# Patient Record
Sex: Female | Born: 1945 | Race: White | Hispanic: No | Smoking: Never smoker
Health system: Southern US, Community
[De-identification: ages and names within clinical notes are randomized; demographics above are authoritative.]

## PROBLEM LIST (undated history)

## (undated) DIAGNOSIS — G8929 Other chronic pain: Secondary | ICD-10-CM

## (undated) DIAGNOSIS — Z9889 Other specified postprocedural states: Secondary | ICD-10-CM

## (undated) DIAGNOSIS — Z8619 Personal history of other infectious and parasitic diseases: Secondary | ICD-10-CM

## (undated) DIAGNOSIS — M810 Age-related osteoporosis without current pathological fracture: Secondary | ICD-10-CM

## (undated) DIAGNOSIS — R351 Nocturia: Secondary | ICD-10-CM

## (undated) DIAGNOSIS — K219 Gastro-esophageal reflux disease without esophagitis: Secondary | ICD-10-CM

## (undated) DIAGNOSIS — R350 Frequency of micturition: Secondary | ICD-10-CM

## (undated) DIAGNOSIS — J449 Chronic obstructive pulmonary disease, unspecified: Secondary | ICD-10-CM

## (undated) DIAGNOSIS — T4145XA Adverse effect of unspecified anesthetic, initial encounter: Secondary | ICD-10-CM

## (undated) DIAGNOSIS — N390 Urinary tract infection, site not specified: Secondary | ICD-10-CM

## (undated) DIAGNOSIS — M2550 Pain in unspecified joint: Secondary | ICD-10-CM

## (undated) DIAGNOSIS — IMO0001 Reserved for inherently not codable concepts without codable children: Secondary | ICD-10-CM

## (undated) DIAGNOSIS — R112 Nausea with vomiting, unspecified: Secondary | ICD-10-CM

## (undated) DIAGNOSIS — M2540 Effusion, unspecified joint: Secondary | ICD-10-CM

## (undated) DIAGNOSIS — J302 Other seasonal allergic rhinitis: Secondary | ICD-10-CM

## (undated) DIAGNOSIS — J189 Pneumonia, unspecified organism: Secondary | ICD-10-CM

## (undated) DIAGNOSIS — I10 Essential (primary) hypertension: Secondary | ICD-10-CM

## (undated) DIAGNOSIS — M549 Dorsalgia, unspecified: Secondary | ICD-10-CM

## (undated) DIAGNOSIS — R0602 Shortness of breath: Secondary | ICD-10-CM

## (undated) DIAGNOSIS — Z5189 Encounter for other specified aftercare: Secondary | ICD-10-CM

## (undated) DIAGNOSIS — H9190 Unspecified hearing loss, unspecified ear: Secondary | ICD-10-CM

## (undated) DIAGNOSIS — M1990 Unspecified osteoarthritis, unspecified site: Secondary | ICD-10-CM

## (undated) DIAGNOSIS — R42 Dizziness and giddiness: Secondary | ICD-10-CM

## (undated) DIAGNOSIS — D219 Benign neoplasm of connective and other soft tissue, unspecified: Secondary | ICD-10-CM

## (undated) DIAGNOSIS — L853 Xerosis cutis: Secondary | ICD-10-CM

## (undated) DIAGNOSIS — T8859XA Other complications of anesthesia, initial encounter: Secondary | ICD-10-CM

## (undated) DIAGNOSIS — H269 Unspecified cataract: Secondary | ICD-10-CM

## (undated) DIAGNOSIS — E785 Hyperlipidemia, unspecified: Secondary | ICD-10-CM

---

## 1977-07-25 DIAGNOSIS — Z5189 Encounter for other specified aftercare: Secondary | ICD-10-CM

## 1977-07-25 DIAGNOSIS — IMO0001 Reserved for inherently not codable concepts without codable children: Secondary | ICD-10-CM

## 1977-07-25 HISTORY — DX: Reserved for inherently not codable concepts without codable children: IMO0001

## 1977-07-25 HISTORY — PX: DILATION AND CURETTAGE OF UTERUS: SHX78

## 1977-07-25 HISTORY — DX: Encounter for other specified aftercare: Z51.89

## 2000-01-28 ENCOUNTER — Other Ambulatory Visit: Admission: RE | Admit: 2000-01-28 | Discharge: 2000-01-28 | Payer: Self-pay | Admitting: Family Medicine

## 2001-08-14 ENCOUNTER — Other Ambulatory Visit: Admission: RE | Admit: 2001-08-14 | Discharge: 2001-08-14 | Payer: Self-pay | Admitting: Family Medicine

## 2010-12-24 HISTORY — PX: BACK SURGERY: SHX140

## 2010-12-31 ENCOUNTER — Encounter (HOSPITAL_COMMUNITY)
Admission: RE | Admit: 2010-12-31 | Discharge: 2010-12-31 | Disposition: A | Discharge status: Home / Self Care | Payer: 59 | Source: Ambulatory Visit | Attending: Neurosurgery | Admitting: Neurosurgery

## 2010-12-31 ENCOUNTER — Other Ambulatory Visit (HOSPITAL_COMMUNITY): Payer: Self-pay | Admitting: Neurosurgery

## 2010-12-31 DIAGNOSIS — M545 Low back pain, unspecified: Secondary | ICD-10-CM

## 2010-12-31 DIAGNOSIS — M5126 Other intervertebral disc displacement, lumbar region: Secondary | ICD-10-CM

## 2010-12-31 LAB — BASIC METABOLIC PANEL
Calcium: 9.5 mg/dL (ref 8.4–10.5)
GFR calc Af Amer: >60 mL/min (ref >60)
GFR calc non Af Amer: >60 mL/min (ref >60)
Potassium: 4.1 mEq/L (ref 3.5–5.1)
Sodium: 138 mEq/L (ref 135–145)

## 2010-12-31 LAB — SURGICAL PCR SCREEN: Staphylococcus aureus: NEGATIVE

## 2010-12-31 LAB — CBC
MCHC: 33.7 g/dL (ref 30.0–36.0)
RDW: 12.9 % (ref 11.5–15.5)

## 2011-01-04 ENCOUNTER — Ambulatory Visit (HOSPITAL_COMMUNITY): Payer: 59

## 2011-01-04 ENCOUNTER — Inpatient Hospital Stay (HOSPITAL_COMMUNITY)
Admission: RE | Admit: 2011-01-04 | Discharge: 2011-01-05 | DRG: 491 | Disposition: A | Discharge status: Home / Self Care | Payer: 59 | Source: Ambulatory Visit | Attending: Neurosurgery | Admitting: Neurosurgery

## 2011-01-04 DIAGNOSIS — M5126 Other intervertebral disc displacement, lumbar region: Principal | ICD-10-CM | POA: Diagnosis present

## 2011-01-04 DIAGNOSIS — K219 Gastro-esophageal reflux disease without esophagitis: Secondary | ICD-10-CM | POA: Diagnosis present

## 2011-01-04 DIAGNOSIS — I10 Essential (primary) hypertension: Secondary | ICD-10-CM | POA: Diagnosis present

## 2011-01-04 DIAGNOSIS — M216X9 Other acquired deformities of unspecified foot: Secondary | ICD-10-CM | POA: Diagnosis present

## 2011-01-04 DIAGNOSIS — J45909 Unspecified asthma, uncomplicated: Secondary | ICD-10-CM | POA: Diagnosis present

## 2011-01-04 DIAGNOSIS — E119 Type 2 diabetes mellitus without complications: Secondary | ICD-10-CM | POA: Diagnosis present

## 2011-01-04 DIAGNOSIS — E7800 Pure hypercholesterolemia, unspecified: Secondary | ICD-10-CM | POA: Diagnosis present

## 2011-01-04 DIAGNOSIS — Z01818 Encounter for other preprocedural examination: Secondary | ICD-10-CM

## 2011-01-04 LAB — GLUCOSE, CAPILLARY
Glucose-Capillary: 227 mg/dL — ABNORMAL HIGH (ref 70–99)
Glucose-Capillary: 219 mg/dL — ABNORMAL HIGH (ref 70–99)

## 2011-01-05 NOTE — Op Note (Signed)
  NAMEABILENE, Natalie Anderson                 ACCOUNT NO.:  [id number]  MEDICAL RECORD NO.:  [id number]  LOCATION:                                 FACILITY:  PHYSICIAN:  Hilda Lias, M.D.   DATE OF BIRTH:  1945/09/22  DATE OF PROCEDURE:  01/04/2011 DATE OF DISCHARGE:                              OPERATIVE REPORT   PREOPERATIVE DIAGNOSES:  Right L4-5 herniated disk with a footdrop, chronic obstructive pulmonary disease, diabetes.  POSTOPERATIVE DIAGNOSIS:  Right L4-5 herniated disk with a footdrop, chronic obstructive pulmonary disease, diabetes.  PROCEDURE:  Right L4-5 diskectomy, removal of large fragment, decompression of the L4-L5 nerve root, foraminotomy, microscope.  SURGEON:  Hilda Lias, MD  ASSISTANT:  Danae Orleans. Venetia Maxon, MD  CLINICAL HISTORY:  The patient is a 65 year old female complaining of back and right leg pain.  At this point, she had been followed for several weeks.  By the time I saw her, her pain was almost 0-1/5.  She declined surgery because her husband was in the ICU.  Now he is out of the ICU and she decided to go with surgery.  She and her niece knew the risks including the possibility of no improvement.  PROCEDURE:  The patient was taken to the OR after intubation.  She was positioned in prone manner.  The back was cleaned with DuraPrep. Midline incision from L4-L5 was made and muscle retracted laterally.  X- rays showed that indeed we were at the level of L4-5.  We brought the microscope and we drilled the lower lamina of L4 with the help of __ the microscope____.  We were able to find the thecal sac.  Retraction was done. Indeed there was a large herniated disk with extension into the foramen. The L5 nerve root was flat and pale.  The only way to get into the disk up into the fragment was to get into the disk space doing a total diskectomy.  From then on, we were able to retract the thecal sac as well as the L5 nerve root.  There were 2 large fragments  going to both L5 into the foramen.  Decompression was achieved at the end.  We had plenty of room for the thecal sac for the L4 and L5 nerve root.  The area was irrigated.  Fentanyl and Depo-Medrol were left in the intrathecal space and the wound was closed with Vicryl and Steri-Strips.          ______________________________ Hilda Lias, M.D.    EB/MEDQ  D:  01/04/2011  T:  01/05/2011  Job:  [id number]  Electronically Signed by Hilda Lias M.D. on 01/05/2011 03:52:45 PM

## 2011-04-29 ENCOUNTER — Telehealth (HOSPITAL_COMMUNITY): Payer: Self-pay

## 2011-04-29 NOTE — Telephone Encounter (Signed)
error 

## 2011-09-27 ENCOUNTER — Other Ambulatory Visit: Payer: Self-pay | Admitting: Neurosurgery

## 2011-09-27 DIAGNOSIS — M5410 Radiculopathy, site unspecified: Secondary | ICD-10-CM

## 2011-09-27 DIAGNOSIS — M549 Dorsalgia, unspecified: Secondary | ICD-10-CM

## 2011-10-04 ENCOUNTER — Ambulatory Visit
Admission: RE | Admit: 2011-10-04 | Discharge: 2011-10-04 | Disposition: A | Discharge status: Home / Self Care | Payer: Medicare Other | Source: Ambulatory Visit | Attending: Neurosurgery | Admitting: Neurosurgery

## 2011-10-04 DIAGNOSIS — M5410 Radiculopathy, site unspecified: Secondary | ICD-10-CM

## 2011-10-04 DIAGNOSIS — M549 Dorsalgia, unspecified: Secondary | ICD-10-CM

## 2011-10-04 MED ORDER — DIAZEPAM 5 MG PO TABS
5.0000 mg | ORAL_TABLET | Freq: Once | ORAL | Status: AC
  Administered 2011-10-04: 5 mg via ORAL

## 2011-10-04 MED ORDER — IOHEXOL 180 MG/ML  SOLN
16.0000 mL | Freq: Once | INTRAMUSCULAR | Status: AC | PRN
  Administered 2011-10-04: 16 mL via INTRATHECAL

## 2011-10-04 MED ORDER — HYDROMORPHONE HCL PF 1 MG/ML IJ SOLN
1.0000 mg | Freq: Once | INTRAMUSCULAR | Status: AC
  Administered 2011-10-04: 1 mg via INTRAMUSCULAR

## 2011-10-04 MED ORDER — ONDANSETRON HCL 4 MG/2ML IJ SOLN: 4.0000 mg | Freq: Four times a day (QID) | INTRAMUSCULAR | Status: DC | PRN

## 2011-10-04 MED ORDER — ONDANSETRON HCL 4 MG/2ML IJ SOLN
4.0000 mg | Freq: Once | INTRAMUSCULAR | Status: AC
  Administered 2011-10-04: 4 mg via INTRAMUSCULAR

## 2011-10-04 NOTE — Discharge Instructions (Signed)
Myelogram Discharge Instructions  1. Go home and rest quietly for the next 24 hours.  It is important to lie flat for the next 24 hours.  Get up only to go to the restroom.  You may lie in the bed or on a couch on your back, your stomach, your left side or your right side.  You may have one pillow under your head.  You may have pillows between your knees while you are on your side or under your knees while you are on your back.  2. DO NOT drive today.  Recline the seat as far back as it will go, while still wearing your seat belt, on the way home.  3. You may get up to go to the bathroom as needed.  You may sit up for 10 minutes to eat.  You may resume your normal diet and medications unless otherwise indicated.  Drink lots of extra fluids today and tomorrow.  4. The incidence of headache, nausea, or vomiting is about 5% (one in 20 patients).  If you develop a headache, lie flat and drink plenty of fluids until the headache goes away.  Caffeinated beverages may be helpful.  If you develop severe nausea and vomiting or a headache that does not go away with flat bed rest, call [phone].  5. You may resume normal activities after your 24 hours of bed rest is over; however, do not exert yourself strongly or do any heavy lifting tomorrow.  6. Call your physician for a follow-up appointment.  The results of your myelogram will be sent directly to your physician by the following day.  7. If you have any questions or if complications develop after you arrive home, please call [phone].  Discharge instructions have been explained to the patient.  The patient, or the person responsible for the patient, fully understands these instructions.    May resume tramadol on October 05, 2011, after 9:30 am.

## 2011-10-04 NOTE — Progress Notes (Signed)
Pt has been off tramadol for the past 2 days. Explained discharge instructions, consent signed and valium given.

## 2011-10-04 NOTE — Progress Notes (Signed)
Patient states "pain shot helped a ton!"  States pain down to a 6 from 10.  Resting quietly on right side on stretcher sipping soda.  jkl

## 2011-10-04 NOTE — Progress Notes (Signed)
States she has been off Tramadol the past two days.  jkl

## 2011-10-17 ENCOUNTER — Other Ambulatory Visit: Payer: Self-pay | Admitting: Neurosurgery

## 2011-10-24 ENCOUNTER — Encounter (HOSPITAL_COMMUNITY): Payer: Self-pay | Admitting: Respiratory Therapy

## 2011-10-28 ENCOUNTER — Encounter (HOSPITAL_COMMUNITY)
Admission: RE | Admit: 2011-10-28 | Discharge: 2011-10-28 | Disposition: A | Discharge status: Home / Self Care | Payer: Medicare Other | Source: Ambulatory Visit | Attending: Neurosurgery | Admitting: Neurosurgery

## 2011-10-28 ENCOUNTER — Encounter (HOSPITAL_COMMUNITY): Payer: Self-pay

## 2011-10-28 HISTORY — DX: Urinary tract infection, site not specified: N39.0

## 2011-10-28 HISTORY — DX: Gastro-esophageal reflux disease without esophagitis: K21.9

## 2011-10-28 HISTORY — DX: Other seasonal allergic rhinitis: J30.2

## 2011-10-28 HISTORY — DX: Nocturia: R35.1

## 2011-10-28 HISTORY — DX: Unspecified osteoarthritis, unspecified site: M19.90

## 2011-10-28 HISTORY — DX: Encounter for other specified aftercare: Z51.89

## 2011-10-28 HISTORY — DX: Pain in unspecified joint: M25.50

## 2011-10-28 HISTORY — DX: Benign neoplasm of connective and other soft tissue, unspecified: D21.9

## 2011-10-28 HISTORY — DX: Xerosis cutis: L85.3

## 2011-10-28 HISTORY — DX: Unspecified hearing loss, unspecified ear: H91.90

## 2011-10-28 HISTORY — DX: Unspecified cataract: H26.9

## 2011-10-28 HISTORY — DX: Other chronic pain: G89.29

## 2011-10-28 HISTORY — DX: Dizziness and giddiness: R42

## 2011-10-28 HISTORY — DX: Reserved for inherently not codable concepts without codable children: IMO0001

## 2011-10-28 HISTORY — DX: Chronic obstructive pulmonary disease, unspecified: J44.9

## 2011-10-28 HISTORY — DX: Pneumonia, unspecified organism: J18.9

## 2011-10-28 HISTORY — DX: Personal history of other infectious and parasitic diseases: Z86.19

## 2011-10-28 HISTORY — DX: Shortness of breath: R06.02

## 2011-10-28 HISTORY — DX: Essential (primary) hypertension: I10

## 2011-10-28 HISTORY — DX: Effusion, unspecified joint: M25.40

## 2011-10-28 HISTORY — DX: Other complications of anesthesia, initial encounter: T88.59XA

## 2011-10-28 HISTORY — DX: Dorsalgia, unspecified: M54.9

## 2011-10-28 HISTORY — DX: Frequency of micturition: R35.0

## 2011-10-28 HISTORY — DX: Adverse effect of unspecified anesthetic, initial encounter: T41.45XA

## 2011-10-28 HISTORY — DX: Hyperlipidemia, unspecified: E78.5

## 2011-10-28 LAB — CBC
HCT: 40.6 % (ref 36.0–46.0)
Hemoglobin: 13.8 g/dL (ref 12.0–15.0)
RBC: 5.06 MIL/uL (ref 3.87–5.11)
WBC: 7.1 10*3/uL (ref 4.0–10.5)

## 2011-10-28 LAB — BASIC METABOLIC PANEL
BUN: 10 mg/dL (ref 6–23)
Chloride: 102 mEq/L (ref 96–112)
GFR calc Af Amer: >90 mL/min (ref >90)
Potassium: 4.5 mEq/L (ref 3.5–5.1)
Sodium: 139 mEq/L (ref 135–145)

## 2011-10-28 LAB — TYPE AND SCREEN
ABO/RH(D): O POS
Antibody Screen: NEGATIVE

## 2011-10-28 LAB — SURGICAL PCR SCREEN
MRSA, PCR: NEGATIVE
Staphylococcus aureus: NEGATIVE

## 2011-10-28 LAB — ABO/RH: ABO/RH(D): O POS

## 2011-10-28 NOTE — Progress Notes (Signed)
Pt doesn't have a cardiologist  Stress test done 3-55yrs ago-at Dr.Shah([facility])-maintains HTN/hyperlipidemia  Denies ever having an echo/heart catherization

## 2011-10-28 NOTE — Progress Notes (Signed)
Average fasting sugar runs around 80

## 2011-10-28 NOTE — Pre-Procedure Instructions (Signed)
[address] Natalie Anderson  10/28/2011   Your procedure is scheduled on: Wed, April 10 @ 1105 AM  Report to [facility] at 0800 AM.  Call this number if you have problems the morning of surgery: [phone]   Remember:   Do not eat food:After Midnight.  May have clear liquids: up to 4 Hours before arrival.(until 4:00 am)  Clear liquids include soda, tea, black coffee, apple or grape juice, broth,water  Take these medicines the morning of surgery with A SIP OF WATER: Protonix,Claritin,and Advair<Bring Your Inhaler With You>,and Neurontin(if needed)  Do not wear jewelry, make-up or nail polish.  Do not wear lotions, powders, or perfumes.   Do not shave 48 hours prior to surgery.  Do not bring valuables to the hospital.  Contacts, dentures or bridgework may not be worn into surgery.  Leave suitcase in the car. After surgery it may be brought to your room.  For patients admitted to the hospital, checkout time is 11:00 AM the day of discharge.   Special Instructions: CHG Shower Use Special Wash: 1/2 bottle night before surgery and 1/2 bottle morning of surgery.   Please read over the following fact sheets that you were given: Pain Booklet, Coughing and Deep Breathing, Blood Transfusion Information, MRSA Information and Surgical Site Infection Prevention

## 2011-10-31 NOTE — Progress Notes (Signed)
Spoke with Natalie Anderson at Dr. Doristine Counter record of stress test, no record in EPIC or Echart of stress test. No record of one at [facility] per Diane.

## 2011-11-01 MED ORDER — CEFAZOLIN SODIUM 1-5 GM-% IV SOLN
1.0000 g | INTRAVENOUS | Status: AC
  Administered 2011-11-02: 1 g via INTRAVENOUS
  Filled 2011-11-01: qty 50

## 2011-11-02 ENCOUNTER — Inpatient Hospital Stay (HOSPITAL_COMMUNITY): Payer: Medicare Other

## 2011-11-02 ENCOUNTER — Encounter (HOSPITAL_COMMUNITY): Payer: Self-pay | Admitting: *Deleted

## 2011-11-02 ENCOUNTER — Inpatient Hospital Stay (HOSPITAL_COMMUNITY)
Admission: RE | Admit: 2011-11-02 | Discharge: 2011-11-04 | DRG: 460 | Disposition: A | Discharge status: Home Health | Payer: Medicare Other | Source: Ambulatory Visit | Attending: Neurosurgery | Admitting: Neurosurgery

## 2011-11-02 ENCOUNTER — Encounter (HOSPITAL_COMMUNITY)
Admission: RE | Disposition: A | Discharge status: Home Health | Payer: Self-pay | Source: Ambulatory Visit | Attending: Neurosurgery

## 2011-11-02 ENCOUNTER — Encounter (HOSPITAL_COMMUNITY): Payer: Self-pay | Admitting: Anesthesiology

## 2011-11-02 ENCOUNTER — Inpatient Hospital Stay (HOSPITAL_COMMUNITY): Payer: Medicare Other | Admitting: Anesthesiology

## 2011-11-02 DIAGNOSIS — G43909 Migraine, unspecified, not intractable, without status migrainosus: Secondary | ICD-10-CM | POA: Diagnosis present

## 2011-11-02 DIAGNOSIS — Z79899 Other long term (current) drug therapy: Secondary | ICD-10-CM

## 2011-11-02 DIAGNOSIS — M5137 Other intervertebral disc degeneration, lumbosacral region: Principal | ICD-10-CM | POA: Diagnosis present

## 2011-11-02 DIAGNOSIS — Z87891 Personal history of nicotine dependence: Secondary | ICD-10-CM

## 2011-11-02 DIAGNOSIS — J449 Chronic obstructive pulmonary disease, unspecified: Secondary | ICD-10-CM | POA: Diagnosis present

## 2011-11-02 DIAGNOSIS — Z888 Allergy status to other drugs, medicaments and biological substances status: Secondary | ICD-10-CM

## 2011-11-02 DIAGNOSIS — E785 Hyperlipidemia, unspecified: Secondary | ICD-10-CM | POA: Diagnosis present

## 2011-11-02 DIAGNOSIS — Z9109 Other allergy status, other than to drugs and biological substances: Secondary | ICD-10-CM

## 2011-11-02 DIAGNOSIS — M51379 Other intervertebral disc degeneration, lumbosacral region without mention of lumbar back pain or lower extremity pain: Principal | ICD-10-CM | POA: Diagnosis present

## 2011-11-02 DIAGNOSIS — K219 Gastro-esophageal reflux disease without esophagitis: Secondary | ICD-10-CM | POA: Diagnosis present

## 2011-11-02 DIAGNOSIS — J4489 Other specified chronic obstructive pulmonary disease: Secondary | ICD-10-CM | POA: Diagnosis present

## 2011-11-02 DIAGNOSIS — E119 Type 2 diabetes mellitus without complications: Secondary | ICD-10-CM | POA: Diagnosis present

## 2011-11-02 DIAGNOSIS — I10 Essential (primary) hypertension: Secondary | ICD-10-CM | POA: Diagnosis present

## 2011-11-02 DIAGNOSIS — M810 Age-related osteoporosis without current pathological fracture: Secondary | ICD-10-CM | POA: Diagnosis present

## 2011-11-02 DIAGNOSIS — Z981 Arthrodesis status: Secondary | ICD-10-CM

## 2011-11-02 DIAGNOSIS — Z01812 Encounter for preprocedural laboratory examination: Secondary | ICD-10-CM

## 2011-11-02 DIAGNOSIS — Z794 Long term (current) use of insulin: Secondary | ICD-10-CM

## 2011-11-02 DIAGNOSIS — E559 Vitamin D deficiency, unspecified: Secondary | ICD-10-CM | POA: Diagnosis present

## 2011-11-02 DIAGNOSIS — Z91040 Latex allergy status: Secondary | ICD-10-CM

## 2011-11-02 DIAGNOSIS — Z91018 Allergy to other foods: Secondary | ICD-10-CM

## 2011-11-02 DIAGNOSIS — G96198 Other disorders of meninges, not elsewhere classified: Secondary | ICD-10-CM | POA: Diagnosis present

## 2011-11-02 LAB — GLUCOSE, CAPILLARY: Glucose-Capillary: 232 mg/dL — ABNORMAL HIGH (ref 70–99)

## 2011-11-02 SURGERY — POSTERIOR LUMBAR FUSION 1 LEVEL
Anesthesia: General | Site: Back | Wound class: Clean

## 2011-11-02 MED ORDER — ROCURONIUM BROMIDE 100 MG/10ML IV SOLN
INTRAVENOUS | Status: DC | PRN
  Administered 2011-11-02: 20 mg via INTRAVENOUS
  Administered 2011-11-02: 5 mg via INTRAVENOUS
  Administered 2011-11-02: 50 mg via INTRAVENOUS

## 2011-11-02 MED ORDER — SODIUM CHLORIDE 0.9 % IJ SOLN: 3.0000 mL | INTRAMUSCULAR | Status: DC | PRN

## 2011-11-02 MED ORDER — LORATADINE 10 MG PO TABS
10.0000 mg | ORAL_TABLET | Freq: Every day | ORAL | Status: DC
  Administered 2011-11-02 – 2011-11-04 (×3): 10 mg via ORAL
  Filled 2011-11-02 (×3): qty 1

## 2011-11-02 MED ORDER — LIDOCAINE HCL (CARDIAC) 20 MG/ML IV SOLN
INTRAVENOUS | Status: DC | PRN
  Administered 2011-11-02: 60 mg via INTRAVENOUS

## 2011-11-02 MED ORDER — ZOLPIDEM TARTRATE 5 MG PO TABS: 5.0000 mg | ORAL_TABLET | Freq: Every evening | ORAL | Status: DC | PRN

## 2011-11-02 MED ORDER — SODIUM CHLORIDE 0.9 % IV SOLN
INTRAVENOUS | Status: DC
  Administered 2011-11-02 – 2011-11-03 (×2): via INTRAVENOUS

## 2011-11-02 MED ORDER — FLUTICASONE-SALMETEROL 500-50 MCG/DOSE IN AEPB
1.0000 | INHALATION_SPRAY | Freq: Two times a day (BID) | RESPIRATORY_TRACT | Status: DC
  Filled 2011-11-02: qty 14

## 2011-11-02 MED ORDER — FENTANYL CITRATE 0.05 MG/ML IJ SOLN
25.0000 ug | INTRAMUSCULAR | Status: DC | PRN
  Administered 2011-11-02: 50 ug via INTRAVENOUS

## 2011-11-02 MED ORDER — MENTHOL 3 MG MT LOZG: 1.0000 | LOZENGE | OROMUCOSAL | Status: DC | PRN

## 2011-11-02 MED ORDER — INSULIN DETEMIR 100 UNIT/ML ~~LOC~~ SOLN
25.0000 [IU] | Freq: Every day | SUBCUTANEOUS | Status: DC
  Administered 2011-11-03: 35 [IU] via SUBCUTANEOUS

## 2011-11-02 MED ORDER — SUFENTANIL CITRATE 50 MCG/ML IV SOLN
INTRAVENOUS | Status: DC | PRN
  Administered 2011-11-02 (×2): 5 ug via INTRAVENOUS
  Administered 2011-11-02 (×2): 15 ug via INTRAVENOUS

## 2011-11-02 MED ORDER — PANTOPRAZOLE SODIUM 40 MG PO TBEC
40.0000 mg | DELAYED_RELEASE_TABLET | Freq: Every day | ORAL | Status: DC
  Administered 2011-11-02 – 2011-11-04 (×3): 40 mg via ORAL
  Filled 2011-11-02 (×2): qty 1

## 2011-11-02 MED ORDER — LACTATED RINGERS IV SOLN
INTRAVENOUS | Status: DC | PRN
  Administered 2011-11-02 (×3): via INTRAVENOUS

## 2011-11-02 MED ORDER — INSULIN DETEMIR 100 UNIT/ML ~~LOC~~ SOLN: 25.0000 [IU] | Freq: Every day | SUBCUTANEOUS | Status: DC

## 2011-11-02 MED ORDER — PROMETHAZINE HCL 25 MG/ML IJ SOLN: 6.2500 mg | INTRAMUSCULAR | Status: DC | PRN

## 2011-11-02 MED ORDER — FLUTICASONE-SALMETEROL 500-50 MCG/DOSE IN AEPB
1.0000 | INHALATION_SPRAY | Freq: Two times a day (BID) | RESPIRATORY_TRACT | Status: DC
  Administered 2011-11-03 – 2011-11-04 (×3): 1 via RESPIRATORY_TRACT
  Filled 2011-11-02: qty 14

## 2011-11-02 MED ORDER — PHENOL 1.4 % MT LIQD: 1.0000 | OROMUCOSAL | Status: DC | PRN

## 2011-11-02 MED ORDER — PROPOFOL 10 MG/ML IV EMUL
INTRAVENOUS | Status: DC | PRN
  Administered 2011-11-02: 130 mg via INTRAVENOUS

## 2011-11-02 MED ORDER — DIAZEPAM 5 MG PO TABS
5.0000 mg | ORAL_TABLET | Freq: Four times a day (QID) | ORAL | Status: DC | PRN
  Administered 2011-11-03 – 2011-11-04 (×4): 5 mg via ORAL
  Filled 2011-11-02 (×4): qty 1

## 2011-11-02 MED ORDER — GLYCOPYRROLATE 0.2 MG/ML IJ SOLN
INTRAMUSCULAR | Status: DC | PRN
  Administered 2011-11-02: 0.6 mg via INTRAVENOUS

## 2011-11-02 MED ORDER — THROMBIN 20000 UNITS EX KIT
PACK | CUTANEOUS | Status: DC | PRN
  Administered 2011-11-02: 11:00:00 via TOPICAL

## 2011-11-02 MED ORDER — OXYCODONE-ACETAMINOPHEN 5-325 MG PO TABS
1.0000 | ORAL_TABLET | ORAL | Status: DC | PRN
  Administered 2011-11-02 – 2011-11-04 (×9): 2 via ORAL
  Filled 2011-11-02 (×9): qty 2

## 2011-11-02 MED ORDER — BUPIVACAINE-EPINEPHRINE PF 0.5-1:200000 % IJ SOLN
INTRAMUSCULAR | Status: DC | PRN
  Administered 2011-11-02: 20 mL

## 2011-11-02 MED ORDER — OLMESARTAN MEDOXOMIL 20 MG PO TABS
20.0000 mg | ORAL_TABLET | Freq: Every day | ORAL | Status: DC
  Administered 2011-11-02 – 2011-11-04 (×3): 20 mg via ORAL
  Filled 2011-11-02 (×3): qty 1

## 2011-11-02 MED ORDER — DROPERIDOL 2.5 MG/ML IJ SOLN
INTRAMUSCULAR | Status: DC | PRN
  Administered 2011-11-02: 0.625 mg via INTRAVENOUS

## 2011-11-02 MED ORDER — MIDAZOLAM HCL 2 MG/2ML IJ SOLN: 0.5000 mg | Freq: Once | INTRAMUSCULAR | Status: DC | PRN

## 2011-11-02 MED ORDER — 0.9 % SODIUM CHLORIDE (POUR BTL) OPTIME
TOPICAL | Status: DC | PRN
  Administered 2011-11-02: 1000 mL

## 2011-11-02 MED ORDER — ONDANSETRON HCL 4 MG/2ML IJ SOLN
INTRAMUSCULAR | Status: DC | PRN
  Administered 2011-11-02: 4 mg via INTRAVENOUS

## 2011-11-02 MED ORDER — INSULIN ASPART 100 UNIT/ML ~~LOC~~ SOLN
0.0000 [IU] | Freq: Three times a day (TID) | SUBCUTANEOUS | Status: DC
  Administered 2011-11-03: 3 [IU] via SUBCUTANEOUS
  Administered 2011-11-03: 2 [IU] via SUBCUTANEOUS
  Administered 2011-11-03: 9 [IU] via SUBCUTANEOUS
  Administered 2011-11-04: 1 [IU] via SUBCUTANEOUS
  Administered 2011-11-04: 2 [IU] via SUBCUTANEOUS

## 2011-11-02 MED ORDER — LABETALOL HCL 5 MG/ML IV SOLN
INTRAVENOUS | Status: DC | PRN
  Administered 2011-11-02: 5 mg via INTRAVENOUS

## 2011-11-02 MED ORDER — METFORMIN HCL 500 MG PO TABS
500.0000 mg | ORAL_TABLET | Freq: Two times a day (BID) | ORAL | Status: DC
  Administered 2011-11-02 – 2011-11-04 (×4): 500 mg via ORAL
  Filled 2011-11-02 (×6): qty 1

## 2011-11-02 MED ORDER — NEOSTIGMINE METHYLSULFATE 1 MG/ML IJ SOLN
INTRAMUSCULAR | Status: DC | PRN
  Administered 2011-11-02: 5 mg via INTRAVENOUS

## 2011-11-02 MED ORDER — ACETAMINOPHEN 325 MG PO TABS: 325.0000 mg | ORAL_TABLET | ORAL | Status: DC | PRN

## 2011-11-02 MED ORDER — SODIUM CHLORIDE 0.9 % IJ SOLN
3.0000 mL | Freq: Two times a day (BID) | INTRAMUSCULAR | Status: DC
  Administered 2011-11-03 – 2011-11-04 (×3): 3 mL via INTRAVENOUS

## 2011-11-02 MED ORDER — MORPHINE SULFATE 4 MG/ML IJ SOLN: 4.0000 mg | INTRAMUSCULAR | Status: DC | PRN

## 2011-11-02 MED ORDER — ACETAMINOPHEN 325 MG PO TABS: 650.0000 mg | ORAL_TABLET | ORAL | Status: DC | PRN

## 2011-11-02 MED ORDER — INSULIN ASPART 100 UNIT/ML ~~LOC~~ SOLN: 10.0000 [IU] | Freq: Every day | SUBCUTANEOUS | Status: DC

## 2011-11-02 MED ORDER — CEFAZOLIN SODIUM 1-5 GM-% IV SOLN
1.0000 g | Freq: Three times a day (TID) | INTRAVENOUS | Status: AC
  Administered 2011-11-02 – 2011-11-03 (×2): 1 g via INTRAVENOUS
  Filled 2011-11-02 (×3): qty 50

## 2011-11-02 MED ORDER — ACETAMINOPHEN 650 MG RE SUPP: 650.0000 mg | RECTAL | Status: DC | PRN

## 2011-11-02 MED ORDER — FENTANYL CITRATE 0.05 MG/ML IJ SOLN
INTRAMUSCULAR | Status: AC
  Filled 2011-11-02: qty 2

## 2011-11-02 MED ORDER — GABAPENTIN 300 MG PO CAPS
300.0000 mg | ORAL_CAPSULE | Freq: Three times a day (TID) | ORAL | Status: DC
  Administered 2011-11-02 – 2011-11-04 (×6): 300 mg via ORAL
  Filled 2011-11-02 (×8): qty 1

## 2011-11-02 MED ORDER — INSULIN DETEMIR 100 UNIT/ML ~~LOC~~ SOLN
35.0000 [IU] | Freq: Every day | SUBCUTANEOUS | Status: DC
  Administered 2011-11-02: 35 [IU] via SUBCUTANEOUS
  Filled 2011-11-02: qty 10

## 2011-11-02 MED ORDER — MEPERIDINE HCL 25 MG/ML IJ SOLN: 6.2500 mg | INTRAMUSCULAR | Status: DC | PRN

## 2011-11-02 MED ORDER — ONDANSETRON HCL 4 MG/2ML IJ SOLN
4.0000 mg | INTRAMUSCULAR | Status: DC | PRN
  Administered 2011-11-04: 4 mg via INTRAVENOUS
  Filled 2011-11-02: qty 2

## 2011-11-02 MED ORDER — HEMOSTATIC AGENTS (NO CHARGE) OPTIME
TOPICAL | Status: DC | PRN
  Administered 2011-11-02: 1 via TOPICAL

## 2011-11-02 MED ORDER — ATORVASTATIN CALCIUM 10 MG PO TABS
10.0000 mg | ORAL_TABLET | ORAL | Status: DC
  Administered 2011-11-03: 10 mg via ORAL
  Filled 2011-11-02: qty 1

## 2011-11-02 MED ORDER — SODIUM CHLORIDE 0.9 % IV SOLN: 250.0000 mL | INTRAVENOUS | Status: DC

## 2011-11-02 MED ORDER — PHENYLEPHRINE HCL 10 MG/ML IJ SOLN
10.0000 mg | INTRAVENOUS | Status: DC | PRN
  Administered 2011-11-02: 20 ug/min via INTRAVENOUS

## 2011-11-02 SURGICAL SUPPLY — 75 items
BENZOIN TINCTURE PRP APPL 2/3 (GAUZE/BANDAGES/DRESSINGS) ×2 IMPLANT
BLADE SURG ROTATE 9660 (MISCELLANEOUS) IMPLANT
BUR ACORN 6.0 (BURR) ×2 IMPLANT
BUR MATCHSTICK NEURO 3.0 LAGG (BURR) ×2 IMPLANT
CANISTER SUCTION 2500CC (MISCELLANEOUS) ×2 IMPLANT
CAP REVERE LOCKING (Cap) ×8 IMPLANT
CLOTH BEACON ORANGE TIMEOUT ST (SAFETY) ×2 IMPLANT
CONT SPEC 4OZ CLIKSEAL STRL BL (MISCELLANEOUS) ×2 IMPLANT
CONT SPEC STER OR (MISCELLANEOUS) ×4 IMPLANT
COVER BACK TABLE 24X17X13 BIG (DRAPES) IMPLANT
COVER TABLE BACK 60X90 (DRAPES) ×2 IMPLANT
DRAPE C-ARM 42X72 X-RAY (DRAPES) ×4 IMPLANT
DRAPE LAPAROTOMY 100X72X124 (DRAPES) ×2 IMPLANT
DRAPE MICROSCOPE LEICA (MISCELLANEOUS) ×2 IMPLANT
DRAPE POUCH INSTRU U-SHP 10X18 (DRAPES) ×2 IMPLANT
DRSG PAD ABDOMINAL 8X10 ST (GAUZE/BANDAGES/DRESSINGS) IMPLANT
DURAPREP 26ML APPLICATOR (WOUND CARE) ×2 IMPLANT
ELECT REM PT RETURN 9FT ADLT (ELECTROSURGICAL) ×2
ELECTRODE REM PT RTRN 9FT ADLT (ELECTROSURGICAL) ×1 IMPLANT
EVACUATOR 1/8 PVC DRAIN (DRAIN) IMPLANT
EVACUATOR 3/16  PVC DRAIN (DRAIN) ×1
EVACUATOR 3/16 PVC DRAIN (DRAIN) ×1 IMPLANT
GAUZE SPONGE 4X4 16PLY XRAY LF (GAUZE/BANDAGES/DRESSINGS) ×2 IMPLANT
GLOVE BIOGEL M 8.0 STRL (GLOVE) IMPLANT
GLOVE BIOGEL PI IND STRL 6 (GLOVE) ×2 IMPLANT
GLOVE BIOGEL PI IND STRL 7.0 (GLOVE) ×4 IMPLANT
GLOVE BIOGEL PI IND STRL 8 (GLOVE) ×3 IMPLANT
GLOVE BIOGEL PI INDICATOR 6 (GLOVE) ×2
GLOVE BIOGEL PI INDICATOR 7.0 (GLOVE) ×4
GLOVE BIOGEL PI INDICATOR 8 (GLOVE) ×3
GLOVE EXAM NITRILE LRG STRL (GLOVE) IMPLANT
GLOVE EXAM NITRILE MD LF STRL (GLOVE) IMPLANT
GLOVE EXAM NITRILE XL STR (GLOVE) IMPLANT
GLOVE EXAM NITRILE XS STR PU (GLOVE) IMPLANT
GLOVE INDICATOR 7.0 STRL GRN (GLOVE) ×6 IMPLANT
GLOVE INDICATOR 7.5 STRL GRN (GLOVE) ×4 IMPLANT
GOWN BRE IMP SLV AUR LG STRL (GOWN DISPOSABLE) ×6 IMPLANT
GOWN BRE IMP SLV AUR XL STRL (GOWN DISPOSABLE) ×6 IMPLANT
GOWN STRL REIN 2XL LVL4 (GOWN DISPOSABLE) IMPLANT
KIT BASIN OR (CUSTOM PROCEDURE TRAY) ×2 IMPLANT
KIT ROOM TURNOVER OR (KITS) ×2 IMPLANT
MILL MEDIUM DISP (BLADE) ×2 IMPLANT
NEEDLE HYPO 18GX1.5 BLUNT FILL (NEEDLE) IMPLANT
NEEDLE HYPO 21X1.5 SAFETY (NEEDLE) IMPLANT
NEEDLE HYPO 25X1 1.5 SAFETY (NEEDLE) IMPLANT
NS IRRIG 1000ML POUR BTL (IV SOLUTION) ×2 IMPLANT
PACK FOAM VITOSS 10CC (Orthopedic Implant) ×2 IMPLANT
PACK LAMINECTOMY NEURO (CUSTOM PROCEDURE TRAY) ×2 IMPLANT
PAD ARMBOARD 7.5X6 YLW CONV (MISCELLANEOUS) ×6 IMPLANT
PATTIES SURGICAL .5 X1 (DISPOSABLE) ×2 IMPLANT
PATTIES SURGICAL .5 X3 (DISPOSABLE) IMPLANT
ROD REVERE 6.35 40MM (Rod) ×2 IMPLANT
ROD REVERE CURVED 6.35X35MM (Rod) ×2 IMPLANT
RUBBERBAND STERILE (MISCELLANEOUS) ×4 IMPLANT
SCREW REVERE 5.5X45 (Screw) ×2 IMPLANT
SCREW REVERE 6.35 5.5X40MM (Screw) ×8 IMPLANT
SCREW REVERE 6.35 6.5X35MM (Screw) ×2 IMPLANT
SPONGE GAUZE 4X4 12PLY (GAUZE/BANDAGES/DRESSINGS) ×2 IMPLANT
SPONGE LAP 4X18 X RAY DECT (DISPOSABLE) IMPLANT
SPONGE NEURO XRAY DETECT 1X3 (DISPOSABLE) IMPLANT
SPONGE SURGIFOAM ABS GEL 100 (HEMOSTASIS) ×2 IMPLANT
STRIP CLOSURE SKIN 1/2X4 (GAUZE/BANDAGES/DRESSINGS) ×2 IMPLANT
SUT VIC AB 1 CT1 18XBRD ANBCTR (SUTURE) ×2 IMPLANT
SUT VIC AB 1 CT1 8-18 (SUTURE) ×2
SUT VIC AB 2-0 CP2 18 (SUTURE) ×2 IMPLANT
SUT VIC AB 3-0 SH 8-18 (SUTURE) ×2 IMPLANT
SYR 20CC LL (SYRINGE) IMPLANT
SYR 20ML ECCENTRIC (SYRINGE) ×2 IMPLANT
SYR 5ML LL (SYRINGE) IMPLANT
TAPE CLOTH SURG 4X10 WHT LF (GAUZE/BANDAGES/DRESSINGS) ×2 IMPLANT
TOWEL OR 17X24 6PK STRL BLUE (TOWEL DISPOSABLE) ×2 IMPLANT
TOWEL OR 17X26 10 PK STRL BLUE (TOWEL DISPOSABLE) ×2 IMPLANT
TRAY FOLEY BAG SILVER LF 14FR (CATHETERS) ×2 IMPLANT
TRAY FOLEY CATH 14FRSI W/METER (CATHETERS) IMPLANT
WATER STERILE IRR 1000ML POUR (IV SOLUTION) ×2 IMPLANT

## 2011-11-02 NOTE — Progress Notes (Signed)
Operative note [id number]. l45 fusion

## 2011-11-02 NOTE — Anesthesia Postprocedure Evaluation (Signed)
Anesthesia Post Note  Patient: Natalie Anderson  Procedure(s) Performed: Procedure(s) (LRB): POSTERIOR LUMBAR FUSION 1 LEVEL (N/A)  Anesthesia type: GA  Patient location: PACU  Post pain: Pain level controlled  Post assessment: Post-op Vital signs reviewed  Last Vitals:  Filed Vitals:   11/02/11 1427  BP:   Pulse: 104  Temp:   Resp: 16    Post vital signs: Reviewed  Level of consciousness: sedated  Complications: No apparent anesthesia complications

## 2011-11-02 NOTE — Op Note (Signed)
Natalie Anderson, Natalie Anderson NO.:  [id number]  MEDICAL RECORD NO.:  [id number]  LOCATION:  3041                         FACILITY:  [facility]  PHYSICIAN:  Hilda Lias, M.D.   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  Diabetes mellitus.  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  Diabetes mellitus.  PROCEDURE:  L4 laminectomy and facetectomy, lysis of adhesion bilaterally to decompress the L4-L5 nerve root, pedicle screws at L4-L5, with posterolateral arthrodesis with Vitoss and autograft.  Microscope.  SURGEON:  Hilda Lias, MD  ASSISTANT:  Coletta Memos, MD.  CLINICAL HISTORY:  Natalie Anderson is a 66 year old female who in the past underwent surgery because of L4-5 herniated disk.  The patient did well. Bilaterally, she had been complaining of back pain radiation to both legs.  She has failed with conservative treatment.  X-ray shows severe degenerative disk disease at L4-5 with a step-off at that level.  In view of no improvement, surgery was advised.  The risks were fully explained to her including possibility of no improvement, infection, CSF leak, need of further surgery.  PROCEDURE:  The patient was taken to the OR and after intubation, she was positioned in a prone manner.  The skin was cleaned with DuraPrep. Drapes were applied.  Midline incision following the previous one was made through the skin and subcu tissue.  We retracted the muscle all the way laterally.  In the left side, the retraction was easy; but in the right side, the patient had quite a bit of fatty fibrosis. Nevertheless, after the retraction, x-rays showed that we were right at the level L4-5.  We  proceeded with removal of the spinous process of L4, the lamina, and the facet of L4.  Although, the patient had surgery on the right side, the patient had quite a bit of adhesion bilaterally. Lysis was accomplished.  Then, we identified the disk space.  The area was quite narrow up to the point that we were unable to introduce any instrument in that area including a surgical blade.  Because of that, we decided there was no way we can do any interbody fusion.  We brought the microscope in the area and decompression of the thecal sac as well as decompression of the L4 and L5 nerve root was done.  Then using the C- arm in the AP and lateral view, we probed the pedicle of L4 and L5.  The most difficult one was that the pedicle of L5 in the right side, which was quite osteoporotic.  At the end, we were able to introduce 4 pedicles 5.5 x 40 and one of 6.5 x 35 in the pedicles of right L5.  AP and lateral showed good position of the screws.  Then a rod was used to connect the pedicle screws and secured in place with Capps.  We went laterally and we removed the periosteum of the L4 and L5 lateral  aspect of the facet.  Then, a mix of autograft and Vitoss was used for arthrodesis.  Although, we accomplished good hemostasis, we decided to leave the Hemovac in the epidural area.  Then the wound was closed with Vicryl and Steri-Strips.          ______________________________ Hilda Lias, M.D.     EB/MEDQ  D:  11/02/2011  T:  11/02/2011  Job:  [id number]

## 2011-11-02 NOTE — Anesthesia Preprocedure Evaluation (Signed)
Anesthesia Evaluation  Patient identified by MRN, date of birth, ID band Patient awake    Reviewed: Allergy & Precautions, H&P , Patient's Chart, lab work & pertinent test results, reviewed documented beta blocker date and time   Airway Mallampati: II TM Distance: >3 FB Neck ROM: full    Dental No notable dental hx.    Pulmonary neg pulmonary ROS, shortness of breath, asthma , pneumonia , COPD breath sounds clear to auscultation  Pulmonary exam normal       Cardiovascular Exercise Tolerance: Good hypertension, negative cardio ROS  Rhythm:regular Rate:Normal     Neuro/Psych  Headaches, negative neurological ROS  negative psych ROS   GI/Hepatic negative GI ROS, Neg liver ROS, GERD-  ,  Endo/Other  negative endocrine ROSDiabetes mellitus-  Renal/GU negative Renal ROS     Musculoskeletal   Abdominal   Peds  Hematology negative hematology ROS (+)   Anesthesia Other Findings Asthma   as a child Complication of anesthesia        Hypertension   takes Losartan daily Hyperlipidemia   takes Lipitor occasionally    COPD (chronic obstructive pulmonary disease)     Shortness of breath   with exertion    Pneumonia   hx of-double as a child Seasonal allergies   takes Claritin daily    Migraine   last one 3 wks ago Dizziness   occasionally    Vertigo   hx of Arthritis        Joint pain     Joint swelling        Chronic back pain   spondylosis and stenosis Dry skin   and itchy     GERD (gastroesophageal reflux disease)   takes Protonix daily Urinary frequency        Nocturia     UTI (lower urinary tract infection)   hx of    Blood transfusion 1979   Vitamin d deficiency   takes Vit D 50,000units on Sat    Osteoporosis     Diabetes mellitus   takes Metformin bid and Levimir bid and Victoza daily    Cataract immature   bilateral Impaired hearing        History of shingles 15-91yrs ago   Fibroid tumor     Reproductive/Obstetrics negative OB ROS                           Anesthesia Physical Anesthesia Plan  ASA: III  Anesthesia Plan: General ETT   Post-op Pain Management:    Induction:   Airway Management Planned:   Additional Equipment:   Intra-op Plan:   Post-operative Plan:   Informed Consent: I have reviewed the patients History and Physical, chart, labs and discussed the procedure including the risks, benefits and alternatives for the proposed anesthesia with the patient or authorized representative who has indicated his/her understanding and acceptance.   Dental Advisory Given  Plan Discussed with: CRNA and Surgeon  Anesthesia Plan Comments:         Anesthesia Quick Evaluation

## 2011-11-02 NOTE — Transfer of Care (Signed)
Immediate Anesthesia Transfer of Care Note  Patient: Natalie Anderson  Procedure(s) Performed: Procedure(s) (LRB): POSTERIOR LUMBAR FUSION 1 LEVEL (N/A)  Patient Location: PACU  Anesthesia Type: General  Level of Consciousness: awake, alert  and sedated  Airway & Oxygen Therapy: Patient Spontanous Breathing and Patient connected to face mask oxygen  Post-op Assessment: Report given to PACU RN, Post -op Vital signs reviewed and stable, Patient moving all extremities and Patient moving all extremities X 4  Post vital signs: Reviewed and stable  Complications: No apparent anesthesia complications

## 2011-11-02 NOTE — H&P (Signed)
Natalie Anderson is an 66 y.o. female.   Chief Complaint: LBP WITH RIGHT RADIATION HPI: patient had right l45 discectomy. Did well but now she is having lbp with radiation to both legs, no better with conservative treatment.  Past Medical History  Diagnosis Date  . Asthma     as a child  . Complication of anesthesia   . Hypertension     takes Losartan daily  . Hyperlipidemia     takes Lipitor occasionally  . COPD (chronic obstructive pulmonary disease)   . Shortness of breath     with exertion  . Pneumonia     hx of-double as a child  . Seasonal allergies     takes Claritin daily  . Migraine     last one 3 wks ago  . Dizziness     occasionally  . Vertigo     hx of  . Arthritis   . Joint pain   . Joint swelling   . Chronic back pain     spondylosis and stenosis  . Dry skin     and itchy   . GERD (gastroesophageal reflux disease)     takes Protonix daily  . Urinary frequency   . Nocturia   . UTI (lower urinary tract infection)     hx of  . Blood transfusion 1979  . Vitamin d deficiency     takes Vit D 50,000units on Sat  . Osteoporosis   . Diabetes mellitus     takes Metformin bid and Levimir bid and Victoza daily  . Cataract immature     bilateral  . Impaired hearing   . History of shingles 15-52yrs ago  . Fibroid tumor     Past Surgical History  Procedure Date  . Dilation and curettage of uterus 1979  . Back surgery 12/2010    Family History  Problem Relation Age of Onset  . Anesthesia problems Neg Hx   . Hypotension Neg Hx   . Malignant hyperthermia Neg Hx   . Pseudochol deficiency Neg Hx    Social History:  reports that she has quit smoking. She does not have any smokeless tobacco history on file. She reports that she does not drink alcohol or use illicit drugs.  Allergies:  Allergies  Allergen Reactions  . Latex Rash    Blisters   . Morphine And Related Itching and Nausea And Vomiting  . Other Hives and Rash    Strawberries and oranges   .  Nickel Rash    Medications Prior to Admission  Medication Dose Route Frequency Provider Last Rate Last Dose  . acetaminophen (TYLENOL) tablet 325-650 mg  325-650 mg Oral Q4H PRN Velna Hatchet, MD      . ceFAZolin (ANCEF) IVPB 1 g/50 mL premix  1 g Intravenous 60 min Pre-Op Karn Cassis, MD      . fentaNYL (SUBLIMAZE) injection 25-50 mcg  25-50 mcg Intravenous Q5 min PRN Velna Hatchet, MD      . Fluticasone-Salmeterol (ADVAIR) 500-50 MCG/DOSE inhaler 1 puff  1 puff Inhalation BID Velna Hatchet, MD      . meperidine (DEMEROL) injection 6.25-12.5 mg  6.25-12.5 mg Intravenous Q5 min PRN Velna Hatchet, MD      . midazolam (VERSED) injection 0.5-2 mg  0.5-2 mg Intravenous Once PRN Velna Hatchet, MD      . promethazine (PHENERGAN) injection 6.25-12.5 mg  6.25-12.5 mg Intravenous Q15 min PRN Velna Hatchet, MD  Medications Prior to Admission  Medication Sig Dispense Refill  . albuterol (PROVENTIL HFA;VENTOLIN HFA) 108 (90 BASE) MCG/ACT inhaler Inhale 2 puffs into the lungs every 6 (six) hours as needed. For shortness of breath      . atorvastatin (LIPITOR) 10 MG tablet Take 10 mg by mouth every other day.      . cyclobenzaprine (FLEXERIL) 10 MG tablet Take 10 mg by mouth at bedtime as needed.      . Fluticasone-Salmeterol (ADVAIR) 500-50 MCG/DOSE AEPB Inhale 1 puff into the lungs every 12 (twelve) hours.      . gabapentin (NEURONTIN) 300 MG capsule Take 300 mg by mouth 3 (three) times daily.      . insulin aspart (NOVOLOG) 100 UNIT/ML injection Inject 10 Units into the skin daily.      . insulin detemir (LEVEMIR) 100 UNIT/ML injection Inject 25-35 Units into the skin at bedtime. Inject 35 units in the morning and 25 units in the evening      . loratadine (CLARITIN) 10 MG tablet Take 10 mg by mouth daily.      . meloxicam (MOBIC) 7.5 MG tablet Take 7.5 mg by mouth daily.      . metFORMIN (GLUCOPHAGE) 500 MG tablet Take 500 mg by mouth 2 (two) times daily with a meal.       . mometasone (NASONEX) 50 MCG/ACT nasal spray Place 2 sprays into the nose as needed.      . pantoprazole (PROTONIX) 40 MG tablet Take 40 mg by mouth daily.      . traMADol (ULTRAM) 50 MG tablet Take 50 mg by mouth at bedtime as needed.      . valsartan (DIOVAN) 320 MG tablet Take 320 mg by mouth daily.      . Vitamin D, Ergocalciferol, (DRISDOL) 50000 UNITS CAPS Take 50,000 Units by mouth every 7 (seven) days.        Results for orders placed during the hospital encounter of 11/02/11 (from the past 48 hour(s))  GLUCOSE, CAPILLARY     Status: Abnormal   Collection Time   11/02/11  8:08 AM      Component Value Range Comment   Glucose-Capillary 155 (*) 70 - 99 (mg/dL)    No results found.  Review of Systems  Constitutional: Negative.   HENT: Negative.   Eyes: Negative.   Respiratory:       ASTHMA  Cardiovascular:       ARTERIAL HYPERTENSION  Gastrointestinal: Negative.   Genitourinary: Negative.   Musculoskeletal: Positive for back pain.  Skin: Negative.   Neurological: Positive for focal weakness.  Endo/Heme/Allergies:       DM  Psychiatric/Behavioral: Negative.     Blood pressure 114/78, pulse 93, temperature 97.7 F (36.5 C), temperature source Oral, resp. rate 18, SpO2 99.00%. Physical Exam hent, nl. Neck, nl. Lungs, ronchii. Cv.nl. Abdomen,nl. Extremities, nl. NEURO slr positive at 6o degrees. Weakness df both legs. 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 Patient aware of risks including infection , csf leak , no improvement.  Kasie Leccese M 11/02/2011, 10:17 AM

## 2011-11-03 LAB — GLUCOSE, CAPILLARY
Glucose-Capillary: 397 mg/dL — ABNORMAL HIGH (ref 70–99)
Glucose-Capillary: 235 mg/dL — ABNORMAL HIGH (ref 70–99)
Glucose-Capillary: 192 mg/dL — ABNORMAL HIGH (ref 70–99)

## 2011-11-03 MED ORDER — INSULIN DETEMIR 100 UNIT/ML ~~LOC~~ SOLN: 25.0000 [IU] | Freq: Every day | SUBCUTANEOUS | Status: DC

## 2011-11-03 MED ORDER — INSULIN DETEMIR 100 UNIT/ML ~~LOC~~ SOLN
35.0000 [IU] | Freq: Every day | SUBCUTANEOUS | Status: DC
  Administered 2011-11-03: 25 [IU] via SUBCUTANEOUS
  Administered 2011-11-04: 35 [IU] via SUBCUTANEOUS

## 2011-11-03 NOTE — Progress Notes (Signed)
Patient ID: Natalie Anderson, female   DOB: November 04, 1945, 66 y.o.   MRN: [id number] Doing really well. Decrease of pain in both lower extremities. oob and walking. Plan, continue pt and to have drain removed in am

## 2011-11-03 NOTE — Progress Notes (Signed)
Clinical Social Worker received consult for "SNF." Pt is recommending home health services. RNCM is aware and following. CSW is signing off as no further discharge clinical social work needs identified. Please reconsult if a need arises prior to discharge.   Dede Query, MSW, Theresia Majors [phone]

## 2011-11-03 NOTE — Evaluation (Signed)
Physical Therapy Evaluation Patient Details Name: Natalie Anderson MRN: [id number] DOB: October 10, 1945 Today's Date: 11/03/2011  Problem List: There is no problem list on file for this patient.   Past Medical History:  Past Medical History  Diagnosis Date  . Asthma     as a child  . Complication of anesthesia   . Hypertension     takes Losartan daily  . Hyperlipidemia     takes Lipitor occasionally  . COPD (chronic obstructive pulmonary disease)   . Shortness of breath     with exertion  . Pneumonia     hx of-double as a child  . Seasonal allergies     takes Claritin daily  . Migraine     last one 3 wks ago  . Dizziness     occasionally  . Vertigo     hx of  . Arthritis   . Joint pain   . Joint swelling   . Chronic back pain     spondylosis and stenosis  . Dry skin     and itchy   . GERD (gastroesophageal reflux disease)     takes Protonix daily  . Urinary frequency   . Nocturia   . UTI (lower urinary tract infection)     hx of  . Blood transfusion 1979  . Vitamin d deficiency     takes Vit D 50,000units on Sat  . Osteoporosis   . Diabetes mellitus     takes Metformin bid and Levimir bid and Victoza daily  . Cataract immature     bilateral  . Impaired hearing   . History of shingles 15-98yrs ago  . Fibroid tumor    Past Surgical History:  Past Surgical History  Procedure Date  . Dilation and curettage of uterus 1979  . Back surgery 12/2010    PT Assessment/Plan/Recommendation PT Assessment Clinical Impression Statement: [name] is 66 y/o female s/p L4-5 diskectomy. Moving very well but limited by generalized weakness and pain following surgery. Family to assist her upon d/c. Will benefit physical therapy in the acute setting to address the below impairments as well as to maximize independence and function for safe d/c home. Rec HHPT for f/u and RW.  PT Recommendation/Assessment: Patient will need skilled PT in the acute care venue PT Problem List:  Decreased strength;Decreased activity tolerance;Decreased balance;Decreased mobility;Decreased knowledge of use of DME;Decreased knowledge of precautions;Impaired sensation;Pain PT Therapy Diagnosis : Difficulty walking;Abnormality of gait;Generalized weakness;Acute pain PT Plan PT Frequency: Min 5X/week PT Treatment/Interventions: Gait training;Functional mobility training;Therapeutic activities;Therapeutic exercise;Balance training;DME instruction;Neuromuscular re-education;Patient/family education PT Recommendation Follow Up Recommendations: Home health PT Equipment Recommended: Rolling walker with 5" wheels PT Goals  Acute Rehab PT Goals PT Goal Formulation: With patient Time For Goal Achievement: 7 days Pt will Roll Supine to Right Side: with modified independence PT Goal: Rolling Supine to Right Side - Progress: Goal set today Pt will Roll Supine to Left Side: with modified independence PT Goal: Rolling Supine to Left Side - Progress: Goal set today Pt will go Supine/Side to Sit: with modified independence PT Goal: Supine/Side to Sit - Progress: Goal set today Pt will go Sit to Supine/Side: with modified independence PT Goal: Sit to Supine/Side - Progress: Goal set today Pt will go Sit to Stand: with modified independence PT Goal: Sit to Stand - Progress: Goal set today Pt will go Stand to Sit: with modified independence PT Goal: Stand to Sit - Progress: Goal set today Pt will Transfer Bed to Chair/Chair to  Bed: with modified independence PT Transfer Goal: Bed to Chair/Chair to Bed - Progress: Goal set today Pt will Ambulate: >150 feet;with least restrictive assistive device;with modified independence PT Goal: Ambulate - Progress: Goal set today Additional Goals Additional Goal #1: Pt will verbalize and demonstrate understanding of 3/3 back precautions prior to d/c home.  PT Goal: Additional Goal #1 - Progress: Goal set today  PT Evaluation Precautions/Restrictions    Precautions Precautions: Back Prior Functioning  Home Living Lives With: Alone (daughter stays with her) Receives Help From: Family Type of Home: House Home Layout: One level Home Access: Level entry Bathroom Shower/Tub: Tub/shower unit;Curtain Bathroom Toilet: Handicapped height Home Adaptive Equipment: Straight cane (holds onto a rack? to step into tub... Daughter reports this is not very stable) Prior Function Level of Independence: Independent with basic ADLs;Independent with homemaking with ambulation;Independent with gait;Independent with transfers Driving: Yes Vocation: Retired Comments: friends will take care of her chickens for her Cognition Cognition Arousal/Alertness: Awake/alert Overall Cognitive Status: Appears within functional limits for tasks assessed Orientation Level: Oriented X4 Sensation/Coordination Sensation Light Touch: Impaired by gross assessment Additional Comments: pt reports her legs feel 'numb' as well as her hands (this appears to have been the same as prior to surgery) Coordination Gross Motor Movements are Fluid and Coordinated: Yes Fine Motor Movements are Fluid and Coordinated: Yes Extremity Assessment RLE Assessment RLE Assessment:  (generally weak; grossly 4/5; not formally tested) LLE Assessment LLE Assessment:  (generally weak, not formally test; grossly 4/5) Mobility (including Balance) Bed Mobility Bed Mobility: No (pt upright in chair) Transfers Transfers: Yes Sit to Stand: 4: Min assist;With upper extremity assist;From chair/3-in-1;With armrests Sit to Stand Details (indicate cue type and reason): cues for safe technique (back precautions); minA for follow through, pt slow to rise and extend legs secondary to weakness Stand to Sit: 4: Min assist;To chair/3-in-1;With armrests;With upper extremity assist Stand to Sit Details: cues for safe technique; pt sitting with hands still on rolling walker (which was about a foot in front of  her); minA to control descent to chair Ambulation/Gait Ambulation/Gait: Yes Ambulation/Gait Assistance:  (mingaurdA) Ambulation/Gait Assistance Details (indicate cue type and reason): amb with slow but steady gait, cues for upright posture and forward gaze as well as safe technique for back precautions during turns Ambulation Distance (Feet): 80 Feet Assistive device: Rolling walker    Exercise    End of Session PT - End of Session Equipment Utilized During Treatment: Gait belt;Back brace (pt's own personal back brace (no orders for it)) Activity Tolerance: Patient tolerated treatment well Patient left: in chair;with call bell in reach;with family/visitor present (OT still with pt) General Behavior During Session: [facility] for tasks performed Cognition: [facility] for tasks performed  [facility] HELEN 11/03/2011, 8:52 AM

## 2011-11-03 NOTE — Progress Notes (Signed)
Inpatient Diabetes Program Recommendations  AACE/ADA: New Consensus Statement on Inpatient Glycemic Control (2009)  Target Ranges:  Prepandial:   less than 140 mg/dL      Peak postprandial:   less than 180 mg/dL (1-2 hours)      Critically ill patients:  140 - 180 mg/dL   Reason for Visit: Results for HOUA, NIE (MRN [id number]) as of 11/03/2011 10:03  Ref. Range 01/04/2011 18:01 01/04/2011 21:52 01/05/2011 08:24 01/05/2011 11:46 11/02/2011 08:08 11/02/2011 21:57  Glucose-Capillary Latest Range: 70-99 mg/dL 782 (H) 956 (H) 213 (H) 261 (H) 155 (H) 232 (H)    Inpatient Diabetes Program Recommendations Insulin - Meal Coverage: May need meal coverage Novolog 4 units tid with meals to cover carbohydate intake. HgbA1C: Please check A1c to det. prehospitalization control.  Note: will follow

## 2011-11-03 NOTE — Progress Notes (Signed)
UR COMPLETED  

## 2011-11-03 NOTE — Evaluation (Signed)
Occupational Therapy Evaluation Patient Details Name: Natalie Anderson MRN: [id number] DOB: [birth year] Today's Date: 11/03/2011  Problem List: There is no problem list on file for this patient.   Past Medical History:  Past Medical History  Diagnosis Date  . Asthma     as a child  . Complication of anesthesia   . Hypertension     takes Losartan daily  . Hyperlipidemia     takes Lipitor occasionally  . COPD (chronic obstructive pulmonary disease)   . Shortness of breath     with exertion  . Pneumonia     hx of-double as a child  . Seasonal allergies     takes Claritin daily  . Migraine     last one 3 wks ago  . Dizziness     occasionally  . Vertigo     hx of  . Arthritis   . Joint pain   . Joint swelling   . Chronic back pain     spondylosis and stenosis  . Dry skin     and itchy   . GERD (gastroesophageal reflux disease)     takes Protonix daily  . Urinary frequency   . Nocturia   . UTI (lower urinary tract infection)     hx of  . Blood transfusion 1979  . Vitamin d deficiency     takes Vit D 50,000units on Sat  . Osteoporosis   . Diabetes mellitus     takes Metformin bid and Levimir bid and Victoza daily  . Cataract immature     bilateral  . Impaired hearing   . History of shingles 15-18yrs ago  . Fibroid tumor    Past Surgical History:  Past Surgical History  Procedure Date  . Dilation and curettage of uterus 1979  . Back surgery 12/2010    OT Assessment/Plan/Recommendation OT Assessment Clinical Impression Statement: 66 yo female s/p  s/p L4-5 diskectomy that could benefit from skilled OT acutely. Pt is at adequate level for d/c home. OT Recommendation/Assessment: Patient will need skilled OT in the acute care venue OT Problem List: Pain;Decreased knowledge of use of DME or AE OT Therapy Diagnosis : Acute pain OT Plan OT Frequency: Min 2X/week OT Treatment/Interventions: Self-care/ADL training;DME and/or AE instruction;Balance  training;Patient/family education;Therapeutic activities OT Recommendation Follow Up Recommendations: No OT follow up Equipment Recommended: Rolling walker with 5" wheels Individuals Consulted Consulted and Agree with Results and Recommendations: Patient;Family member/caregiver OT Goals Acute Rehab OT Goals OT Goal Formulation: With patient/family Time For Goal Achievement: 7 days ADL Goals Pt Will Perform Tub/Shower Transfer: with modified independence ADL Goal: Tub/Shower Transfer - Progress: Goal set today  OT Evaluation Precautions/Restrictions  Precautions Precautions: Back Precaution Booklet Issued: Yes (comment) Precaution Comments: back handout Restrictions Weight Bearing Restrictions: No Prior Functioning Home Living Lives With: Alone (niece can stay with her) Available Help at Discharge: Family Type of Home: House Home Access: Level entry Home Layout: One level Bathroom Shower/Tub: Tub/shower unit;Curtain Firefighter: Handicapped height Home Adaptive Equipment: Straight cane Prior Function Level of Independence: Independent Able to Take Stairs?: Yes Driving: Yes Vocation: Retired Comments: friends will take care of her chickens for her  ADL ADL Eating/Feeding: Performed;Set up Where Assessed - Eating/Feeding: Chair Grooming: Performed;Wash/dry hands;Wash/dry face;Supervision/safety Where Assessed - Grooming: Sitting, chair Upper Body Bathing: Performed;Right arm;Chest;Left arm;Abdomen;Supervision/safety Where Assessed - Upper Body Bathing: Sitting, chair;Supported Lower Body Bathing: Performed;Supervision/safety Where Assessed - Lower Body Bathing: Sit to stand from chair Upper Body Dressing: Performed;Supervision/safety Where Assessed -  Upper Body Dressing: Sitting, chair Lower Body Dressing: Performed;Supervision/safety (used a reacher) Where Assessed - Lower Body Dressing: Sit to stand from chair Toilet Transfer:  Performed;Supervision/safety Toilet Transfer Method: Proofreader: Regular height toilet;Grab bars Toileting - Clothing Manipulation: Performed;Supervision/safety Where Assessed - Toileting Clothing Manipulation: Sit to stand from 3-in-1 or toilet Toileting - Hygiene: Performed;Supervision/safety Where Assessed - Toileting Hygiene: Sit to stand from 3-in-1 or toilet Equipment Used: Rolling walker Ambulation Related to ADLs: Pt ambulated to bathroom and sink during session ADL Comments: Pt completed full ADL during session. Pt progressing well and is at adequate level for d/c home Vision/Perception  Vision - History Baseline Vision: No visual deficits Cognition Cognition Arousal/Alertness: Awake/alert Overall Cognitive Status: Appears within functional limits for tasks assessed Orientation Level: Oriented X4 Sensation/Coordination Sensation Additional Comments: pt reports her legs feel 'numb' as well as her hands (this appears to have been the same as prior to surgery) Coordination Gross Motor Movements are Fluid and Coordinated: Yes Fine Motor Movements are Fluid and Coordinated: Yes Extremity Assessment RUE Assessment RUE Assessment: Within Functional Limits (grossly) LUE Assessment LUE Assessment: Within Functional Limits (grossly) Mobility  Bed Mobility Bed Mobility: No Transfers Transfers: Yes Sit to Stand: 4: Min assist;With upper extremity assist;From chair/3-in-1;With armrests Sit to Stand Details (indicate cue type and reason): cues for safe technique (back precautions); minA for follow through, pt slow to rise and extend legs secondary to weakness Stand to Sit: 4: Min assist;To chair/3-in-1;With armrests;With upper extremity assist Exercises   End of Session OT - End of Session Equipment Utilized During Treatment: Gait belt;Back brace Activity Tolerance: Patient tolerated treatment well Patient left: in chair;with call bell in reach Nurse  Communication: Mobility status for transfers;Mobility status for ambulation General Behavior During Session: [facility] for tasks performed Cognition: [facility] for tasks performed   Lucile Shutters 11/03/2011, 2:58 PM  Pager: [phone]

## 2011-11-04 LAB — GLUCOSE, CAPILLARY: Glucose-Capillary: 117 mg/dL — ABNORMAL HIGH (ref 70–99)

## 2011-11-04 MED FILL — Sodium Chloride IV Soln 0.9%: INTRAVENOUS | Qty: 1000 | Status: AC

## 2011-11-04 MED FILL — Heparin Sodium (Porcine) Inj 1000 Unit/ML: INTRAMUSCULAR | Qty: 30 | Status: AC

## 2011-11-04 NOTE — Discharge Instructions (Signed)
Home Health to be provided by [facility] [phone] 

## 2011-11-04 NOTE — Progress Notes (Signed)
CARE MANAGEMENT NOTE 11/04/2011  Patient:  Natalie Anderson, Natalie Anderson   Account Number:  [id number]  Date Initiated:  11/04/2011  Documentation initiated by:  Vance Peper  Subjective/Objective Assessment:   66 yr old female s/p L4 Laminectomy and facetectomy     Action/Plan:   Spoke with patient and familt regarding home health needs.Choice offered   Anticipated DC Date:  11/04/2011   Anticipated DC Plan:  HOME W HOME HEALTH SERVICES      DC Planning Services  CM consult      PAC Choice  DURABLE MEDICAL EQUIPMENT  HOME HEALTH   Choice offered to / List presented to:  C-1 Patient   DME arranged  Levan Hurst      DME agency  [facility]     HH arranged  HH-2 PT      [facility] agency  [facility]   Status of service:  Completed, signed off Medicare Important Message given?   (If response is "NO", the following Medicare IM given date fields will be blank) Date Medicare IM given:   Date Additional Medicare IM given:    Discharge Disposition:  HOME W HOME HEALTH SERVICES

## 2011-11-04 NOTE — Progress Notes (Signed)
Physical Therapy Treatment Patient Details Name: Natalie Anderson MRN: [id number] DOB: [birth year] Today's Date: 11/04/2011  PT Assessment/Plan  PT - Assessment/Plan Comments on Treatment Session: Pt progress towards goals. Pt was able to amb without increase in pain. Pt had no LOB or unsteadiness during amb. Pt required VC for upright posture during amb.  PT Plan: Discharge plan remains appropriate;Frequency remains appropriate PT Frequency: Min 5X/week Follow Up Recommendations: Home health PT Equipment Recommended: Rolling walker with 5" wheels PT Goals  Acute Rehab PT Goals PT Goal: Rolling Supine to Right Side - Progress: Progressing toward goal PT Goal: Rolling Supine to Left Side - Progress: Progressing toward goal PT Goal: Supine/Side to Sit - Progress: Progressing toward goal PT Goal: Sit to Supine/Side - Progress: Progressing toward goal PT Goal: Sit to Stand - Progress: Progressing toward goal PT Goal: Stand to Sit - Progress: Progressing toward goal PT Goal: Ambulate - Progress: Progressing toward goal Additional Goals PT Goal: Additional Goal #1 - Progress: Met  PT Treatment Precautions/Restrictions  Precautions Precautions: Fall;Back Precaution Booklet Issued: Yes (comment) Precaution Comments: Pt was able to recall 3/3 back precaustions. Restrictions Weight Bearing Restrictions: No Mobility (including Balance) Bed Mobility Bed Mobility: Yes Rolling Right: 5: Supervision;With rail Rolling Left: 5: Supervision;With rail Right Sidelying to Sit: 5: Supervision;With rails;HOB flat Right Sidelying to Sit Details (indicate cue type and reason): supervision for safety  Transfers Transfers: Yes Sit to Stand: 5: Supervision;From bed;With upper extremity assist Stand to Sit: 5: Supervision;With upper extremity assist;To bed Stand to Sit Details: Pt required VC for hand placement on bed during descent  Ambulation/Gait Ambulation/Gait: Yes Ambulation/Gait Assistance: 5:  Supervision Ambulation/Gait Assistance Details (indicate cue type and reason): Pt required VC for upright posture during amb.  Ambulation Distance (Feet): 400 Feet Assistive device: Rolling walker Gait Pattern: Step-through pattern;Decreased stride length Stairs: No Wheelchair Mobility Wheelchair Mobility: No    Exercise    End of Session PT - End of Session Equipment Utilized During Treatment: Gait belt;Back brace;Other (comment) (Pt used personal back brace that was not ordered by doc) Activity Tolerance: Patient tolerated treatment well Patient left: in chair;with call bell in reach Nurse Communication: Mobility status for transfers;Mobility status for ambulation General Behavior During Session: [facility] for tasks performed Cognition: [facility] for tasks performed  Tamera Stands 11/04/2011, 12:38 PM

## 2011-11-04 NOTE — Discharge Summary (Signed)
Physician Discharge Summary  Patient ID: Natalie Anderson MRN: [id number] DOB/AGE: [birth year] 66 y.o.  Admit date: 11/02/2011 Discharge date: 11/04/2011  Admission Diagnoses:ddd l45  Discharge Diagnoses: same   Discharged Condition: stable. Decrease pain  Hospital Course: l45 fusion 11/02/11  Consults: none  Significant Diagnostic Studies: myelo  Treatments: surgery Discharge Exam: Blood pressure 114/71, pulse 113, temperature 97.1 F (36.2 C), temperature source Oral, resp. rate 18, height 5\' 2"  (1.575 m), weight 69.945 kg (154 lb 3.2 oz), SpO2 91.00%. Wound dry. No weakness  Disposition: 01-Home or Self Care   Medication List  As of 11/04/2011 11:17 AM   ASK your doctor about these medications         albuterol 108 (90 BASE) MCG/ACT inhaler   Commonly known as: PROVENTIL HFA;VENTOLIN HFA   Inhale 2 puffs into the lungs every 6 (six) hours as needed. For shortness of breath      atorvastatin 10 MG tablet   Commonly known as: LIPITOR   Take 10 mg by mouth every other day.      cyclobenzaprine 10 MG tablet   Commonly known as: FLEXERIL   Take 10 mg by mouth at bedtime as needed.      Fluticasone-Salmeterol 500-50 MCG/DOSE Aepb   Commonly known as: ADVAIR   Inhale 1 puff into the lungs every 12 (twelve) hours.      gabapentin 300 MG capsule   Commonly known as: NEURONTIN   Take 300 mg by mouth 3 (three) times daily.      insulin aspart 100 UNIT/ML injection   Commonly known as: novoLOG   Inject 10 Units into the skin daily.      insulin detemir 100 UNIT/ML injection   Commonly known as: LEVEMIR   Inject 25-35 Units into the skin at bedtime. Inject 35 units in the morning and 25 units in the evening      loratadine 10 MG tablet   Commonly known as: CLARITIN   Take 10 mg by mouth daily.      meloxicam 7.5 MG tablet   Commonly known as: MOBIC   Take 7.5 mg by mouth daily.      metFORMIN 500 MG tablet   Commonly known as: GLUCOPHAGE   Take 500 mg by mouth 2  (two) times daily with a meal.      mometasone 50 MCG/ACT nasal spray   Commonly known as: NASONEX   Place 2 sprays into the nose as needed.      pantoprazole 40 MG tablet   Commonly known as: PROTONIX   Take 40 mg by mouth daily.      traMADol 50 MG tablet   Commonly known as: ULTRAM   Take 50 mg by mouth at bedtime as needed.      valsartan 320 MG tablet   Commonly known as: DIOVAN   Take 320 mg by mouth daily.      VICTOZA 18 MG/3ML Soln   Generic drug: Liraglutide   Inject into the skin daily.      Vitamin D (Ergocalciferol) 50000 UNITS Caps   Commonly known as: DRISDOL   Take 50,000 Units by mouth every 7 (seven) days.             Signed: Karn Cassis 11/04/2011, 11:17 AM

## 2011-11-04 NOTE — Progress Notes (Signed)
11/04/2011 Precilla Purnell Elizabeth PTA [phone] pager [phone] office    

## 2011-11-04 NOTE — Progress Notes (Signed)
Patient ID: Natalie Anderson, female   DOB: 07/27/1945, 66 y.o.   MRN: [id number] PATIENT TO BE DISCHARGE TODAY. I SPOKE WITH HER. SHE WILL RESUME ALL HER MEDICATIONS AS PREOP INCLUDING INSULIN. ANY QUESTION ABOUT HER DIABETES SHE IS TO CALL HER MD.

## 2011-11-04 NOTE — Discharge Summary (Signed)
SL removed, small amount of bleeding controlled with 2x2 and pressure.  Dressing remains D&I to back.  Pt assisted to w/c donning brace.  Education given to pt and niece's that will be helping with her care.  Scripts given for valium and percocet.  Pt and family verbalize understanding of d/c instructions.  Pt d/c home with front wheel walker and personal belongings.

## 2013-08-21 IMAGING — RF DG LUMBAR SPINE 2-3V
1 series · 2 of 2 positions shown · non-contrast
Comparison: Intraoperative radiographs from 9916 hours the same day
and earlier.

Fluoroscopy time of 0.5 minutes was utilized.

CLINICAL DATA: 65-year-old female undergoing spine surgery.

LUMBAR SPINE - 2-3 VIEW

[Series 1: run · 2 of 2 slices shown]
[im 1/2]
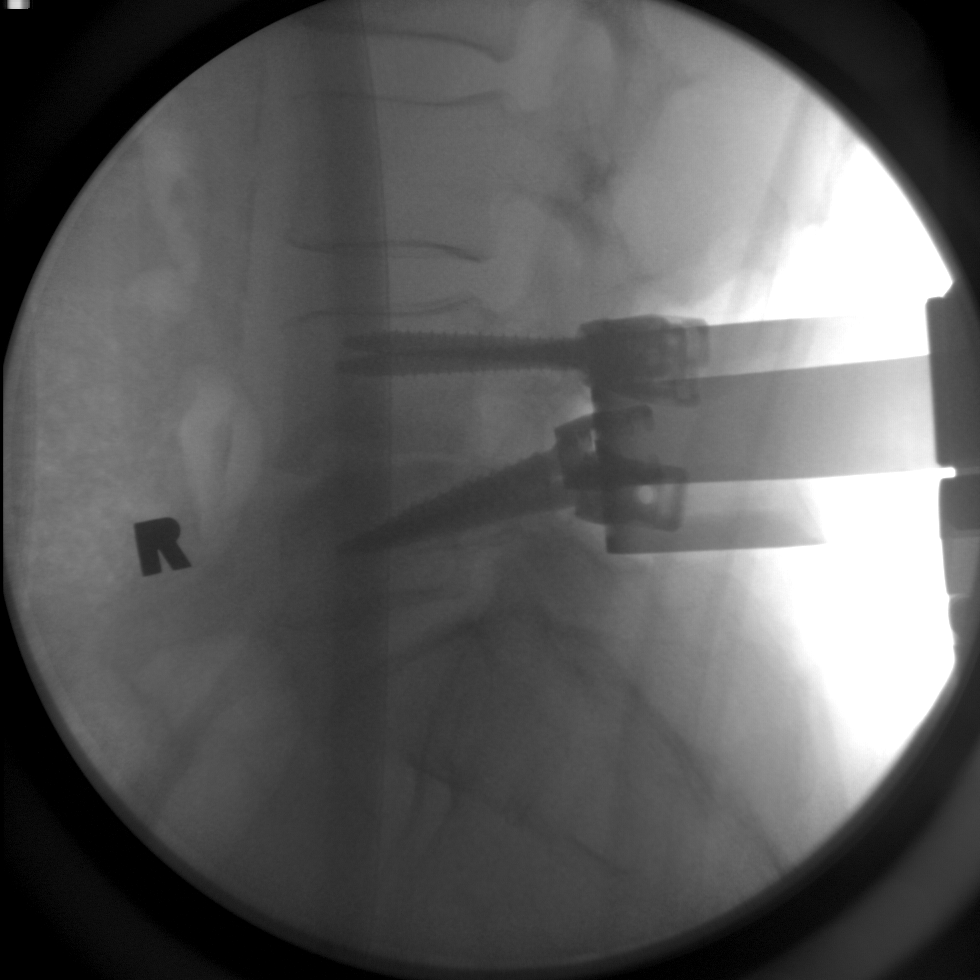
[im 2/2]
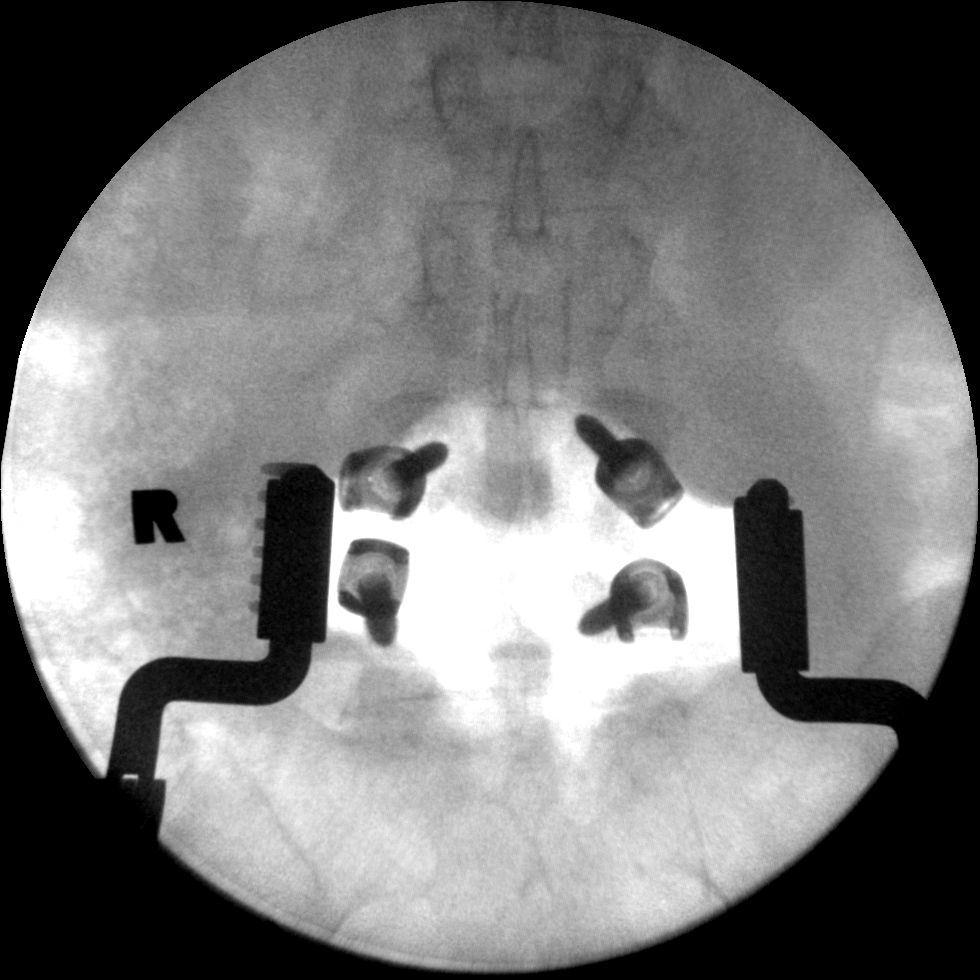

[2 of 2 positions shown; findings below may reference images not displayed]

FINDINGS: Intraoperative fluoroscopic views of the lumbar spine in
the frontal and lateral projection demonstrate transpedicular
hardware placed at L4-L5.
IMPRESSION: L4-L5 fusion under way.

## 2013-08-21 IMAGING — CR DG LUMBAR SPINE 1V
1 series · 1 of 1 positions shown · non-contrast
Comparison: CT scan 10/04/2011.

CLINICAL DATA: Intraoperative localization.

LUMBAR SPINE - 1 VIEW

[view not recorded]
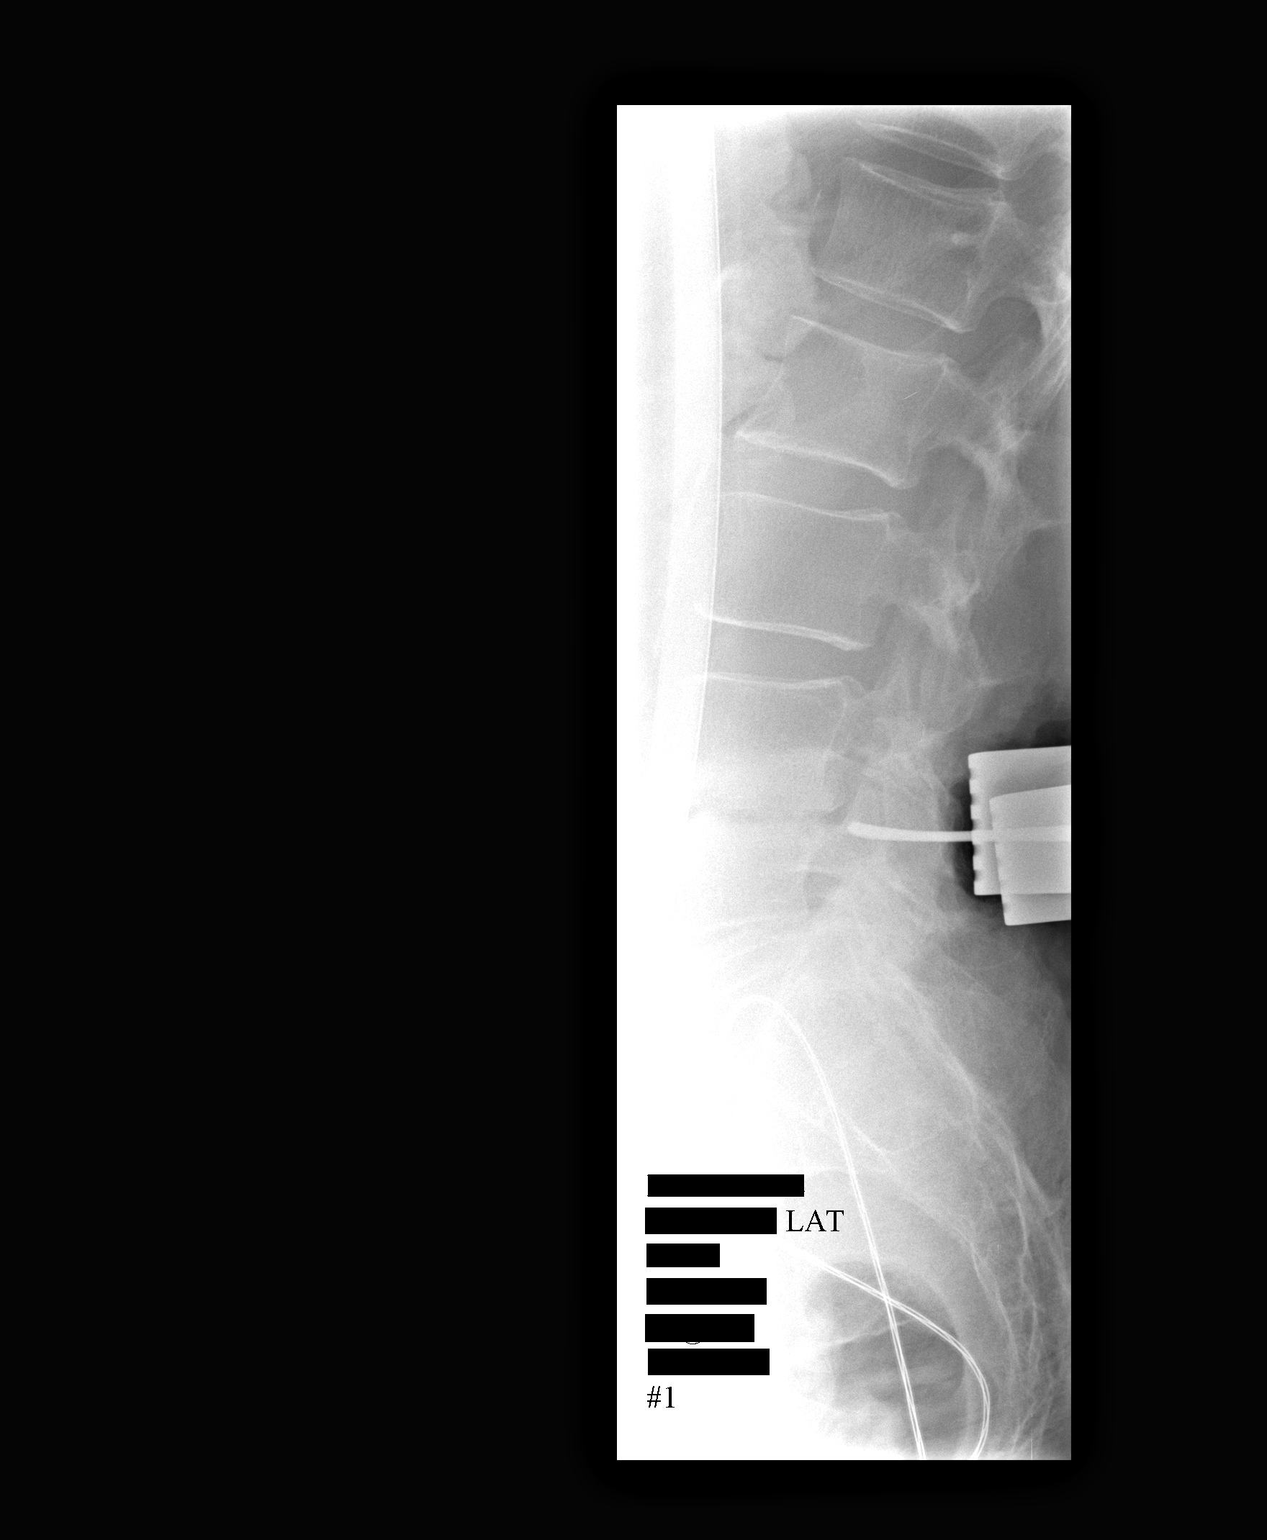

[1 of 1 positions shown; findings below may reference images not displayed]

FINDINGS: The surgical instrument is marking the L4-5 disc space
level.
IMPRESSION: L4-5 marked intraoperatively.

## 2015-08-17 DIAGNOSIS — E1142 Type 2 diabetes mellitus with diabetic polyneuropathy: Secondary | ICD-10-CM | POA: Diagnosis not present

## 2015-08-17 DIAGNOSIS — Z418 Encounter for other procedures for purposes other than remedying health state: Secondary | ICD-10-CM | POA: Diagnosis not present

## 2015-08-17 DIAGNOSIS — Z789 Other specified health status: Secondary | ICD-10-CM | POA: Diagnosis not present

## 2015-08-17 DIAGNOSIS — M79609 Pain in unspecified limb: Secondary | ICD-10-CM | POA: Diagnosis not present

## 2015-08-17 DIAGNOSIS — M545 Low back pain: Secondary | ICD-10-CM | POA: Diagnosis not present

## 2015-09-28 DIAGNOSIS — J449 Chronic obstructive pulmonary disease, unspecified: Secondary | ICD-10-CM | POA: Diagnosis not present

## 2015-09-28 DIAGNOSIS — Z6826 Body mass index (BMI) 26.0-26.9, adult: Secondary | ICD-10-CM | POA: Diagnosis not present

## 2015-09-28 DIAGNOSIS — E1142 Type 2 diabetes mellitus with diabetic polyneuropathy: Secondary | ICD-10-CM | POA: Diagnosis not present

## 2015-09-28 DIAGNOSIS — M1712 Unilateral primary osteoarthritis, left knee: Secondary | ICD-10-CM | POA: Diagnosis not present

## 2015-09-28 DIAGNOSIS — E7800 Pure hypercholesterolemia, unspecified: Secondary | ICD-10-CM | POA: Diagnosis not present

## 2015-11-02 DIAGNOSIS — E7800 Pure hypercholesterolemia, unspecified: Secondary | ICD-10-CM | POA: Diagnosis not present

## 2015-11-02 DIAGNOSIS — E1142 Type 2 diabetes mellitus with diabetic polyneuropathy: Secondary | ICD-10-CM | POA: Diagnosis not present

## 2015-11-02 DIAGNOSIS — J449 Chronic obstructive pulmonary disease, unspecified: Secondary | ICD-10-CM | POA: Diagnosis not present

## 2015-12-23 DIAGNOSIS — J449 Chronic obstructive pulmonary disease, unspecified: Secondary | ICD-10-CM | POA: Diagnosis not present

## 2015-12-23 DIAGNOSIS — E119 Type 2 diabetes mellitus without complications: Secondary | ICD-10-CM | POA: Diagnosis not present

## 2015-12-23 DIAGNOSIS — M159 Polyosteoarthritis, unspecified: Secondary | ICD-10-CM | POA: Diagnosis not present

## 2016-01-25 DIAGNOSIS — Z1211 Encounter for screening for malignant neoplasm of colon: Secondary | ICD-10-CM | POA: Diagnosis not present

## 2016-01-25 DIAGNOSIS — F419 Anxiety disorder, unspecified: Secondary | ICD-10-CM | POA: Diagnosis not present

## 2016-01-25 DIAGNOSIS — Z0000 Encounter for general adult medical examination without abnormal findings: Secondary | ICD-10-CM | POA: Diagnosis not present

## 2016-01-25 DIAGNOSIS — Z1389 Encounter for screening for other disorder: Secondary | ICD-10-CM | POA: Diagnosis not present

## 2016-01-25 DIAGNOSIS — E7800 Pure hypercholesterolemia, unspecified: Secondary | ICD-10-CM | POA: Diagnosis not present

## 2016-01-25 DIAGNOSIS — Z79899 Other long term (current) drug therapy: Secondary | ICD-10-CM | POA: Diagnosis not present

## 2016-01-25 DIAGNOSIS — Z7189 Other specified counseling: Secondary | ICD-10-CM | POA: Diagnosis not present

## 2016-01-25 DIAGNOSIS — Z6826 Body mass index (BMI) 26.0-26.9, adult: Secondary | ICD-10-CM | POA: Diagnosis not present

## 2016-01-25 DIAGNOSIS — Z299 Encounter for prophylactic measures, unspecified: Secondary | ICD-10-CM | POA: Diagnosis not present

## 2016-02-16 DIAGNOSIS — I10 Essential (primary) hypertension: Secondary | ICD-10-CM | POA: Diagnosis not present

## 2016-02-16 DIAGNOSIS — E1142 Type 2 diabetes mellitus with diabetic polyneuropathy: Secondary | ICD-10-CM | POA: Diagnosis not present

## 2016-02-16 DIAGNOSIS — E785 Hyperlipidemia, unspecified: Secondary | ICD-10-CM | POA: Diagnosis not present

## 2016-02-16 DIAGNOSIS — J449 Chronic obstructive pulmonary disease, unspecified: Secondary | ICD-10-CM | POA: Diagnosis not present

## 2016-02-16 DIAGNOSIS — Z299 Encounter for prophylactic measures, unspecified: Secondary | ICD-10-CM | POA: Diagnosis not present

## 2016-03-03 DIAGNOSIS — M159 Polyosteoarthritis, unspecified: Secondary | ICD-10-CM | POA: Diagnosis not present

## 2016-03-03 DIAGNOSIS — J449 Chronic obstructive pulmonary disease, unspecified: Secondary | ICD-10-CM | POA: Diagnosis not present

## 2016-03-03 DIAGNOSIS — E119 Type 2 diabetes mellitus without complications: Secondary | ICD-10-CM | POA: Diagnosis not present

## 2016-05-02 DIAGNOSIS — M159 Polyosteoarthritis, unspecified: Secondary | ICD-10-CM | POA: Diagnosis not present

## 2016-05-02 DIAGNOSIS — J449 Chronic obstructive pulmonary disease, unspecified: Secondary | ICD-10-CM | POA: Diagnosis not present

## 2016-05-02 DIAGNOSIS — E119 Type 2 diabetes mellitus without complications: Secondary | ICD-10-CM | POA: Diagnosis not present

## 2016-05-24 DIAGNOSIS — M1712 Unilateral primary osteoarthritis, left knee: Secondary | ICD-10-CM | POA: Diagnosis not present

## 2016-05-24 DIAGNOSIS — Z299 Encounter for prophylactic measures, unspecified: Secondary | ICD-10-CM | POA: Diagnosis not present

## 2016-05-24 DIAGNOSIS — M545 Low back pain: Secondary | ICD-10-CM | POA: Diagnosis not present

## 2016-05-24 DIAGNOSIS — Z6826 Body mass index (BMI) 26.0-26.9, adult: Secondary | ICD-10-CM | POA: Diagnosis not present

## 2016-05-24 DIAGNOSIS — J449 Chronic obstructive pulmonary disease, unspecified: Secondary | ICD-10-CM | POA: Diagnosis not present

## 2016-05-24 DIAGNOSIS — E1142 Type 2 diabetes mellitus with diabetic polyneuropathy: Secondary | ICD-10-CM | POA: Diagnosis not present

## 2016-05-25 DIAGNOSIS — Z79899 Other long term (current) drug therapy: Secondary | ICD-10-CM | POA: Diagnosis not present

## 2016-05-25 DIAGNOSIS — E7800 Pure hypercholesterolemia, unspecified: Secondary | ICD-10-CM | POA: Diagnosis not present

## 2016-06-23 DIAGNOSIS — E119 Type 2 diabetes mellitus without complications: Secondary | ICD-10-CM | POA: Diagnosis not present

## 2016-06-23 DIAGNOSIS — M159 Polyosteoarthritis, unspecified: Secondary | ICD-10-CM | POA: Diagnosis not present

## 2016-06-23 DIAGNOSIS — J449 Chronic obstructive pulmonary disease, unspecified: Secondary | ICD-10-CM | POA: Diagnosis not present

## 2016-07-05 DIAGNOSIS — J019 Acute sinusitis, unspecified: Secondary | ICD-10-CM | POA: Diagnosis not present

## 2016-07-05 DIAGNOSIS — J449 Chronic obstructive pulmonary disease, unspecified: Secondary | ICD-10-CM | POA: Diagnosis not present

## 2016-07-05 DIAGNOSIS — Z6826 Body mass index (BMI) 26.0-26.9, adult: Secondary | ICD-10-CM | POA: Diagnosis not present

## 2016-07-05 DIAGNOSIS — E1142 Type 2 diabetes mellitus with diabetic polyneuropathy: Secondary | ICD-10-CM | POA: Diagnosis not present

## 2016-07-05 DIAGNOSIS — I10 Essential (primary) hypertension: Secondary | ICD-10-CM | POA: Diagnosis not present

## 2016-07-05 DIAGNOSIS — Z299 Encounter for prophylactic measures, unspecified: Secondary | ICD-10-CM | POA: Diagnosis not present

## 2016-07-27 DIAGNOSIS — E119 Type 2 diabetes mellitus without complications: Secondary | ICD-10-CM | POA: Diagnosis not present

## 2016-07-27 DIAGNOSIS — M159 Polyosteoarthritis, unspecified: Secondary | ICD-10-CM | POA: Diagnosis not present

## 2016-07-27 DIAGNOSIS — J449 Chronic obstructive pulmonary disease, unspecified: Secondary | ICD-10-CM | POA: Diagnosis not present

## 2016-08-23 DIAGNOSIS — Z23 Encounter for immunization: Secondary | ICD-10-CM | POA: Diagnosis not present

## 2016-08-29 DIAGNOSIS — J449 Chronic obstructive pulmonary disease, unspecified: Secondary | ICD-10-CM | POA: Diagnosis not present

## 2016-08-29 DIAGNOSIS — Z299 Encounter for prophylactic measures, unspecified: Secondary | ICD-10-CM | POA: Diagnosis not present

## 2016-08-29 DIAGNOSIS — Z6826 Body mass index (BMI) 26.0-26.9, adult: Secondary | ICD-10-CM | POA: Diagnosis not present

## 2016-08-29 DIAGNOSIS — E7800 Pure hypercholesterolemia, unspecified: Secondary | ICD-10-CM | POA: Diagnosis not present

## 2016-08-29 DIAGNOSIS — Z713 Dietary counseling and surveillance: Secondary | ICD-10-CM | POA: Diagnosis not present

## 2016-08-29 DIAGNOSIS — E1142 Type 2 diabetes mellitus with diabetic polyneuropathy: Secondary | ICD-10-CM | POA: Diagnosis not present

## 2016-08-29 DIAGNOSIS — Z789 Other specified health status: Secondary | ICD-10-CM | POA: Diagnosis not present

## 2016-09-21 DIAGNOSIS — J449 Chronic obstructive pulmonary disease, unspecified: Secondary | ICD-10-CM | POA: Diagnosis not present

## 2016-09-21 DIAGNOSIS — M159 Polyosteoarthritis, unspecified: Secondary | ICD-10-CM | POA: Diagnosis not present

## 2016-09-21 DIAGNOSIS — E119 Type 2 diabetes mellitus without complications: Secondary | ICD-10-CM | POA: Diagnosis not present

## 2016-10-10 DIAGNOSIS — I10 Essential (primary) hypertension: Secondary | ICD-10-CM | POA: Diagnosis not present

## 2016-10-10 DIAGNOSIS — R946 Abnormal results of thyroid function studies: Secondary | ICD-10-CM | POA: Diagnosis not present

## 2016-10-10 DIAGNOSIS — Z6826 Body mass index (BMI) 26.0-26.9, adult: Secondary | ICD-10-CM | POA: Diagnosis not present

## 2016-10-10 DIAGNOSIS — E1142 Type 2 diabetes mellitus with diabetic polyneuropathy: Secondary | ICD-10-CM | POA: Diagnosis not present

## 2016-10-10 DIAGNOSIS — J449 Chronic obstructive pulmonary disease, unspecified: Secondary | ICD-10-CM | POA: Diagnosis not present

## 2016-10-10 DIAGNOSIS — J45909 Unspecified asthma, uncomplicated: Secondary | ICD-10-CM | POA: Diagnosis not present

## 2016-10-10 DIAGNOSIS — Z299 Encounter for prophylactic measures, unspecified: Secondary | ICD-10-CM | POA: Diagnosis not present

## 2016-10-10 DIAGNOSIS — Z713 Dietary counseling and surveillance: Secondary | ICD-10-CM | POA: Diagnosis not present

## 2016-10-19 DIAGNOSIS — M159 Polyosteoarthritis, unspecified: Secondary | ICD-10-CM | POA: Diagnosis not present

## 2016-10-19 DIAGNOSIS — E119 Type 2 diabetes mellitus without complications: Secondary | ICD-10-CM | POA: Diagnosis not present

## 2016-10-19 DIAGNOSIS — J449 Chronic obstructive pulmonary disease, unspecified: Secondary | ICD-10-CM | POA: Diagnosis not present

## 2016-12-07 DIAGNOSIS — J449 Chronic obstructive pulmonary disease, unspecified: Secondary | ICD-10-CM | POA: Diagnosis not present

## 2016-12-07 DIAGNOSIS — M159 Polyosteoarthritis, unspecified: Secondary | ICD-10-CM | POA: Diagnosis not present

## 2016-12-07 DIAGNOSIS — E119 Type 2 diabetes mellitus without complications: Secondary | ICD-10-CM | POA: Diagnosis not present

## 2017-01-13 DIAGNOSIS — E1165 Type 2 diabetes mellitus with hyperglycemia: Secondary | ICD-10-CM | POA: Diagnosis not present

## 2017-01-13 DIAGNOSIS — Z6826 Body mass index (BMI) 26.0-26.9, adult: Secondary | ICD-10-CM | POA: Diagnosis not present

## 2017-01-13 DIAGNOSIS — J45909 Unspecified asthma, uncomplicated: Secondary | ICD-10-CM | POA: Diagnosis not present

## 2017-01-13 DIAGNOSIS — H40033 Anatomical narrow angle, bilateral: Secondary | ICD-10-CM | POA: Diagnosis not present

## 2017-01-13 DIAGNOSIS — H9193 Unspecified hearing loss, bilateral: Secondary | ICD-10-CM | POA: Diagnosis not present

## 2017-01-13 DIAGNOSIS — Z299 Encounter for prophylactic measures, unspecified: Secondary | ICD-10-CM | POA: Diagnosis not present

## 2017-01-13 DIAGNOSIS — Z713 Dietary counseling and surveillance: Secondary | ICD-10-CM | POA: Diagnosis not present

## 2017-01-13 DIAGNOSIS — E1142 Type 2 diabetes mellitus with diabetic polyneuropathy: Secondary | ICD-10-CM | POA: Diagnosis not present

## 2017-01-13 DIAGNOSIS — J449 Chronic obstructive pulmonary disease, unspecified: Secondary | ICD-10-CM | POA: Diagnosis not present

## 2017-01-13 DIAGNOSIS — I10 Essential (primary) hypertension: Secondary | ICD-10-CM | POA: Diagnosis not present

## 2017-01-13 DIAGNOSIS — M67432 Ganglion, left wrist: Secondary | ICD-10-CM | POA: Diagnosis not present

## 2017-01-13 DIAGNOSIS — E785 Hyperlipidemia, unspecified: Secondary | ICD-10-CM | POA: Diagnosis not present

## 2017-01-13 DIAGNOSIS — H2513 Age-related nuclear cataract, bilateral: Secondary | ICD-10-CM | POA: Diagnosis not present

## 2017-01-26 DIAGNOSIS — Z299 Encounter for prophylactic measures, unspecified: Secondary | ICD-10-CM | POA: Diagnosis not present

## 2017-01-26 DIAGNOSIS — Z1389 Encounter for screening for other disorder: Secondary | ICD-10-CM | POA: Diagnosis not present

## 2017-01-26 DIAGNOSIS — Z1211 Encounter for screening for malignant neoplasm of colon: Secondary | ICD-10-CM | POA: Diagnosis not present

## 2017-01-26 DIAGNOSIS — E785 Hyperlipidemia, unspecified: Secondary | ICD-10-CM | POA: Diagnosis not present

## 2017-01-26 DIAGNOSIS — E1142 Type 2 diabetes mellitus with diabetic polyneuropathy: Secondary | ICD-10-CM | POA: Diagnosis not present

## 2017-01-26 DIAGNOSIS — Z79899 Other long term (current) drug therapy: Secondary | ICD-10-CM | POA: Diagnosis not present

## 2017-01-26 DIAGNOSIS — E1165 Type 2 diabetes mellitus with hyperglycemia: Secondary | ICD-10-CM | POA: Diagnosis not present

## 2017-01-26 DIAGNOSIS — Z0000 Encounter for general adult medical examination without abnormal findings: Secondary | ICD-10-CM | POA: Diagnosis not present

## 2017-01-26 DIAGNOSIS — Z7189 Other specified counseling: Secondary | ICD-10-CM | POA: Diagnosis not present

## 2017-01-26 DIAGNOSIS — J449 Chronic obstructive pulmonary disease, unspecified: Secondary | ICD-10-CM | POA: Diagnosis not present

## 2017-01-26 DIAGNOSIS — F419 Anxiety disorder, unspecified: Secondary | ICD-10-CM | POA: Diagnosis not present

## 2017-01-26 DIAGNOSIS — I10 Essential (primary) hypertension: Secondary | ICD-10-CM | POA: Diagnosis not present

## 2017-01-26 DIAGNOSIS — Z6825 Body mass index (BMI) 25.0-25.9, adult: Secondary | ICD-10-CM | POA: Diagnosis not present

## 2017-02-16 DIAGNOSIS — M159 Polyosteoarthritis, unspecified: Secondary | ICD-10-CM | POA: Diagnosis not present

## 2017-02-16 DIAGNOSIS — J449 Chronic obstructive pulmonary disease, unspecified: Secondary | ICD-10-CM | POA: Diagnosis not present

## 2017-02-16 DIAGNOSIS — E119 Type 2 diabetes mellitus without complications: Secondary | ICD-10-CM | POA: Diagnosis not present

## 2017-03-24 DIAGNOSIS — J449 Chronic obstructive pulmonary disease, unspecified: Secondary | ICD-10-CM | POA: Diagnosis not present

## 2017-03-24 DIAGNOSIS — M159 Polyosteoarthritis, unspecified: Secondary | ICD-10-CM | POA: Diagnosis not present

## 2017-03-24 DIAGNOSIS — E119 Type 2 diabetes mellitus without complications: Secondary | ICD-10-CM | POA: Diagnosis not present

## 2017-04-12 DIAGNOSIS — J449 Chronic obstructive pulmonary disease, unspecified: Secondary | ICD-10-CM | POA: Diagnosis not present

## 2017-04-12 DIAGNOSIS — M159 Polyosteoarthritis, unspecified: Secondary | ICD-10-CM | POA: Diagnosis not present

## 2017-04-12 DIAGNOSIS — E119 Type 2 diabetes mellitus without complications: Secondary | ICD-10-CM | POA: Diagnosis not present

## 2017-04-20 DIAGNOSIS — I10 Essential (primary) hypertension: Secondary | ICD-10-CM | POA: Diagnosis not present

## 2017-04-20 DIAGNOSIS — E785 Hyperlipidemia, unspecified: Secondary | ICD-10-CM | POA: Diagnosis not present

## 2017-04-20 DIAGNOSIS — Z6826 Body mass index (BMI) 26.0-26.9, adult: Secondary | ICD-10-CM | POA: Diagnosis not present

## 2017-04-20 DIAGNOSIS — J449 Chronic obstructive pulmonary disease, unspecified: Secondary | ICD-10-CM | POA: Diagnosis not present

## 2017-04-20 DIAGNOSIS — E1142 Type 2 diabetes mellitus with diabetic polyneuropathy: Secondary | ICD-10-CM | POA: Diagnosis not present

## 2017-04-20 DIAGNOSIS — Z299 Encounter for prophylactic measures, unspecified: Secondary | ICD-10-CM | POA: Diagnosis not present

## 2017-04-20 DIAGNOSIS — E1165 Type 2 diabetes mellitus with hyperglycemia: Secondary | ICD-10-CM | POA: Diagnosis not present

## 2017-04-20 DIAGNOSIS — M1712 Unilateral primary osteoarthritis, left knee: Secondary | ICD-10-CM | POA: Diagnosis not present

## 2017-04-20 DIAGNOSIS — K432 Incisional hernia without obstruction or gangrene: Secondary | ICD-10-CM | POA: Diagnosis not present

## 2017-05-04 DIAGNOSIS — M159 Polyosteoarthritis, unspecified: Secondary | ICD-10-CM | POA: Diagnosis not present

## 2017-05-04 DIAGNOSIS — E119 Type 2 diabetes mellitus without complications: Secondary | ICD-10-CM | POA: Diagnosis not present

## 2017-05-04 DIAGNOSIS — J449 Chronic obstructive pulmonary disease, unspecified: Secondary | ICD-10-CM | POA: Diagnosis not present

## 2017-05-23 DIAGNOSIS — E2839 Other primary ovarian failure: Secondary | ICD-10-CM | POA: Diagnosis not present

## 2017-05-29 DIAGNOSIS — J449 Chronic obstructive pulmonary disease, unspecified: Secondary | ICD-10-CM | POA: Diagnosis not present

## 2017-05-29 DIAGNOSIS — J069 Acute upper respiratory infection, unspecified: Secondary | ICD-10-CM | POA: Diagnosis not present

## 2017-05-29 DIAGNOSIS — E1142 Type 2 diabetes mellitus with diabetic polyneuropathy: Secondary | ICD-10-CM | POA: Diagnosis not present

## 2017-05-29 DIAGNOSIS — E1165 Type 2 diabetes mellitus with hyperglycemia: Secondary | ICD-10-CM | POA: Diagnosis not present

## 2017-05-29 DIAGNOSIS — Z6826 Body mass index (BMI) 26.0-26.9, adult: Secondary | ICD-10-CM | POA: Diagnosis not present

## 2017-05-29 DIAGNOSIS — Z299 Encounter for prophylactic measures, unspecified: Secondary | ICD-10-CM | POA: Diagnosis not present

## 2017-05-30 DIAGNOSIS — E119 Type 2 diabetes mellitus without complications: Secondary | ICD-10-CM | POA: Diagnosis not present

## 2017-05-30 DIAGNOSIS — M159 Polyosteoarthritis, unspecified: Secondary | ICD-10-CM | POA: Diagnosis not present

## 2017-05-30 DIAGNOSIS — J449 Chronic obstructive pulmonary disease, unspecified: Secondary | ICD-10-CM | POA: Diagnosis not present

## 2017-07-04 DIAGNOSIS — J449 Chronic obstructive pulmonary disease, unspecified: Secondary | ICD-10-CM | POA: Diagnosis not present

## 2017-07-04 DIAGNOSIS — E119 Type 2 diabetes mellitus without complications: Secondary | ICD-10-CM | POA: Diagnosis not present

## 2017-07-04 DIAGNOSIS — M159 Polyosteoarthritis, unspecified: Secondary | ICD-10-CM | POA: Diagnosis not present

## 2017-07-31 DIAGNOSIS — Z299 Encounter for prophylactic measures, unspecified: Secondary | ICD-10-CM | POA: Diagnosis not present

## 2017-07-31 DIAGNOSIS — E1165 Type 2 diabetes mellitus with hyperglycemia: Secondary | ICD-10-CM | POA: Diagnosis not present

## 2017-07-31 DIAGNOSIS — J449 Chronic obstructive pulmonary disease, unspecified: Secondary | ICD-10-CM | POA: Diagnosis not present

## 2017-07-31 DIAGNOSIS — Z6827 Body mass index (BMI) 27.0-27.9, adult: Secondary | ICD-10-CM | POA: Diagnosis not present

## 2017-07-31 DIAGNOSIS — Z87891 Personal history of nicotine dependence: Secondary | ICD-10-CM | POA: Diagnosis not present

## 2017-07-31 DIAGNOSIS — I10 Essential (primary) hypertension: Secondary | ICD-10-CM | POA: Diagnosis not present

## 2017-07-31 DIAGNOSIS — E1142 Type 2 diabetes mellitus with diabetic polyneuropathy: Secondary | ICD-10-CM | POA: Diagnosis not present

## 2017-09-04 DIAGNOSIS — M159 Polyosteoarthritis, unspecified: Secondary | ICD-10-CM | POA: Diagnosis not present

## 2017-09-04 DIAGNOSIS — J449 Chronic obstructive pulmonary disease, unspecified: Secondary | ICD-10-CM | POA: Diagnosis not present

## 2017-09-04 DIAGNOSIS — E119 Type 2 diabetes mellitus without complications: Secondary | ICD-10-CM | POA: Diagnosis not present

## 2017-09-29 DIAGNOSIS — J449 Chronic obstructive pulmonary disease, unspecified: Secondary | ICD-10-CM | POA: Diagnosis not present

## 2017-09-29 DIAGNOSIS — M159 Polyosteoarthritis, unspecified: Secondary | ICD-10-CM | POA: Diagnosis not present

## 2017-09-29 DIAGNOSIS — E119 Type 2 diabetes mellitus without complications: Secondary | ICD-10-CM | POA: Diagnosis not present

## 2017-11-06 DIAGNOSIS — E785 Hyperlipidemia, unspecified: Secondary | ICD-10-CM | POA: Diagnosis not present

## 2017-11-06 DIAGNOSIS — J449 Chronic obstructive pulmonary disease, unspecified: Secondary | ICD-10-CM | POA: Diagnosis not present

## 2017-11-06 DIAGNOSIS — E1142 Type 2 diabetes mellitus with diabetic polyneuropathy: Secondary | ICD-10-CM | POA: Diagnosis not present

## 2017-11-06 DIAGNOSIS — F419 Anxiety disorder, unspecified: Secondary | ICD-10-CM | POA: Diagnosis not present

## 2017-11-06 DIAGNOSIS — Z299 Encounter for prophylactic measures, unspecified: Secondary | ICD-10-CM | POA: Diagnosis not present

## 2017-11-06 DIAGNOSIS — I10 Essential (primary) hypertension: Secondary | ICD-10-CM | POA: Diagnosis not present

## 2017-11-06 DIAGNOSIS — Z6827 Body mass index (BMI) 27.0-27.9, adult: Secondary | ICD-10-CM | POA: Diagnosis not present

## 2017-11-06 DIAGNOSIS — E1165 Type 2 diabetes mellitus with hyperglycemia: Secondary | ICD-10-CM | POA: Diagnosis not present

## 2017-11-24 DIAGNOSIS — M159 Polyosteoarthritis, unspecified: Secondary | ICD-10-CM | POA: Diagnosis not present

## 2017-11-24 DIAGNOSIS — E119 Type 2 diabetes mellitus without complications: Secondary | ICD-10-CM | POA: Diagnosis not present

## 2017-11-24 DIAGNOSIS — J449 Chronic obstructive pulmonary disease, unspecified: Secondary | ICD-10-CM | POA: Diagnosis not present

## 2017-12-25 DIAGNOSIS — M159 Polyosteoarthritis, unspecified: Secondary | ICD-10-CM | POA: Diagnosis not present

## 2017-12-25 DIAGNOSIS — E119 Type 2 diabetes mellitus without complications: Secondary | ICD-10-CM | POA: Diagnosis not present

## 2017-12-25 DIAGNOSIS — J449 Chronic obstructive pulmonary disease, unspecified: Secondary | ICD-10-CM | POA: Diagnosis not present

## 2018-01-30 DIAGNOSIS — Z1211 Encounter for screening for malignant neoplasm of colon: Secondary | ICD-10-CM | POA: Diagnosis not present

## 2018-01-30 DIAGNOSIS — Z79899 Other long term (current) drug therapy: Secondary | ICD-10-CM | POA: Diagnosis not present

## 2018-01-30 DIAGNOSIS — Z6827 Body mass index (BMI) 27.0-27.9, adult: Secondary | ICD-10-CM | POA: Diagnosis not present

## 2018-01-30 DIAGNOSIS — Z1331 Encounter for screening for depression: Secondary | ICD-10-CM | POA: Diagnosis not present

## 2018-01-30 DIAGNOSIS — Z1339 Encounter for screening examination for other mental health and behavioral disorders: Secondary | ICD-10-CM | POA: Diagnosis not present

## 2018-01-30 DIAGNOSIS — E7800 Pure hypercholesterolemia, unspecified: Secondary | ICD-10-CM | POA: Diagnosis not present

## 2018-01-30 DIAGNOSIS — R5383 Other fatigue: Secondary | ICD-10-CM | POA: Diagnosis not present

## 2018-01-30 DIAGNOSIS — Z0000 Encounter for general adult medical examination without abnormal findings: Secondary | ICD-10-CM | POA: Diagnosis not present

## 2018-01-30 DIAGNOSIS — I10 Essential (primary) hypertension: Secondary | ICD-10-CM | POA: Diagnosis not present

## 2018-01-30 DIAGNOSIS — Z7189 Other specified counseling: Secondary | ICD-10-CM | POA: Diagnosis not present

## 2018-01-30 DIAGNOSIS — Z299 Encounter for prophylactic measures, unspecified: Secondary | ICD-10-CM | POA: Diagnosis not present

## 2018-02-22 DIAGNOSIS — J449 Chronic obstructive pulmonary disease, unspecified: Secondary | ICD-10-CM | POA: Diagnosis not present

## 2018-02-22 DIAGNOSIS — E1165 Type 2 diabetes mellitus with hyperglycemia: Secondary | ICD-10-CM | POA: Diagnosis not present

## 2018-02-22 DIAGNOSIS — Z6826 Body mass index (BMI) 26.0-26.9, adult: Secondary | ICD-10-CM | POA: Diagnosis not present

## 2018-02-22 DIAGNOSIS — E1142 Type 2 diabetes mellitus with diabetic polyneuropathy: Secondary | ICD-10-CM | POA: Diagnosis not present

## 2018-02-22 DIAGNOSIS — Z299 Encounter for prophylactic measures, unspecified: Secondary | ICD-10-CM | POA: Diagnosis not present

## 2018-04-05 DIAGNOSIS — M159 Polyosteoarthritis, unspecified: Secondary | ICD-10-CM | POA: Diagnosis not present

## 2018-04-05 DIAGNOSIS — J449 Chronic obstructive pulmonary disease, unspecified: Secondary | ICD-10-CM | POA: Diagnosis not present

## 2018-04-05 DIAGNOSIS — E119 Type 2 diabetes mellitus without complications: Secondary | ICD-10-CM | POA: Diagnosis not present

## 2018-05-04 DIAGNOSIS — J449 Chronic obstructive pulmonary disease, unspecified: Secondary | ICD-10-CM | POA: Diagnosis not present

## 2018-05-04 DIAGNOSIS — M159 Polyosteoarthritis, unspecified: Secondary | ICD-10-CM | POA: Diagnosis not present

## 2018-05-04 DIAGNOSIS — E119 Type 2 diabetes mellitus without complications: Secondary | ICD-10-CM | POA: Diagnosis not present

## 2018-06-07 DIAGNOSIS — I10 Essential (primary) hypertension: Secondary | ICD-10-CM | POA: Diagnosis not present

## 2018-06-07 DIAGNOSIS — Z6826 Body mass index (BMI) 26.0-26.9, adult: Secondary | ICD-10-CM | POA: Diagnosis not present

## 2018-06-07 DIAGNOSIS — Z299 Encounter for prophylactic measures, unspecified: Secondary | ICD-10-CM | POA: Diagnosis not present

## 2018-06-07 DIAGNOSIS — E1165 Type 2 diabetes mellitus with hyperglycemia: Secondary | ICD-10-CM | POA: Diagnosis not present

## 2018-06-07 DIAGNOSIS — E1142 Type 2 diabetes mellitus with diabetic polyneuropathy: Secondary | ICD-10-CM | POA: Diagnosis not present

## 2018-06-07 DIAGNOSIS — J449 Chronic obstructive pulmonary disease, unspecified: Secondary | ICD-10-CM | POA: Diagnosis not present

## 2018-06-25 DIAGNOSIS — M159 Polyosteoarthritis, unspecified: Secondary | ICD-10-CM | POA: Diagnosis not present

## 2018-06-25 DIAGNOSIS — E119 Type 2 diabetes mellitus without complications: Secondary | ICD-10-CM | POA: Diagnosis not present

## 2018-06-25 DIAGNOSIS — J449 Chronic obstructive pulmonary disease, unspecified: Secondary | ICD-10-CM | POA: Diagnosis not present

## 2018-07-05 DIAGNOSIS — Z23 Encounter for immunization: Secondary | ICD-10-CM | POA: Diagnosis not present

## 2018-07-30 DIAGNOSIS — E119 Type 2 diabetes mellitus without complications: Secondary | ICD-10-CM | POA: Diagnosis not present

## 2018-07-30 DIAGNOSIS — M159 Polyosteoarthritis, unspecified: Secondary | ICD-10-CM | POA: Diagnosis not present

## 2018-07-30 DIAGNOSIS — J449 Chronic obstructive pulmonary disease, unspecified: Secondary | ICD-10-CM | POA: Diagnosis not present

## 2018-09-05 DIAGNOSIS — I10 Essential (primary) hypertension: Secondary | ICD-10-CM | POA: Diagnosis not present

## 2018-09-05 DIAGNOSIS — Z6827 Body mass index (BMI) 27.0-27.9, adult: Secondary | ICD-10-CM | POA: Diagnosis not present

## 2018-09-05 DIAGNOSIS — Z299 Encounter for prophylactic measures, unspecified: Secondary | ICD-10-CM | POA: Diagnosis not present

## 2018-09-05 DIAGNOSIS — E1165 Type 2 diabetes mellitus with hyperglycemia: Secondary | ICD-10-CM | POA: Diagnosis not present

## 2018-09-05 DIAGNOSIS — E1142 Type 2 diabetes mellitus with diabetic polyneuropathy: Secondary | ICD-10-CM | POA: Diagnosis not present

## 2018-09-05 DIAGNOSIS — Z789 Other specified health status: Secondary | ICD-10-CM | POA: Diagnosis not present

## 2018-09-05 DIAGNOSIS — J449 Chronic obstructive pulmonary disease, unspecified: Secondary | ICD-10-CM | POA: Diagnosis not present

## 2018-09-19 DIAGNOSIS — Z299 Encounter for prophylactic measures, unspecified: Secondary | ICD-10-CM | POA: Diagnosis not present

## 2018-09-19 DIAGNOSIS — E1142 Type 2 diabetes mellitus with diabetic polyneuropathy: Secondary | ICD-10-CM | POA: Diagnosis not present

## 2018-09-19 DIAGNOSIS — Z6827 Body mass index (BMI) 27.0-27.9, adult: Secondary | ICD-10-CM | POA: Diagnosis not present

## 2018-09-19 DIAGNOSIS — I10 Essential (primary) hypertension: Secondary | ICD-10-CM | POA: Diagnosis not present

## 2018-09-19 DIAGNOSIS — J449 Chronic obstructive pulmonary disease, unspecified: Secondary | ICD-10-CM | POA: Diagnosis not present

## 2018-09-19 DIAGNOSIS — E1165 Type 2 diabetes mellitus with hyperglycemia: Secondary | ICD-10-CM | POA: Diagnosis not present

## 2018-10-29 DIAGNOSIS — J449 Chronic obstructive pulmonary disease, unspecified: Secondary | ICD-10-CM | POA: Diagnosis not present

## 2018-10-29 DIAGNOSIS — M159 Polyosteoarthritis, unspecified: Secondary | ICD-10-CM | POA: Diagnosis not present

## 2018-10-29 DIAGNOSIS — E119 Type 2 diabetes mellitus without complications: Secondary | ICD-10-CM | POA: Diagnosis not present

## 2018-12-24 DIAGNOSIS — J45909 Unspecified asthma, uncomplicated: Secondary | ICD-10-CM | POA: Diagnosis not present

## 2018-12-24 DIAGNOSIS — E1142 Type 2 diabetes mellitus with diabetic polyneuropathy: Secondary | ICD-10-CM | POA: Diagnosis not present

## 2018-12-24 DIAGNOSIS — Z299 Encounter for prophylactic measures, unspecified: Secondary | ICD-10-CM | POA: Diagnosis not present

## 2018-12-24 DIAGNOSIS — J449 Chronic obstructive pulmonary disease, unspecified: Secondary | ICD-10-CM | POA: Diagnosis not present

## 2018-12-24 DIAGNOSIS — Z6827 Body mass index (BMI) 27.0-27.9, adult: Secondary | ICD-10-CM | POA: Diagnosis not present

## 2018-12-24 DIAGNOSIS — I10 Essential (primary) hypertension: Secondary | ICD-10-CM | POA: Diagnosis not present

## 2018-12-24 DIAGNOSIS — E1165 Type 2 diabetes mellitus with hyperglycemia: Secondary | ICD-10-CM | POA: Diagnosis not present

## 2019-01-22 DIAGNOSIS — J449 Chronic obstructive pulmonary disease, unspecified: Secondary | ICD-10-CM | POA: Diagnosis not present

## 2019-01-22 DIAGNOSIS — M159 Polyosteoarthritis, unspecified: Secondary | ICD-10-CM | POA: Diagnosis not present

## 2019-01-22 DIAGNOSIS — E119 Type 2 diabetes mellitus without complications: Secondary | ICD-10-CM | POA: Diagnosis not present

## 2019-02-04 DIAGNOSIS — Z0000 Encounter for general adult medical examination without abnormal findings: Secondary | ICD-10-CM | POA: Diagnosis not present

## 2019-02-04 DIAGNOSIS — Z1331 Encounter for screening for depression: Secondary | ICD-10-CM | POA: Diagnosis not present

## 2019-02-04 DIAGNOSIS — E559 Vitamin D deficiency, unspecified: Secondary | ICD-10-CM | POA: Diagnosis not present

## 2019-02-04 DIAGNOSIS — Z299 Encounter for prophylactic measures, unspecified: Secondary | ICD-10-CM | POA: Diagnosis not present

## 2019-02-04 DIAGNOSIS — Z1339 Encounter for screening examination for other mental health and behavioral disorders: Secondary | ICD-10-CM | POA: Diagnosis not present

## 2019-02-04 DIAGNOSIS — R5383 Other fatigue: Secondary | ICD-10-CM | POA: Diagnosis not present

## 2019-02-04 DIAGNOSIS — Z79899 Other long term (current) drug therapy: Secondary | ICD-10-CM | POA: Diagnosis not present

## 2019-02-04 DIAGNOSIS — I10 Essential (primary) hypertension: Secondary | ICD-10-CM | POA: Diagnosis not present

## 2019-02-04 DIAGNOSIS — E7800 Pure hypercholesterolemia, unspecified: Secondary | ICD-10-CM | POA: Diagnosis not present

## 2019-02-04 DIAGNOSIS — Z1211 Encounter for screening for malignant neoplasm of colon: Secondary | ICD-10-CM | POA: Diagnosis not present

## 2019-02-04 DIAGNOSIS — Z7189 Other specified counseling: Secondary | ICD-10-CM | POA: Diagnosis not present

## 2019-02-04 DIAGNOSIS — Z6827 Body mass index (BMI) 27.0-27.9, adult: Secondary | ICD-10-CM | POA: Diagnosis not present

## 2019-03-22 ENCOUNTER — Other Ambulatory Visit: Payer: Self-pay

## 2019-03-22 DIAGNOSIS — Z20822 Contact with and (suspected) exposure to covid-19: Secondary | ICD-10-CM

## 2019-03-23 LAB — NOVEL CORONAVIRUS, NAA: SARS-CoV-2, NAA: NOT DETECTED

## 2019-05-07 ENCOUNTER — Other Ambulatory Visit: Payer: Self-pay

## 2019-05-07 DIAGNOSIS — Z20822 Contact with and (suspected) exposure to covid-19: Secondary | ICD-10-CM

## 2019-05-09 LAB — NOVEL CORONAVIRUS, NAA: SARS-CoV-2, NAA: NOT DETECTED

## 2019-05-16 ENCOUNTER — Telehealth: Payer: Self-pay | Admitting: Hematology

## 2019-05-16 NOTE — Telephone Encounter (Signed)
Pt is aware covid 19 test is negative °

## 2019-09-02 DIAGNOSIS — R0600 Dyspnea, unspecified: Secondary | ICD-10-CM | POA: Diagnosis not present

## 2019-09-02 DIAGNOSIS — R079 Chest pain, unspecified: Secondary | ICD-10-CM | POA: Diagnosis not present

## 2019-09-02 DIAGNOSIS — E1142 Type 2 diabetes mellitus with diabetic polyneuropathy: Secondary | ICD-10-CM | POA: Diagnosis not present

## 2019-09-02 DIAGNOSIS — J45909 Unspecified asthma, uncomplicated: Secondary | ICD-10-CM | POA: Diagnosis not present

## 2019-09-02 DIAGNOSIS — Z299 Encounter for prophylactic measures, unspecified: Secondary | ICD-10-CM | POA: Diagnosis not present

## 2019-09-02 DIAGNOSIS — I10 Essential (primary) hypertension: Secondary | ICD-10-CM | POA: Diagnosis not present

## 2019-09-16 DIAGNOSIS — E1142 Type 2 diabetes mellitus with diabetic polyneuropathy: Secondary | ICD-10-CM | POA: Diagnosis not present

## 2019-09-16 DIAGNOSIS — Z789 Other specified health status: Secondary | ICD-10-CM | POA: Diagnosis not present

## 2019-09-16 DIAGNOSIS — I10 Essential (primary) hypertension: Secondary | ICD-10-CM | POA: Diagnosis not present

## 2019-09-16 DIAGNOSIS — Z299 Encounter for prophylactic measures, unspecified: Secondary | ICD-10-CM | POA: Diagnosis not present

## 2019-09-16 DIAGNOSIS — E1165 Type 2 diabetes mellitus with hyperglycemia: Secondary | ICD-10-CM | POA: Diagnosis not present

## 2019-09-16 DIAGNOSIS — B370 Candidal stomatitis: Secondary | ICD-10-CM | POA: Diagnosis not present

## 2019-10-10 DIAGNOSIS — R809 Proteinuria, unspecified: Secondary | ICD-10-CM | POA: Diagnosis not present

## 2019-10-10 DIAGNOSIS — E1129 Type 2 diabetes mellitus with other diabetic kidney complication: Secondary | ICD-10-CM | POA: Diagnosis not present

## 2019-10-10 DIAGNOSIS — I10 Essential (primary) hypertension: Secondary | ICD-10-CM | POA: Diagnosis not present

## 2019-10-10 DIAGNOSIS — Z299 Encounter for prophylactic measures, unspecified: Secondary | ICD-10-CM | POA: Diagnosis not present

## 2019-10-10 DIAGNOSIS — E1142 Type 2 diabetes mellitus with diabetic polyneuropathy: Secondary | ICD-10-CM | POA: Diagnosis not present

## 2019-10-10 DIAGNOSIS — E1165 Type 2 diabetes mellitus with hyperglycemia: Secondary | ICD-10-CM | POA: Diagnosis not present

## 2019-11-21 DIAGNOSIS — E1165 Type 2 diabetes mellitus with hyperglycemia: Secondary | ICD-10-CM | POA: Diagnosis not present

## 2019-11-21 DIAGNOSIS — I10 Essential (primary) hypertension: Secondary | ICD-10-CM | POA: Diagnosis not present

## 2019-11-21 DIAGNOSIS — E1142 Type 2 diabetes mellitus with diabetic polyneuropathy: Secondary | ICD-10-CM | POA: Diagnosis not present

## 2019-11-21 DIAGNOSIS — Z299 Encounter for prophylactic measures, unspecified: Secondary | ICD-10-CM | POA: Diagnosis not present

## 2019-11-21 DIAGNOSIS — J449 Chronic obstructive pulmonary disease, unspecified: Secondary | ICD-10-CM | POA: Diagnosis not present

## 2019-11-22 DIAGNOSIS — E1165 Type 2 diabetes mellitus with hyperglycemia: Secondary | ICD-10-CM | POA: Diagnosis not present

## 2019-12-22 DIAGNOSIS — E1165 Type 2 diabetes mellitus with hyperglycemia: Secondary | ICD-10-CM | POA: Diagnosis not present

## 2020-01-16 DIAGNOSIS — J449 Chronic obstructive pulmonary disease, unspecified: Secondary | ICD-10-CM | POA: Diagnosis not present

## 2020-01-16 DIAGNOSIS — E1165 Type 2 diabetes mellitus with hyperglycemia: Secondary | ICD-10-CM | POA: Diagnosis not present

## 2020-01-16 DIAGNOSIS — E1142 Type 2 diabetes mellitus with diabetic polyneuropathy: Secondary | ICD-10-CM | POA: Diagnosis not present

## 2020-01-16 DIAGNOSIS — Z299 Encounter for prophylactic measures, unspecified: Secondary | ICD-10-CM | POA: Diagnosis not present

## 2020-01-16 DIAGNOSIS — M1710 Unilateral primary osteoarthritis, unspecified knee: Secondary | ICD-10-CM | POA: Diagnosis not present

## 2020-01-16 DIAGNOSIS — I10 Essential (primary) hypertension: Secondary | ICD-10-CM | POA: Diagnosis not present

## 2020-01-22 DIAGNOSIS — E1165 Type 2 diabetes mellitus with hyperglycemia: Secondary | ICD-10-CM | POA: Diagnosis not present

## 2020-02-05 DIAGNOSIS — Z299 Encounter for prophylactic measures, unspecified: Secondary | ICD-10-CM | POA: Diagnosis not present

## 2020-02-05 DIAGNOSIS — Z0000 Encounter for general adult medical examination without abnormal findings: Secondary | ICD-10-CM | POA: Diagnosis not present

## 2020-02-05 DIAGNOSIS — E559 Vitamin D deficiency, unspecified: Secondary | ICD-10-CM | POA: Diagnosis not present

## 2020-02-05 DIAGNOSIS — I10 Essential (primary) hypertension: Secondary | ICD-10-CM | POA: Diagnosis not present

## 2020-02-05 DIAGNOSIS — Z7189 Other specified counseling: Secondary | ICD-10-CM | POA: Diagnosis not present

## 2020-02-05 DIAGNOSIS — E7800 Pure hypercholesterolemia, unspecified: Secondary | ICD-10-CM | POA: Diagnosis not present

## 2020-02-05 DIAGNOSIS — Z79899 Other long term (current) drug therapy: Secondary | ICD-10-CM | POA: Diagnosis not present

## 2020-02-05 DIAGNOSIS — R5383 Other fatigue: Secondary | ICD-10-CM | POA: Diagnosis not present

## 2020-02-11 DIAGNOSIS — J301 Allergic rhinitis due to pollen: Secondary | ICD-10-CM | POA: Diagnosis not present

## 2020-02-21 DIAGNOSIS — E1165 Type 2 diabetes mellitus with hyperglycemia: Secondary | ICD-10-CM | POA: Diagnosis not present

## 2020-03-10 DIAGNOSIS — Z299 Encounter for prophylactic measures, unspecified: Secondary | ICD-10-CM | POA: Diagnosis not present

## 2020-03-10 DIAGNOSIS — I10 Essential (primary) hypertension: Secondary | ICD-10-CM | POA: Diagnosis not present

## 2020-03-10 DIAGNOSIS — E1142 Type 2 diabetes mellitus with diabetic polyneuropathy: Secondary | ICD-10-CM | POA: Diagnosis not present

## 2020-03-10 DIAGNOSIS — E1129 Type 2 diabetes mellitus with other diabetic kidney complication: Secondary | ICD-10-CM | POA: Diagnosis not present

## 2020-03-10 DIAGNOSIS — E1165 Type 2 diabetes mellitus with hyperglycemia: Secondary | ICD-10-CM | POA: Diagnosis not present

## 2020-03-24 DIAGNOSIS — E1165 Type 2 diabetes mellitus with hyperglycemia: Secondary | ICD-10-CM | POA: Diagnosis not present

## 2020-04-21 DIAGNOSIS — Z299 Encounter for prophylactic measures, unspecified: Secondary | ICD-10-CM | POA: Diagnosis not present

## 2020-04-21 DIAGNOSIS — E1142 Type 2 diabetes mellitus with diabetic polyneuropathy: Secondary | ICD-10-CM | POA: Diagnosis not present

## 2020-04-21 DIAGNOSIS — E1165 Type 2 diabetes mellitus with hyperglycemia: Secondary | ICD-10-CM | POA: Diagnosis not present

## 2020-04-21 DIAGNOSIS — E119 Type 2 diabetes mellitus without complications: Secondary | ICD-10-CM | POA: Diagnosis not present

## 2020-04-21 DIAGNOSIS — I10 Essential (primary) hypertension: Secondary | ICD-10-CM | POA: Diagnosis not present

## 2020-04-21 DIAGNOSIS — J449 Chronic obstructive pulmonary disease, unspecified: Secondary | ICD-10-CM | POA: Diagnosis not present

## 2020-04-23 DIAGNOSIS — E1142 Type 2 diabetes mellitus with diabetic polyneuropathy: Secondary | ICD-10-CM | POA: Diagnosis not present

## 2020-04-23 DIAGNOSIS — J449 Chronic obstructive pulmonary disease, unspecified: Secondary | ICD-10-CM | POA: Diagnosis not present

## 2020-04-23 DIAGNOSIS — E7849 Other hyperlipidemia: Secondary | ICD-10-CM | POA: Diagnosis not present

## 2020-04-23 DIAGNOSIS — I10 Essential (primary) hypertension: Secondary | ICD-10-CM | POA: Diagnosis not present

## 2020-04-23 DIAGNOSIS — E1165 Type 2 diabetes mellitus with hyperglycemia: Secondary | ICD-10-CM | POA: Diagnosis not present

## 2020-05-23 DIAGNOSIS — E1165 Type 2 diabetes mellitus with hyperglycemia: Secondary | ICD-10-CM | POA: Diagnosis not present

## 2020-06-23 DIAGNOSIS — I10 Essential (primary) hypertension: Secondary | ICD-10-CM | POA: Diagnosis not present

## 2020-06-23 DIAGNOSIS — E7849 Other hyperlipidemia: Secondary | ICD-10-CM | POA: Diagnosis not present

## 2020-06-23 DIAGNOSIS — J449 Chronic obstructive pulmonary disease, unspecified: Secondary | ICD-10-CM | POA: Diagnosis not present

## 2020-06-23 DIAGNOSIS — E1165 Type 2 diabetes mellitus with hyperglycemia: Secondary | ICD-10-CM | POA: Diagnosis not present

## 2020-06-23 DIAGNOSIS — E1142 Type 2 diabetes mellitus with diabetic polyneuropathy: Secondary | ICD-10-CM | POA: Diagnosis not present

## 2020-07-23 DIAGNOSIS — E1165 Type 2 diabetes mellitus with hyperglycemia: Secondary | ICD-10-CM | POA: Diagnosis not present

## 2020-08-24 DIAGNOSIS — E1165 Type 2 diabetes mellitus with hyperglycemia: Secondary | ICD-10-CM | POA: Diagnosis not present

## 2020-08-25 DIAGNOSIS — J449 Chronic obstructive pulmonary disease, unspecified: Secondary | ICD-10-CM | POA: Diagnosis not present

## 2020-08-25 DIAGNOSIS — E1165 Type 2 diabetes mellitus with hyperglycemia: Secondary | ICD-10-CM | POA: Diagnosis not present

## 2020-08-25 DIAGNOSIS — E1142 Type 2 diabetes mellitus with diabetic polyneuropathy: Secondary | ICD-10-CM | POA: Diagnosis not present

## 2020-08-25 DIAGNOSIS — Z299 Encounter for prophylactic measures, unspecified: Secondary | ICD-10-CM | POA: Diagnosis not present

## 2020-08-25 DIAGNOSIS — Z6826 Body mass index (BMI) 26.0-26.9, adult: Secondary | ICD-10-CM | POA: Diagnosis not present

## 2020-08-25 DIAGNOSIS — I10 Essential (primary) hypertension: Secondary | ICD-10-CM | POA: Diagnosis not present

## 2020-08-25 DIAGNOSIS — Z789 Other specified health status: Secondary | ICD-10-CM | POA: Diagnosis not present

## 2020-09-21 DIAGNOSIS — E1165 Type 2 diabetes mellitus with hyperglycemia: Secondary | ICD-10-CM | POA: Diagnosis not present

## 2020-10-07 DIAGNOSIS — H5203 Hypermetropia, bilateral: Secondary | ICD-10-CM | POA: Diagnosis not present

## 2020-10-07 DIAGNOSIS — H52223 Regular astigmatism, bilateral: Secondary | ICD-10-CM | POA: Diagnosis not present

## 2020-10-07 DIAGNOSIS — H2513 Age-related nuclear cataract, bilateral: Secondary | ICD-10-CM | POA: Diagnosis not present

## 2020-10-07 DIAGNOSIS — H524 Presbyopia: Secondary | ICD-10-CM | POA: Diagnosis not present

## 2020-10-07 DIAGNOSIS — E119 Type 2 diabetes mellitus without complications: Secondary | ICD-10-CM | POA: Diagnosis not present

## 2020-10-07 DIAGNOSIS — Z794 Long term (current) use of insulin: Secondary | ICD-10-CM | POA: Diagnosis not present

## 2020-10-07 DIAGNOSIS — Z7984 Long term (current) use of oral hypoglycemic drugs: Secondary | ICD-10-CM | POA: Diagnosis not present

## 2020-10-22 DIAGNOSIS — E1165 Type 2 diabetes mellitus with hyperglycemia: Secondary | ICD-10-CM | POA: Diagnosis not present

## 2020-11-21 DIAGNOSIS — E1165 Type 2 diabetes mellitus with hyperglycemia: Secondary | ICD-10-CM | POA: Diagnosis not present

## 2020-11-26 DIAGNOSIS — J449 Chronic obstructive pulmonary disease, unspecified: Secondary | ICD-10-CM | POA: Diagnosis not present

## 2020-11-26 DIAGNOSIS — Z299 Encounter for prophylactic measures, unspecified: Secondary | ICD-10-CM | POA: Diagnosis not present

## 2020-11-26 DIAGNOSIS — E785 Hyperlipidemia, unspecified: Secondary | ICD-10-CM | POA: Diagnosis not present

## 2020-11-26 DIAGNOSIS — Z6826 Body mass index (BMI) 26.0-26.9, adult: Secondary | ICD-10-CM | POA: Diagnosis not present

## 2020-11-26 DIAGNOSIS — E1165 Type 2 diabetes mellitus with hyperglycemia: Secondary | ICD-10-CM | POA: Diagnosis not present

## 2020-11-26 DIAGNOSIS — I10 Essential (primary) hypertension: Secondary | ICD-10-CM | POA: Diagnosis not present

## 2020-11-26 DIAGNOSIS — R52 Pain, unspecified: Secondary | ICD-10-CM | POA: Diagnosis not present

## 2020-12-07 DIAGNOSIS — H02831 Dermatochalasis of right upper eyelid: Secondary | ICD-10-CM | POA: Diagnosis not present

## 2020-12-07 DIAGNOSIS — H01005 Unspecified blepharitis left lower eyelid: Secondary | ICD-10-CM | POA: Diagnosis not present

## 2020-12-07 DIAGNOSIS — H01001 Unspecified blepharitis right upper eyelid: Secondary | ICD-10-CM | POA: Diagnosis not present

## 2020-12-07 DIAGNOSIS — H2513 Age-related nuclear cataract, bilateral: Secondary | ICD-10-CM | POA: Diagnosis not present

## 2020-12-07 DIAGNOSIS — H01002 Unspecified blepharitis right lower eyelid: Secondary | ICD-10-CM | POA: Diagnosis not present

## 2020-12-07 DIAGNOSIS — H01004 Unspecified blepharitis left upper eyelid: Secondary | ICD-10-CM | POA: Diagnosis not present

## 2020-12-07 DIAGNOSIS — H02834 Dermatochalasis of left upper eyelid: Secondary | ICD-10-CM | POA: Diagnosis not present

## 2020-12-07 DIAGNOSIS — H11153 Pinguecula, bilateral: Secondary | ICD-10-CM | POA: Diagnosis not present

## 2020-12-22 DIAGNOSIS — E1165 Type 2 diabetes mellitus with hyperglycemia: Secondary | ICD-10-CM | POA: Diagnosis not present

## 2021-01-21 DIAGNOSIS — E1165 Type 2 diabetes mellitus with hyperglycemia: Secondary | ICD-10-CM | POA: Diagnosis not present

## 2021-02-08 ENCOUNTER — Other Ambulatory Visit: Payer: Self-pay

## 2021-02-08 ENCOUNTER — Encounter (HOSPITAL_COMMUNITY)
Admission: RE | Admit: 2021-02-08 | Discharge: 2021-02-08 | Disposition: A | Discharge status: Home / Self Care | Payer: HMO | Source: Ambulatory Visit | Attending: Ophthalmology | Admitting: Ophthalmology

## 2021-02-12 ENCOUNTER — Other Ambulatory Visit (HOSPITAL_COMMUNITY): Payer: Medicare Other

## 2021-02-15 ENCOUNTER — Ambulatory Visit (HOSPITAL_COMMUNITY): Admission: RE | Admit: 2021-02-15 | Payer: HMO | Source: Home / Self Care | Admitting: Ophthalmology

## 2021-02-15 ENCOUNTER — Encounter (HOSPITAL_COMMUNITY): Admission: RE | Payer: Self-pay | Source: Home / Self Care

## 2021-02-15 SURGERY — PHACOEMULSIFICATION, CATARACT, WITH IOL INSERTION
Anesthesia: Monitor Anesthesia Care | Laterality: Right

## 2021-02-19 DIAGNOSIS — E1165 Type 2 diabetes mellitus with hyperglycemia: Secondary | ICD-10-CM | POA: Diagnosis not present

## 2021-03-10 DIAGNOSIS — Z0000 Encounter for general adult medical examination without abnormal findings: Secondary | ICD-10-CM | POA: Diagnosis not present

## 2021-03-10 DIAGNOSIS — E2839 Other primary ovarian failure: Secondary | ICD-10-CM | POA: Diagnosis not present

## 2021-03-10 DIAGNOSIS — E1165 Type 2 diabetes mellitus with hyperglycemia: Secondary | ICD-10-CM | POA: Diagnosis not present

## 2021-03-10 DIAGNOSIS — Z7189 Other specified counseling: Secondary | ICD-10-CM | POA: Diagnosis not present

## 2021-03-10 DIAGNOSIS — Z1331 Encounter for screening for depression: Secondary | ICD-10-CM | POA: Diagnosis not present

## 2021-03-10 DIAGNOSIS — R5383 Other fatigue: Secondary | ICD-10-CM | POA: Diagnosis not present

## 2021-03-10 DIAGNOSIS — I10 Essential (primary) hypertension: Secondary | ICD-10-CM | POA: Diagnosis not present

## 2021-03-10 DIAGNOSIS — Z79899 Other long term (current) drug therapy: Secondary | ICD-10-CM | POA: Diagnosis not present

## 2021-03-10 DIAGNOSIS — Z1339 Encounter for screening examination for other mental health and behavioral disorders: Secondary | ICD-10-CM | POA: Diagnosis not present

## 2021-03-10 DIAGNOSIS — E7800 Pure hypercholesterolemia, unspecified: Secondary | ICD-10-CM | POA: Diagnosis not present

## 2021-03-10 DIAGNOSIS — Z6825 Body mass index (BMI) 25.0-25.9, adult: Secondary | ICD-10-CM | POA: Diagnosis not present

## 2021-03-10 DIAGNOSIS — Z299 Encounter for prophylactic measures, unspecified: Secondary | ICD-10-CM | POA: Diagnosis not present

## 2021-03-10 DIAGNOSIS — Z1211 Encounter for screening for malignant neoplasm of colon: Secondary | ICD-10-CM | POA: Diagnosis not present

## 2021-03-18 DIAGNOSIS — H01002 Unspecified blepharitis right lower eyelid: Secondary | ICD-10-CM | POA: Diagnosis not present

## 2021-03-18 DIAGNOSIS — H01001 Unspecified blepharitis right upper eyelid: Secondary | ICD-10-CM | POA: Diagnosis not present

## 2021-03-18 DIAGNOSIS — H01004 Unspecified blepharitis left upper eyelid: Secondary | ICD-10-CM | POA: Diagnosis not present

## 2021-03-18 DIAGNOSIS — H2513 Age-related nuclear cataract, bilateral: Secondary | ICD-10-CM | POA: Diagnosis not present

## 2021-03-24 DIAGNOSIS — E1165 Type 2 diabetes mellitus with hyperglycemia: Secondary | ICD-10-CM | POA: Diagnosis not present

## 2021-03-30 ENCOUNTER — Other Ambulatory Visit: Payer: Self-pay

## 2021-03-30 ENCOUNTER — Encounter (HOSPITAL_COMMUNITY)
Admission: RE | Admit: 2021-03-30 | Discharge: 2021-03-30 | Disposition: A | Discharge status: Home / Self Care | Payer: HMO | Source: Ambulatory Visit | Attending: Ophthalmology | Admitting: Ophthalmology

## 2021-03-30 HISTORY — DX: Age-related osteoporosis without current pathological fracture: M81.0

## 2021-03-31 NOTE — H&P (Signed)
Surgical History & Physical  Patient Name: Natalie Anderson DOB: 06-25-1946  Surgery: Cataract extraction with intraocular lens implant phacoemulsification; Right Eye  Surgeon: [name] MD Surgery Date:  04/05/2021 Pre-Op Date:  03/18/2021  HPI: [name] is here for cataract re-eval. 1. 1. The patient complains of difficulty when driving due to glare from headlights or sun, which began 3 years ago. Both eyes are affected. The episode is gradual. The condition's severity increased since last visit. Symptoms occur when the patient is driving, inside and outside. This is negatively affecting the patient's quality of life. HPI Completed by Dr. [name]  Medical History: Cataracts Macula Degeneration Arthritis Asthma Bone Disease Diabetes High Blood Pressure LDL  Review of Systems Negative Allergic/Immunologic Negative Cardiovascular Negative Constitutional Negative Ear, Nose, Mouth & Throat Negative Endocrine Negative Eyes Negative Gastrointestinal Negative Genitourinary Negative Hemotologic/Lymphatic Negative Integumentary Negative Musculoskeletal Negative Neurological Negative Psychiatry Negative Respiratory  Social   Never smoked   Medication Latanoprost,  Tylenol, Rosuvastatin, Symbicort Inhalant Product, Fluticasone, Gabapentin, Insulin, Albuterol, Alendronate, Amlodipine besylate, Aspirin, Januvia, Vitamin D, Losartan Potassium, Metformin, Pravastatin,   Sx/Procedures Back Surgery,   Drug Allergies  Latex, Peniciilin, Coal Tar, Strawberry,   History & Physical: Heent:  Cataract, Right eye NECK: supple without bruits LUNGS: lungs clear to auscultation CV: regular rate and rhythm Abdomen: soft and non-tender  Impression & Plan: Assessment: 1.  NUCLEAR SCLEROSIS AGE RELATED; Both Eyes (H25.13) 2.  BLEPHARITIS; Right Upper Lid, Right Lower Lid, Left Upper Lid, Left Lower Lid (H01.001, H01.002,H01.004,H01.005) 3.  Pinguecula; Both  Eyes (H11.153) 4.  DERMATOCHALASIS; Right Upper Lid, Left Upper Lid (H02.831, [id number])  Plan: 1.  Cataract accounts for the patient's decreased vision. This visual impairment is not correctable with a tolerable change in glasses or contact lenses. Cataract surgery with an implantation of a new lens should significantly improve the visual and functional status of the patient. Discussed all risks, benefits, alternatives, and potential complications. Discussed the procedures and recovery. Patient desires to have surgery. A-scan ordered and performed today for intra-ocular lens calculations. The surgery will be performed in order to improve vision for driving, reading, and for eye examinations. Recommend phacoemulsification with intra-ocular lens. Recommend Dextenza for post-operative pain and inflammation. Right Eye worse - first. Dilates poorly - shugacaine by protocol. Malyugin Ring in room. Omidira.  2.  Recommend regular lid cleaning.  3.  Observe; Artificial tears as needed for irritation.  4.  Asymptomatic, recommend observation for now. Findings, prognosis and treatment options reviewed.

## 2021-03-31 NOTE — Pre-Procedure Instructions (Signed)
Attempted 9/6 and 03/31/2021 to call patient for pre-op phone call. Left messages for her to call us back X2.

## 2021-04-01 ENCOUNTER — Other Ambulatory Visit: Payer: Self-pay

## 2021-04-01 ENCOUNTER — Encounter (HOSPITAL_COMMUNITY): Payer: Self-pay

## 2021-04-01 DIAGNOSIS — H25811 Combined forms of age-related cataract, right eye: Secondary | ICD-10-CM | POA: Diagnosis not present

## 2021-04-02 DIAGNOSIS — Z1211 Encounter for screening for malignant neoplasm of colon: Secondary | ICD-10-CM | POA: Diagnosis not present

## 2021-04-02 DIAGNOSIS — Z1212 Encounter for screening for malignant neoplasm of rectum: Secondary | ICD-10-CM | POA: Diagnosis not present

## 2021-04-05 ENCOUNTER — Encounter (HOSPITAL_COMMUNITY): Payer: Self-pay | Admitting: Ophthalmology

## 2021-04-05 ENCOUNTER — Ambulatory Visit (HOSPITAL_COMMUNITY): Payer: HMO | Admitting: Certified Registered"

## 2021-04-05 ENCOUNTER — Ambulatory Visit (HOSPITAL_COMMUNITY)
Admission: RE | Admit: 2021-04-05 | Discharge: 2021-04-05 | Disposition: A | Discharge status: Home / Self Care | Payer: HMO | Attending: Ophthalmology | Admitting: Ophthalmology

## 2021-04-05 ENCOUNTER — Encounter (HOSPITAL_COMMUNITY)
Admission: RE | Disposition: A | Discharge status: Home / Self Care | Payer: Self-pay | Source: Home / Self Care | Attending: Ophthalmology

## 2021-04-05 DIAGNOSIS — H11153 Pinguecula, bilateral: Secondary | ICD-10-CM | POA: Diagnosis not present

## 2021-04-05 DIAGNOSIS — Z7984 Long term (current) use of oral hypoglycemic drugs: Secondary | ICD-10-CM | POA: Diagnosis not present

## 2021-04-05 DIAGNOSIS — Z888 Allergy status to other drugs, medicaments and biological substances status: Secondary | ICD-10-CM | POA: Diagnosis not present

## 2021-04-05 DIAGNOSIS — Z7982 Long term (current) use of aspirin: Secondary | ICD-10-CM | POA: Diagnosis not present

## 2021-04-05 DIAGNOSIS — Z7951 Long term (current) use of inhaled steroids: Secondary | ICD-10-CM | POA: Insufficient documentation

## 2021-04-05 DIAGNOSIS — Z79899 Other long term (current) drug therapy: Secondary | ICD-10-CM | POA: Diagnosis not present

## 2021-04-05 DIAGNOSIS — H02834 Dermatochalasis of left upper eyelid: Secondary | ICD-10-CM | POA: Insufficient documentation

## 2021-04-05 DIAGNOSIS — H02831 Dermatochalasis of right upper eyelid: Secondary | ICD-10-CM | POA: Diagnosis not present

## 2021-04-05 DIAGNOSIS — H25811 Combined forms of age-related cataract, right eye: Secondary | ICD-10-CM | POA: Diagnosis not present

## 2021-04-05 DIAGNOSIS — Z91040 Latex allergy status: Secondary | ICD-10-CM | POA: Insufficient documentation

## 2021-04-05 DIAGNOSIS — H0100B Unspecified blepharitis left eye, upper and lower eyelids: Secondary | ICD-10-CM | POA: Insufficient documentation

## 2021-04-05 DIAGNOSIS — H2513 Age-related nuclear cataract, bilateral: Secondary | ICD-10-CM | POA: Diagnosis not present

## 2021-04-05 DIAGNOSIS — J449 Chronic obstructive pulmonary disease, unspecified: Secondary | ICD-10-CM | POA: Diagnosis not present

## 2021-04-05 DIAGNOSIS — E1136 Type 2 diabetes mellitus with diabetic cataract: Secondary | ICD-10-CM | POA: Diagnosis not present

## 2021-04-05 DIAGNOSIS — Z880 Allergy status to penicillin: Secondary | ICD-10-CM | POA: Insufficient documentation

## 2021-04-05 DIAGNOSIS — H0100A Unspecified blepharitis right eye, upper and lower eyelids: Secondary | ICD-10-CM | POA: Insufficient documentation

## 2021-04-05 DIAGNOSIS — Z794 Long term (current) use of insulin: Secondary | ICD-10-CM | POA: Diagnosis not present

## 2021-04-05 DIAGNOSIS — H2511 Age-related nuclear cataract, right eye: Secondary | ICD-10-CM | POA: Diagnosis not present

## 2021-04-05 DIAGNOSIS — Z91018 Allergy to other foods: Secondary | ICD-10-CM | POA: Diagnosis not present

## 2021-04-05 HISTORY — PX: CATARACT EXTRACTION W/PHACO: SHX586

## 2021-04-05 LAB — GLUCOSE, CAPILLARY: Glucose-Capillary: 106 mg/dL — ABNORMAL HIGH (ref 70–99)

## 2021-04-05 SURGERY — PHACOEMULSIFICATION, CATARACT, WITH IOL INSERTION
Anesthesia: Monitor Anesthesia Care | Site: Eye | Laterality: Right

## 2021-04-05 MED ORDER — PHENYLEPHRINE HCL 2.5 % OP SOLN
1.0000 [drp] | OPHTHALMIC | Status: AC | PRN
  Administered 2021-04-05 (×3): 1 [drp] via OPHTHALMIC

## 2021-04-05 MED ORDER — SODIUM HYALURONATE 10 MG/ML IO SOLUTION
PREFILLED_SYRINGE | INTRAOCULAR | Status: DC | PRN
  Administered 2021-04-05: 0.85 mL via INTRAOCULAR

## 2021-04-05 MED ORDER — TETRACAINE HCL 0.5 % OP SOLN
1.0000 [drp] | OPHTHALMIC | Status: AC | PRN
  Administered 2021-04-05 (×3): 1 [drp] via OPHTHALMIC

## 2021-04-05 MED ORDER — BSS IO SOLN
INTRAOCULAR | Status: DC | PRN
  Administered 2021-04-05: 15 mL via INTRAOCULAR

## 2021-04-05 MED ORDER — POVIDONE-IODINE 5 % OP SOLN
OPHTHALMIC | Status: DC | PRN
  Administered 2021-04-05: 1 via OPHTHALMIC

## 2021-04-05 MED ORDER — PHENYLEPHRINE-KETOROLAC 1-0.3 % IO SOLN
INTRAOCULAR | Status: DC | PRN
  Administered 2021-04-05: 500 mL via OPHTHALMIC

## 2021-04-05 MED ORDER — LIDOCAINE HCL 3.5 % OP GEL
1.0000 "application " | Freq: Once | OPHTHALMIC | Status: AC
  Administered 2021-04-05: 1 via OPHTHALMIC

## 2021-04-05 MED ORDER — EPINEPHRINE PF 1 MG/ML IJ SOLN
INTRAMUSCULAR | Status: AC
  Filled 2021-04-05: qty 1

## 2021-04-05 MED ORDER — TROPICAMIDE 1 % OP SOLN
1.0000 [drp] | OPHTHALMIC | Status: AC
  Administered 2021-04-05 (×3): 1 [drp] via OPHTHALMIC

## 2021-04-05 MED ORDER — PHENYLEPHRINE-KETOROLAC 1-0.3 % IO SOLN
INTRAOCULAR | Status: AC
  Filled 2021-04-05: qty 4

## 2021-04-05 MED ORDER — LIDOCAINE HCL (PF) 1 % IJ SOLN
INTRAOCULAR | Status: DC | PRN
  Administered 2021-04-05: 1 mL via OPHTHALMIC

## 2021-04-05 MED ORDER — SODIUM HYALURONATE 23MG/ML IO SOSY
PREFILLED_SYRINGE | INTRAOCULAR | Status: DC | PRN
  Administered 2021-04-05: 0.6 mL via INTRAOCULAR

## 2021-04-05 MED ORDER — STERILE WATER FOR IRRIGATION IR SOLN
Status: DC | PRN
  Administered 2021-04-05: 250 mL

## 2021-04-05 SURGICAL SUPPLY — 11 items
CLOTH BEACON ORANGE TIMEOUT ST (SAFETY) ×2 IMPLANT
EYE SHIELD UNIVERSAL CLEAR (GAUZE/BANDAGES/DRESSINGS) ×2 IMPLANT
GLOVE SURG UNDER POLY LF SZ6.5 (GLOVE) ×2 IMPLANT
GLOVE SURG UNDER POLY LF SZ7 (GLOVE) ×2 IMPLANT
NEEDLE HYPO 18GX1.5 BLUNT FILL (NEEDLE) ×2 IMPLANT
PAD ARMBOARD 7.5X6 YLW CONV (MISCELLANEOUS) ×2 IMPLANT
SYR TB 1ML LL NO SAFETY (SYRINGE) ×2 IMPLANT
TAPE SURG TRANSPORE 1 IN (GAUZE/BANDAGES/DRESSINGS) ×1 IMPLANT
TAPE SURGICAL TRANSPORE 1 IN (GAUZE/BANDAGES/DRESSINGS) ×2
Tecnis 1-Piece IOL (Intraocular Lens) ×2 IMPLANT
WATER STERILE IRR 250ML POUR (IV SOLUTION) ×2 IMPLANT

## 2021-04-05 NOTE — Anesthesia Postprocedure Evaluation (Signed)
Anesthesia Post Note  Patient: Natalie Anderson  Procedure(s) Performed: CATARACT EXTRACTION PHACO AND INTRAOCULAR LENS PLACEMENT (IOC) (Right: Eye)  Patient location during evaluation: Phase II Anesthesia Type: MAC Level of consciousness: awake Pain management: pain level controlled Vital Signs Assessment: post-procedure vital signs reviewed and stable Respiratory status: spontaneous breathing and respiratory function stable Cardiovascular status: blood pressure returned to baseline and stable Postop Assessment: no headache and no apparent nausea or vomiting Anesthetic complications: no Comments: Late entry   No notable events documented.   Last Vitals:  Vitals:   04/05/21 0844 04/05/21 0944  BP: (!) 147/65 (!) 155/69  Pulse: 76 79  Resp: 18 18  Temp: 36.8 C 36.6 C  SpO2: 99% 100%    Last Pain:  Vitals:   04/05/21 0944  TempSrc: Oral  PainSc: 0-No pain                 Louann Sjogren

## 2021-04-05 NOTE — Anesthesia Preprocedure Evaluation (Signed)
Anesthesia Evaluation  Patient identified by MRN, date of birth, ID band Patient awake    Reviewed: Allergy & Precautions, H&P , NPO status , Patient's Chart, lab work & pertinent test results, reviewed documented beta blocker date and time   Airway Mallampati: II  TM Distance: >3 FB Neck ROM: full    Dental no notable dental hx.    Pulmonary shortness of breath, asthma , pneumonia, COPD,    Pulmonary exam normal breath sounds clear to auscultation       Cardiovascular Exercise Tolerance: Good hypertension, negative cardio ROS   Rhythm:regular Rate:Normal     Neuro/Psych  Headaches, negative psych ROS   GI/Hepatic Neg liver ROS, GERD  Medicated,  Endo/Other  negative endocrine ROSdiabetes  Renal/GU negative Renal ROS  negative genitourinary   Musculoskeletal   Abdominal   Peds  Hematology negative hematology ROS (+)   Anesthesia Other Findings   Reproductive/Obstetrics negative OB ROS                             Anesthesia Physical Anesthesia Plan  ASA: 3  Anesthesia Plan: MAC   Post-op Pain Management:    Induction:   PONV Risk Score and Plan:   Airway Management Planned:   Additional Equipment:   Intra-op Plan:   Post-operative Plan:   Informed Consent: I have reviewed the patients History and Physical, chart, labs and discussed the procedure including the risks, benefits and alternatives for the proposed anesthesia with the patient or authorized representative who has indicated his/her understanding and acceptance.     Dental Advisory Given  Plan Discussed with: CRNA  Anesthesia Plan Comments:         Anesthesia Quick Evaluation

## 2021-04-05 NOTE — Interval H&P Note (Signed)
History and Physical Interval Note:  04/05/2021 9:19 AM  Natalie Anderson  has presented today for surgery, with the diagnosis of Nuclear sclerotic cataract - Right eye.  The various methods of treatment have been discussed with the patient and family. After consideration of risks, benefits and other options for treatment, the patient has consented to  Procedure(s) with comments: CATARACT EXTRACTION PHACO AND INTRAOCULAR LENS PLACEMENT (IOC) (Right) - right as a surgical intervention.  The patient's history has been reviewed, patient examined, no change in status, stable for surgery.  I have reviewed the patient's chart and labs.  Questions were answered to the patient's satisfaction.     [name]

## 2021-04-05 NOTE — Transfer of Care (Signed)
Immediate Anesthesia Transfer of Care Note  Patient: Natalie Anderson  Procedure(s) Performed: CATARACT EXTRACTION PHACO AND INTRAOCULAR LENS PLACEMENT (IOC) (Right: Eye)  Patient Location: Short Stay  Anesthesia Type:MAC  Level of Consciousness: awake, alert  and oriented  Airway & Oxygen Therapy: Patient Spontanous Breathing  Post-op Assessment: Report given to RN and Post -op Vital signs reviewed and stable  Post vital signs: Reviewed and stable  Last Vitals:  Vitals Value Taken Time  BP    Temp    Pulse    Resp    SpO2      Last Pain:  Vitals:   04/05/21 0844  TempSrc: Oral  PainSc: 7       Patients Stated Pain Goal: 8 (67/59/16 [id number])  Complications: No notable events documented.

## 2021-04-05 NOTE — Op Note (Signed)
Date of procedure: 04/05/21  Pre-operative diagnosis: Visually significant age-related nuclear cataract, Right Eye (H25.11)  Post-operative diagnosis: Visually significant age-related nuclear cataract, Right Eye  Procedure: Removal of cataract via phacoemulsification and insertion of intra-ocular lens Wynetta Emery and Johnson DCB00 +21.5D into the capsular bag of the Right Eye  Attending surgeon: Gerda Diss. Linnie Mcglocklin, MD, MA  Anesthesia: MAC, Topical Akten  Complications: None  Estimated Blood Loss: <69m (minimal)  Specimens: None  Implants: As above  Indications:  Visually significant age-related cataract, Right Eye  Procedure:  The patient was seen and identified in the pre-operative area. The operative eye was identified and dilated.  The operative eye was marked.  Topical anesthesia was administered to the operative eye.     The patient was then to the operative suite and placed in the supine position.  A timeout was performed confirming the patient, procedure to be performed, and all other relevant information.   The patient's face was prepped and draped in the usual fashion for intra-ocular surgery.  A lid speculum was placed into the operative eye and the surgical microscope moved into place and focused.  A superotemporal paracentesis was created using a 20 gauge paracentesis blade.  Shugarcaine was injected into the anterior chamber.  Viscoelastic was injected into the anterior chamber.  A temporal clear-corneal main wound incision was created using a 2.469mmicrokeratome.  A continuous curvilinear capsulorrhexis was initiated using an irrigating cystitome and completed using capsulorrhexis forceps.  Hydrodissection and hydrodeliniation were performed.  Viscoelastic was injected into the anterior chamber.  A phacoemulsification handpiece and a chopper as a second instrument were used to remove the nucleus and epinucleus. The irrigation/aspiration handpiece was used to remove any remaining  cortical material.   The capsular bag was reinflated with viscoelastic, checked, and found to be intact.  The intraocular lens was inserted into the capsular bag.  The irrigation/aspiration handpiece was used to remove any remaining viscoelastic.  The clear corneal wound and paracentesis wounds were then hydrated and checked with Weck-Cels to be watertight.  The lid-speculum and drape was removed, and the patient's face was cleaned with a wet and dry 4x4.  Maxitrol was instilled in the eye before a clear shield was taped over the eye. The patient was taken to the post-operative care unit in good condition, having tolerated the procedure well.  Post-Op Instructions: [facility] and will receive all other orders and instructions.

## 2021-04-05 NOTE — Discharge Instructions (Addendum)
Please discharge patient when stable, will follow up today with Dr. Wrzosek at the [facility] office immediately following discharge.  Leave shield in place until visit.  All paperwork with discharge instructions will be given at the office.  [facility] Address:  [address]  , [place] [address]  

## 2021-04-05 NOTE — Anesthesia Procedure Notes (Signed)
Procedure Name: MAC Date/Time: 04/05/2021 9:27 AM Performed by: Orlie Dakin, CRNA Pre-anesthesia Checklist: Patient identified, Emergency Drugs available, Suction available and Patient being monitored Patient Re-evaluated:Patient Re-evaluated prior to induction Oxygen Delivery Method: Nasal cannula Placement Confirmation: positive ETCO2

## 2021-04-06 ENCOUNTER — Encounter (HOSPITAL_COMMUNITY): Payer: Self-pay | Admitting: Ophthalmology

## 2021-04-12 DIAGNOSIS — H2512 Age-related nuclear cataract, left eye: Secondary | ICD-10-CM | POA: Diagnosis not present

## 2021-04-15 ENCOUNTER — Encounter (HOSPITAL_COMMUNITY): Payer: Self-pay

## 2021-04-15 ENCOUNTER — Other Ambulatory Visit: Payer: Self-pay

## 2021-04-15 ENCOUNTER — Encounter (HOSPITAL_COMMUNITY)
Admission: RE | Admit: 2021-04-15 | Discharge: 2021-04-15 | Disposition: A | Discharge status: Home / Self Care | Payer: HMO | Source: Ambulatory Visit | Attending: Ophthalmology | Admitting: Ophthalmology

## 2021-04-15 NOTE — H&P (Signed)
Surgical History & Physical  Patient Name: Natalie Anderson DOB: 1945/12/28  Surgery: Cataract extraction with intraocular lens implant phacoemulsification; Left Eye  Surgeon: [name] MD Surgery Date: 04-19-21 Pre-Op Date: 04-12-21  HPI: A 89 Yr. old female patient is present for PO-OD/Pre Op OS The patient is returning after cataract surgery. The right eye is affected. Status post cataract surgery, which began 1 week ago: Since the last visit, the affected area is doing well. The patient's vision is improved and stable. Patient is following medication instructions. Pt taking llevro qday and Pred TID OD. The patient complains of difficulty when driving due to glare from headlights or sun, which began 3 years ago. The left eye is affected. The episode is gradual. The condition's severity increased since last visit. Symptoms occur when the patient is driving, inside and outside. This is negatively affecting the patient's quality of life. HPI Completed by Dr. [name]  Medical History: Cataracts Macula Degeneration Glaucoma Arthritis Asthma Bone Disease Diabetes High Blood Pressure LDL  Review of Systems Negative Allergic/Immunologic Negative Cardiovascular Negative Constitutional Negative Ear, Nose, Mouth & Throat Negative Endocrine Negative Eyes Negative Gastrointestinal Negative Genitourinary Negative Hemotologic/Lymphatic Negative Integumentary Negative Musculoskeletal Negative Neurological Negative Psychiatry Negative Respiratory  Social   Never smoked   Medication Latanoprost, Moxifloxacin, Ilevro, Tylenol, Symbicort Inhalant Product, Gabapentin, Albuterol, Alendronate, Amlodipine besylate, Aspirin, Januvia, Vitamin D, Losartan Potassium, Metformin, Pravastatin,   Sx/Procedures Phaco c IOL OD, Back Surgery,   Drug Allergies  Latex, Peniciilin, Coal Tar, Strawberry,   History & Physical: Heent: Cataract, Left Eye NECK: supple without bruits LUNGS: lungs  clear to auscultation CV: regular rate and rhythm Abdomen: soft and non-tender  Impression & Plan: Assessment: 1.  CATARACT EXTRACTION STATUS; Right Eye (Z98.41) 2.  INTRAOCULAR LENS IOL (Z96.1) 3.  NUCLEAR SCLEROSIS AGE RELATED; , Left Eye (H25.12)  Plan: 1.  1 week after cataract surgery. Doing well with improved vision and normal eye pressure. Call with any problems or concerns. Stop Vigamox. Continue Ilevro 1 drop 1x/day for 3 more weeks. Continue Pred Acetate 1 drop 2x/day for 3 more weeks.  2.  Doing well since surgery Continue Post-op medications  3.  Cataract accounts for the patient's decreased vision. This visual impairment is not correctable with a tolerable change in glasses or contact lenses. Cataract surgery with an implantation of a new lens should significantly improve the visual and functional status of the patient. Discussed all risks, benefits, alternatives, and potential complications. Discussed the procedures and recovery. Patient desires to have surgery. A-scan ordered and performed today for intra-ocular lens calculations. The surgery will be performed in order to improve vision for driving, reading, and for eye examinations. Recommend phacoemulsification with intra-ocular lens. Recommend Dextenza for post-operative pain and inflammation. Left Eye. Surgery required to correct imbalance of vision. Dilates well - shugarcaine by protocol.

## 2021-04-15 NOTE — Pre-Procedure Instructions (Signed)
Left voicemail for patient to call us back for preop interview.

## 2021-04-19 ENCOUNTER — Ambulatory Visit (HOSPITAL_COMMUNITY): Payer: HMO | Admitting: Anesthesiology

## 2021-04-19 ENCOUNTER — Other Ambulatory Visit: Payer: Self-pay

## 2021-04-19 ENCOUNTER — Ambulatory Visit (HOSPITAL_COMMUNITY)
Admission: RE | Admit: 2021-04-19 | Discharge: 2021-04-19 | Disposition: A | Discharge status: Home / Self Care | Payer: HMO | Attending: Ophthalmology | Admitting: Ophthalmology

## 2021-04-19 ENCOUNTER — Encounter (HOSPITAL_COMMUNITY): Payer: Self-pay | Admitting: Ophthalmology

## 2021-04-19 ENCOUNTER — Encounter (HOSPITAL_COMMUNITY)
Admission: RE | Disposition: A | Discharge status: Home / Self Care | Payer: Self-pay | Source: Home / Self Care | Attending: Ophthalmology

## 2021-04-19 DIAGNOSIS — Z91018 Allergy to other foods: Secondary | ICD-10-CM | POA: Insufficient documentation

## 2021-04-19 DIAGNOSIS — H409 Unspecified glaucoma: Secondary | ICD-10-CM | POA: Insufficient documentation

## 2021-04-19 DIAGNOSIS — H2512 Age-related nuclear cataract, left eye: Secondary | ICD-10-CM | POA: Insufficient documentation

## 2021-04-19 DIAGNOSIS — Z7951 Long term (current) use of inhaled steroids: Secondary | ICD-10-CM | POA: Diagnosis not present

## 2021-04-19 DIAGNOSIS — Z79899 Other long term (current) drug therapy: Secondary | ICD-10-CM | POA: Insufficient documentation

## 2021-04-19 DIAGNOSIS — Z7982 Long term (current) use of aspirin: Secondary | ICD-10-CM | POA: Diagnosis not present

## 2021-04-19 DIAGNOSIS — Z7984 Long term (current) use of oral hypoglycemic drugs: Secondary | ICD-10-CM | POA: Insufficient documentation

## 2021-04-19 DIAGNOSIS — Z961 Presence of intraocular lens: Secondary | ICD-10-CM | POA: Diagnosis not present

## 2021-04-19 DIAGNOSIS — Z880 Allergy status to penicillin: Secondary | ICD-10-CM | POA: Diagnosis not present

## 2021-04-19 DIAGNOSIS — E1136 Type 2 diabetes mellitus with diabetic cataract: Secondary | ICD-10-CM | POA: Insufficient documentation

## 2021-04-19 DIAGNOSIS — Z91040 Latex allergy status: Secondary | ICD-10-CM | POA: Insufficient documentation

## 2021-04-19 DIAGNOSIS — Z9841 Cataract extraction status, right eye: Secondary | ICD-10-CM | POA: Insufficient documentation

## 2021-04-19 HISTORY — PX: CATARACT EXTRACTION W/PHACO: SHX586

## 2021-04-19 LAB — GLUCOSE, CAPILLARY: Glucose-Capillary: 99 mg/dL (ref 70–99)

## 2021-04-19 SURGERY — PHACOEMULSIFICATION, CATARACT, WITH IOL INSERTION
Anesthesia: Monitor Anesthesia Care | Site: Eye | Laterality: Left

## 2021-04-19 MED ORDER — NEOMYCIN-POLYMYXIN-DEXAMETH 3.5-10000-0.1 OP SUSP
OPHTHALMIC | Status: DC | PRN
  Administered 2021-04-19: 1 [drp] via OPHTHALMIC

## 2021-04-19 MED ORDER — PHENYLEPHRINE-KETOROLAC 1-0.3 % IO SOLN
INTRAOCULAR | Status: AC
  Filled 2021-04-19: qty 4

## 2021-04-19 MED ORDER — LIDOCAINE HCL 3.5 % OP GEL
1.0000 "application " | Freq: Once | OPHTHALMIC | Status: AC
  Administered 2021-04-19: 1 via OPHTHALMIC

## 2021-04-19 MED ORDER — SODIUM HYALURONATE 23MG/ML IO SOSY
PREFILLED_SYRINGE | INTRAOCULAR | Status: DC | PRN
  Administered 2021-04-19: 0.6 mL via INTRAOCULAR

## 2021-04-19 MED ORDER — POVIDONE-IODINE 5 % OP SOLN
OPHTHALMIC | Status: DC | PRN
  Administered 2021-04-19: 1 via OPHTHALMIC

## 2021-04-19 MED ORDER — CYCLOPENTOLATE-PHENYLEPHRINE 0.2-1 % OP SOLN: 1.0000 [drp] | OPHTHALMIC | Status: DC | PRN

## 2021-04-19 MED ORDER — EPINEPHRINE PF 1 MG/ML IJ SOLN
INTRAMUSCULAR | Status: AC
  Filled 2021-04-19: qty 1

## 2021-04-19 MED ORDER — TETRACAINE HCL 0.5 % OP SOLN
1.0000 [drp] | OPHTHALMIC | Status: AC | PRN
  Administered 2021-04-19 (×3): 1 [drp] via OPHTHALMIC

## 2021-04-19 MED ORDER — STERILE WATER FOR IRRIGATION IR SOLN
Status: DC | PRN
  Administered 2021-04-19: 250 mL

## 2021-04-19 MED ORDER — PHENYLEPHRINE HCL 2.5 % OP SOLN
1.0000 [drp] | OPHTHALMIC | Status: AC | PRN
  Administered 2021-04-19 (×3): 1 [drp] via OPHTHALMIC

## 2021-04-19 MED ORDER — TROPICAMIDE 1 % OP SOLN
1.0000 [drp] | OPHTHALMIC | Status: AC
  Administered 2021-04-19 (×3): 1 [drp] via OPHTHALMIC

## 2021-04-19 MED ORDER — PHENYLEPHRINE-KETOROLAC 1-0.3 % IO SOLN
INTRAOCULAR | Status: DC | PRN
  Administered 2021-04-19: 500 mL via OPHTHALMIC

## 2021-04-19 MED ORDER — LIDOCAINE HCL (PF) 1 % IJ SOLN
INTRAOCULAR | Status: DC | PRN
  Administered 2021-04-19: 1 mL via OPHTHALMIC

## 2021-04-19 MED ORDER — SODIUM HYALURONATE 10 MG/ML IO SOLUTION
PREFILLED_SYRINGE | INTRAOCULAR | Status: DC | PRN
  Administered 2021-04-19: 0.85 mL via INTRAOCULAR

## 2021-04-19 MED ORDER — BSS IO SOLN
INTRAOCULAR | Status: DC | PRN
  Administered 2021-04-19: 15 mL via INTRAOCULAR

## 2021-04-19 SURGICAL SUPPLY — 11 items
CLOTH BEACON ORANGE TIMEOUT ST (SAFETY) ×2 IMPLANT
EYE SHIELD UNIVERSAL CLEAR (GAUZE/BANDAGES/DRESSINGS) ×2 IMPLANT
GLOVE SURG UNDER POLY LF SZ6.5 (GLOVE) ×2 IMPLANT
GLOVE SURG UNDER POLY LF SZ7 (GLOVE) ×2 IMPLANT
NEEDLE HYPO 18GX1.5 BLUNT FILL (NEEDLE) ×2 IMPLANT
PAD ARMBOARD 7.5X6 YLW CONV (MISCELLANEOUS) ×2 IMPLANT
SYR TB 1ML LL NO SAFETY (SYRINGE) ×2 IMPLANT
TAPE SURG TRANSPORE 1 IN (GAUZE/BANDAGES/DRESSINGS) ×1 IMPLANT
TAPE SURGICAL TRANSPORE 1 IN (GAUZE/BANDAGES/DRESSINGS) ×2
TECNIS I PIECE INTRAOCULAR LENS (Intraocular Lens) ×2 IMPLANT
WATER STERILE IRR 250ML POUR (IV SOLUTION) ×2 IMPLANT

## 2021-04-19 NOTE — Op Note (Signed)
Date of procedure: 04/19/21  Pre-operative diagnosis: Visually significant age-related nuclear cataract, Left Eye (H25.12)  Post-operative diagnosis: Visually significant age-related nuclear cataract, Left Eye  Procedure: Removal of cataract via phacoemulsification and insertion of intra-ocular lens Wynetta Emery and Johnson DCB00 +21.5D into the capsular bag of the Left Eye  Attending surgeon: Gerda Diss. Neal Trulson, MD, MA  Anesthesia: MAC, Topical Akten  Complications: None  Estimated Blood Loss: <68m (minimal)  Specimens: None  Implants: As above  Indications:  Visually significant age-related cataract, Left Eye  Procedure:  The patient was seen and identified in the pre-operative area. The operative eye was identified and dilated.  The operative eye was marked.  Topical anesthesia was administered to the operative eye.     The patient was then to the operative suite and placed in the supine position.  A timeout was performed confirming the patient, procedure to be performed, and all other relevant information.   The patient's face was prepped and draped in the usual fashion for intra-ocular surgery.  A lid speculum was placed into the operative eye and the surgical microscope moved into place and focused.  An inferotemporal paracentesis was created using a 20 gauge paracentesis blade.  Shugarcaine was injected into the anterior chamber.  Viscoelastic was injected into the anterior chamber.  A temporal clear-corneal main wound incision was created using a 2.436mmicrokeratome.  A continuous curvilinear capsulorrhexis was initiated using an irrigating cystitome and completed using capsulorrhexis forceps.  Hydrodissection and hydrodeliniation were performed.  Viscoelastic was injected into the anterior chamber.  A phacoemulsification handpiece and a chopper as a second instrument were used to remove the nucleus and epinucleus. The irrigation/aspiration handpiece was used to remove any remaining  cortical material.   The capsular bag was reinflated with viscoelastic, checked, and found to be intact.  The intraocular lens was inserted into the capsular bag.  The irrigation/aspiration handpiece was used to remove any remaining viscoelastic.  The clear corneal wound and paracentesis wounds were then hydrated and checked with Weck-Cels to be watertight.  The lid-speculum and drape was removed, and the patient's face was cleaned with a wet and dry 4x4.  Maxitrol was instilled in the eye before a clear shield was taped over the eye. The patient was taken to the post-operative care unit in good condition, having tolerated the procedure well.  Post-Op Instructions: [facility] and will receive all other orders and instructions.

## 2021-04-19 NOTE — Transfer of Care (Signed)
Immediate Anesthesia Transfer of Care Note  Patient: Natalie Anderson  Procedure(s) Performed: CATARACT EXTRACTION PHACO AND INTRAOCULAR LENS PLACEMENT LEFT EYE (Left: Eye)  Patient Location: Short Stay  Anesthesia Type:MAC  Level of Consciousness: awake, alert  and oriented  Airway & Oxygen Therapy: Patient Spontanous Breathing  Post-op Assessment: Report given to RN and Post -op Vital signs reviewed and stable  Post vital signs: Reviewed and stable  Last Vitals:  Vitals Value Taken Time  BP    Temp    Pulse    Resp    SpO2      Last Pain:  Vitals:   04/19/21 1254  TempSrc: Oral  PainSc: 5       Patients Stated Pain Goal: 8 (57/84/69 [id number])  Complications: No notable events documented.

## 2021-04-19 NOTE — Discharge Instructions (Signed)
Please discharge patient when stable, will follow up today with Dr. Dylyn Mclaren at the [facility] office immediately following discharge.  Leave shield in place until visit.  All paperwork with discharge instructions will be given at the office.  [facility] Address:  [address]  [place], [place] [address]  

## 2021-04-19 NOTE — Interval H&P Note (Signed)
History and Physical Interval Note:  04/19/2021 1:12 PM  Natalie Anderson  has presented today for surgery, with the diagnosis of nuclear cataract left eye.  The various methods of treatment have been discussed with the patient and family. After consideration of risks, benefits and other options for treatment, the patient has consented to  Procedure(s) with comments: CATARACT EXTRACTION PHACO AND INTRAOCULAR LENS PLACEMENT ([place]) (Left) - left as a surgical intervention.  The patient's history has been reviewed, patient examined, no change in status, stable for surgery.  I have reviewed the patient's chart and labs.  Questions were answered to the patient's satisfaction.     [name]

## 2021-04-19 NOTE — Anesthesia Postprocedure Evaluation (Signed)
Anesthesia Post Note  Patient: Natalie Anderson  Procedure(s) Performed: CATARACT EXTRACTION PHACO AND INTRAOCULAR LENS PLACEMENT LEFT EYE (Left: Eye)  Patient location during evaluation: Phase II Anesthesia Type: MAC Level of consciousness: awake and alert and oriented Pain management: pain level controlled Vital Signs Assessment: post-procedure vital signs reviewed and stable Respiratory status: respiratory function stable and spontaneous breathing Cardiovascular status: blood pressure returned to baseline and stable Postop Assessment: no apparent nausea or vomiting Anesthetic complications: no   No notable events documented.   Last Vitals:  Vitals:   04/19/21 1254 04/19/21 1344  BP: 138/70 (!) 146/76  Pulse: 83 90  Resp: 11 16  Temp: 36.9 C 36.6 C  SpO2: 99% 98%    Last Pain:  Vitals:   04/19/21 1344  TempSrc: Oral  PainSc: 4                  Thamar Holik C Mayerli Kirst

## 2021-04-19 NOTE — Anesthesia Preprocedure Evaluation (Signed)
Anesthesia Evaluation  Patient identified by MRN, date of birth, ID band Patient awake    Reviewed: Allergy & Precautions, NPO status , Patient's Chart, lab work & pertinent test results  History of Anesthesia Complications (+) history of anesthetic complications  Airway Mallampati: II  TM Distance: >3 FB Neck ROM: Full    Dental  (+) Upper Dentures, Lower Dentures   Pulmonary shortness of breath and with exertion, asthma , pneumonia, COPD,  COPD inhaler,    Pulmonary exam normal breath sounds clear to auscultation       Cardiovascular hypertension, Pt. on medications Normal cardiovascular exam Rhythm:Regular Rate:Normal     Neuro/Psych  Headaches,  Neuromuscular disease negative psych ROS   GI/Hepatic Neg liver ROS, GERD  Medicated and Controlled,  Endo/Other  diabetes, Well Controlled, Type 2, Oral Hypoglycemic Agents  Renal/GU negative Renal ROS     Musculoskeletal  (+) Arthritis , Osteoarthritis,    Abdominal   Peds  Hematology   Anesthesia Other Findings   Reproductive/Obstetrics                            Anesthesia Physical Anesthesia Plan  ASA: 3  Anesthesia Plan: MAC   Post-op Pain Management:    Induction:   PONV Risk Score and Plan:   Airway Management Planned: Nasal Cannula and Natural Airway  Additional Equipment:   Intra-op Plan:   Post-operative Plan:   Informed Consent: I have reviewed the patients History and Physical, chart, labs and discussed the procedure including the risks, benefits and alternatives for the proposed anesthesia with the patient or authorized representative who has indicated his/her understanding and acceptance.     Dental advisory given  Plan Discussed with: CRNA and Surgeon  Anesthesia Plan Comments:         Anesthesia Quick Evaluation

## 2021-04-20 ENCOUNTER — Encounter (HOSPITAL_COMMUNITY): Payer: Self-pay | Admitting: Ophthalmology

## 2021-04-23 DIAGNOSIS — E1165 Type 2 diabetes mellitus with hyperglycemia: Secondary | ICD-10-CM | POA: Diagnosis not present

## 2021-05-24 DIAGNOSIS — E1165 Type 2 diabetes mellitus with hyperglycemia: Secondary | ICD-10-CM | POA: Diagnosis not present

## 2021-06-14 DIAGNOSIS — R5383 Other fatigue: Secondary | ICD-10-CM | POA: Diagnosis not present

## 2021-06-14 DIAGNOSIS — Z299 Encounter for prophylactic measures, unspecified: Secondary | ICD-10-CM | POA: Diagnosis not present

## 2021-06-14 DIAGNOSIS — E1165 Type 2 diabetes mellitus with hyperglycemia: Secondary | ICD-10-CM | POA: Diagnosis not present

## 2021-06-14 DIAGNOSIS — K5900 Constipation, unspecified: Secondary | ICD-10-CM | POA: Diagnosis not present

## 2021-06-14 DIAGNOSIS — I10 Essential (primary) hypertension: Secondary | ICD-10-CM | POA: Diagnosis not present

## 2021-06-21 ENCOUNTER — Encounter: Payer: Self-pay | Admitting: Gastroenterology

## 2021-06-23 DIAGNOSIS — E1165 Type 2 diabetes mellitus with hyperglycemia: Secondary | ICD-10-CM | POA: Diagnosis not present

## 2021-07-23 DIAGNOSIS — E1165 Type 2 diabetes mellitus with hyperglycemia: Secondary | ICD-10-CM | POA: Diagnosis not present

## 2021-08-22 DIAGNOSIS — E1165 Type 2 diabetes mellitus with hyperglycemia: Secondary | ICD-10-CM | POA: Diagnosis not present

## 2021-09-20 DIAGNOSIS — E1129 Type 2 diabetes mellitus with other diabetic kidney complication: Secondary | ICD-10-CM | POA: Diagnosis not present

## 2021-09-20 DIAGNOSIS — R809 Proteinuria, unspecified: Secondary | ICD-10-CM | POA: Diagnosis not present

## 2021-09-20 DIAGNOSIS — J45901 Unspecified asthma with (acute) exacerbation: Secondary | ICD-10-CM | POA: Diagnosis not present

## 2021-09-20 DIAGNOSIS — E1165 Type 2 diabetes mellitus with hyperglycemia: Secondary | ICD-10-CM | POA: Diagnosis not present

## 2021-09-20 DIAGNOSIS — Z6825 Body mass index (BMI) 25.0-25.9, adult: Secondary | ICD-10-CM | POA: Diagnosis not present

## 2021-09-20 DIAGNOSIS — I10 Essential (primary) hypertension: Secondary | ICD-10-CM | POA: Diagnosis not present

## 2021-09-20 DIAGNOSIS — Z299 Encounter for prophylactic measures, unspecified: Secondary | ICD-10-CM | POA: Diagnosis not present

## 2021-09-20 DIAGNOSIS — Z789 Other specified health status: Secondary | ICD-10-CM | POA: Diagnosis not present

## 2021-09-21 DIAGNOSIS — E1165 Type 2 diabetes mellitus with hyperglycemia: Secondary | ICD-10-CM | POA: Diagnosis not present

## 2021-09-28 DIAGNOSIS — E2839 Other primary ovarian failure: Secondary | ICD-10-CM | POA: Diagnosis not present

## 2021-10-21 DIAGNOSIS — E1165 Type 2 diabetes mellitus with hyperglycemia: Secondary | ICD-10-CM | POA: Diagnosis not present

## 2021-10-25 DIAGNOSIS — H01001 Unspecified blepharitis right upper eyelid: Secondary | ICD-10-CM | POA: Diagnosis not present

## 2021-10-25 DIAGNOSIS — H26493 Other secondary cataract, bilateral: Secondary | ICD-10-CM | POA: Diagnosis not present

## 2021-10-25 DIAGNOSIS — H01004 Unspecified blepharitis left upper eyelid: Secondary | ICD-10-CM | POA: Diagnosis not present

## 2021-10-25 DIAGNOSIS — H01002 Unspecified blepharitis right lower eyelid: Secondary | ICD-10-CM | POA: Diagnosis not present

## 2021-10-25 NOTE — Progress Notes (Signed)
? ?Referring Provider: Monico Blitz, MD ?Primary Care Physician:  Monico Blitz, MD ?Primary Gastroenterologist:  Dr. Gala Romney ? ?Chief Complaint  ?Patient presents with  ? Colonoscopy  ? ? ?HPI:   ?Natalie Anderson is a 76 y.o. female presenting today at the request of Monico Blitz, MD for consult colonoscopy.  Per referral information, patient had positive fit test in September 2022.  ? ?Today:  ?No brbpr or melena. No constipation, diarrhea, unintentional weight loss, abdominal pain.  ? ?Reports dysphagia to breads and meats once in a while.  Foods get hung in the lower esophagus.  First started about 1 year ago. Reflux symptoms about once a month or 2. Takes pepcid or other OTC antiacid as needed.  ? ?Takes Aleve every now and then for HAs. Fairly rare.  ?Fosamax for osteoporosis.  ? ?No prior TCS or EGD.  ? ?Nephew 71 and niece 38 passed from colon cancer. Paternal grandmother 80 passed from colon cancer. Cousins on dads side aged 73, 70, 47 passed from colon cancer. Was told it was hereditary. Mother passed from lung cancer. Brother passed from lung cancer. Sister had polyps.  ? ?Chronic SOB. No oxygen requirement. No chest pain or palpitations.  ? ?Past Medical History:  ?Diagnosis Date  ? Arthritis   ? Asthma   ? as a child  ? Blood transfusion 07/25/1977  ? Cataract immature   ? bilateral  ? Chronic back pain   ? spondylosis and stenosis  ? Complication of anesthesia   ? COPD (chronic obstructive pulmonary disease) ([place])   ? Diabetes mellitus   ? takes Metformin bid and Levimir bid and Victoza daily  ? Dizziness   ? occasionally  ? Dry skin   ? and itchy   ? Fibroid tumor   ? GERD (gastroesophageal reflux disease)   ? takes Protonix daily  ? History of shingles 15-33yr ago  ? Hyperlipidemia   ? takes Lipitor occasionally  ? Hypertension   ? takes Losartan daily  ? Impaired hearing   ? Joint pain   ? Joint swelling   ? Migraine   ? last one 3 wks ago  ? Nocturia   ? Osteoporosis   ? Osteoporosis   ? Pneumonia   ?  hx of-double as a child  ? Seasonal allergies   ? takes Claritin daily  ? Shortness of breath   ? with exertion  ? Urinary frequency   ? UTI (lower urinary tract infection)   ? hx of  ? Vertigo   ? hx of  ? Vitamin D deficiency   ? takes Vit D 50,000units on Sat  ? ? ?Past Surgical History:  ?Procedure Laterality Date  ? BACK SURGERY  12/24/2010  ? x2  ? CATARACT EXTRACTION W/PHACO Right 04/05/2021  ? Procedure: CATARACT EXTRACTION PHACO AND INTRAOCULAR LENS PLACEMENT (IOC);  Surgeon: WBaruch Goldmann MD;  Location: [facility];  Service: Ophthalmology;  Laterality: Right;  CDE 5.98  ? CATARACT EXTRACTION W/PHACO Left 04/19/2021  ? Procedure: CATARACT EXTRACTION PHACO AND INTRAOCULAR LENS PLACEMENT LEFT EYE;  Surgeon: WBaruch Goldmann MD;  Location: [facility];  Service: Ophthalmology;  Laterality: Left;  left ?CDE=5.90  ? DILATION AND CURETTAGE OF UTERUS  07/25/1977  ? ? ?Current Outpatient Medications  ?Medication Sig Dispense Refill  ? acetaminophen (TYLENOL) 500 MG tablet Take 500 mg by mouth every 6 (six) hours as needed (for pain.).    ? albuterol (PROVENTIL HFA;VENTOLIN HFA) 108 (90 BASE) MCG/ACT inhaler Inhale 2 puffs  into the lungs every 6 (six) hours as needed for wheezing.    ? alendronate (FOSAMAX) 70 MG tablet Take 70 mg by mouth every Monday. Take with a full glass of water on an empty stomach.    ? amLODipine (NORVASC) 5 MG tablet Take 5 mg by mouth every evening.    ? aspirin EC 81 MG tablet Take 81 mg by mouth in the morning. Swallow whole.    ? budesonide-formoterol (SYMBICORT) 160-4.5 MCG/ACT inhaler Inhale 2 puffs into the lungs in the morning.    ? Cholecalciferol (VITAMIN D-3) 125 MCG (5000 UT) TABS Take 5,000 Units by mouth every evening.    ? famotidine (PEPCID) 20 MG tablet Take 20 mg by mouth 2 (two) times daily as needed for heartburn or indigestion.    ? gabapentin (NEURONTIN) 300 MG capsule Take 600 mg by mouth 3 (three) times daily.    ? loratadine (CLARITIN) 10 MG tablet Take 10 mg by mouth daily  as needed for allergies.    ? losartan (COZAAR) 100 MG tablet Take 100 mg by mouth in the morning.    ? metFORMIN (GLUCOPHAGE) 500 MG tablet Take 1,000 mg by mouth 2 (two) times daily with a meal.    ? mometasone (NASONEX) 50 MCG/ACT nasal spray Place 2 sprays into the nose in the morning.    ? moxifloxacin (VIGAMOX) 0.5 % ophthalmic solution Place 1 drop into the right eye in the morning and at bedtime.    ? naproxen sodium (ALEVE) 220 MG tablet Take 220 mg by mouth 2 (two) times daily as needed (pain.).    ? nepafenac (ILEVRO) 0.3 % ophthalmic suspension Place 1 drop into the right eye in the morning and at bedtime.    ? prednisoLONE acetate (PRED FORTE) 1 % ophthalmic suspension Place 1 drop into the right eye in the morning, at noon, and at bedtime.    ? rosuvastatin (CRESTOR) 5 MG tablet Take 5 mg by mouth every Monday.    ? sitaGLIPtin (JANUVIA) 100 MG tablet Take 100 mg by mouth in the morning.    ? TRESIBA FLEXTOUCH 200 UNIT/ML FlexTouch Pen Inject 40 Units into the skin in the morning.    ? ?No current facility-administered medications for this visit.  ? ? ?Allergies as of 10/27/2021 - Review Complete 10/27/2021  ?Allergen Reaction Noted  ? Latex Rash 10/04/2011  ? Morphine and related Itching and Nausea And Vomiting 11/02/2011  ? Other Hives and Rash 10/04/2011  ? Prednisone  04/01/2021  ? Nickel Rash 10/28/2011  ? ? ?Family History  ?Problem Relation Age of Onset  ? Lung cancer Mother   ? Colon polyps Sister   ? Lung cancer Brother   ? Colon cancer Paternal Grandmother   ?     49  ? Colon cancer Cousin 2  ? Colon cancer Cousin 61  ? Colon cancer Cousin 21  ? Colon cancer Nephew 35  ? Colon cancer Niece 62  ? Anesthesia problems Neg Hx   ? Hypotension Neg Hx   ? Malignant hyperthermia Neg Hx   ? Pseudochol deficiency Neg Hx   ? ? ?Social History  ? ?Socioeconomic History  ? Marital status: Widowed  ?  Spouse name: Not on file  ? Number of children: Not on file  ? Years of education: Not on file  ? Highest  education level: Not on file  ?Occupational History  ? Not on file  ?Tobacco Use  ? Smoking status: Never  ?  Smokeless tobacco: Never  ?Vaping Use  ? Vaping Use: Never used  ?Substance and Sexual Activity  ? Alcohol use: No  ? Drug use: No  ? Sexual activity: Never  ?Other Topics Concern  ? Not on file  ?Social History Narrative  ? Not on file  ? ?Social Determinants of Health  ? ?Financial Resource Strain: Not on file  ?Food Insecurity: Not on file  ?Transportation Needs: Not on file  ?Physical Activity: Not on file  ?Stress: Not on file  ?Social Connections: Not on file  ?Intimate Partner Violence: Not on file  ? ? ?Review of Systems: ?Gen: Denies any fever, chills, cold or flulike symptoms, presyncope, syncope. ?CV: See HPI ?Resp: See HPI ?GI: See HPI ?GU : Denies urinary burning, urinary frequency, urinary hesitancy ?MS: Admits to back pain. ?Derm: Denies rash. ?Psych: Denies depression, anxiety. ?Heme: See HPI ? ?Physical Exam: ?BP (!) 150/75   Pulse 90   Temp 98.1 ?F (36.7 ?C) (Temporal)   Ht '5\' 2"'$  (1.575 m)   Wt 144 lb 12.8 oz (65.7 kg)   BMI 26.48 kg/m?  ?General:   Alert and oriented. Pleasant and cooperative. Well-nourished and well-developed.  ?Head:  Normocephalic and atraumatic. ?Eyes:  Without icterus, sclera clear and conjunctiva pink.  ?Ears:  Normal auditory acuity. ?Lungs:  Clear to auscultation bilaterally. No wheezes, rales, or rhonchi. No distress.  ?Heart:  S1, S2 present without murmurs appreciated.  ?Abdomen:  +BS, soft, non-tender and non-distended. No HSM noted. No guarding or rebound. No masses appreciated.  ?Rectal:  Deferred  ?Msk:  Symmetrical without gross deformities. Normal posture. ?Extremities:  Without edema. ?Neurologic:  Alert and  oriented x4;  grossly normal neurologically. ?Skin:  Intact without significant lesions or rashes. ?Psych:  Normal mood and affect. ? ? ? ?Assessment:  ?76 year old female with history of COPD, type 2 diabetes, HTN, HLD, osteoporosis, presenting  today at the request of Dr. Manuella Ghazi to discuss scheduling colonoscopy due to positive fit test.  Also discussed dysphagia. ? ?Positive fit test: ?Per referral information, fit test was positive in September 20

## 2021-10-27 ENCOUNTER — Other Ambulatory Visit: Payer: Self-pay

## 2021-10-27 ENCOUNTER — Encounter: Payer: Self-pay | Admitting: Gastroenterology

## 2021-10-27 ENCOUNTER — Ambulatory Visit: Payer: HMO | Admitting: Gastroenterology

## 2021-10-27 VITALS — BP 150/75 | HR 90 | Temp 98.1°F | Ht 62.0 in | Wt 144.8 lb

## 2021-10-27 DIAGNOSIS — R1310 Dysphagia, unspecified: Secondary | ICD-10-CM | POA: Insufficient documentation

## 2021-10-27 DIAGNOSIS — R195 Other fecal abnormalities: Secondary | ICD-10-CM

## 2021-10-27 MED ORDER — PEG 3350-KCL-NA BICARB-NACL 420 G PO SOLR: 4000.0000 mL | ORAL | 0 refills | Status: DC

## 2021-10-27 NOTE — Patient Instructions (Signed)
We will arrange for you to have an upper endoscopy with possible dilation of your esophagus and colonoscopy in the near future with Dr. Gala Romney. ?1 day prior to procedure: Take one half dose of metformin (500 mg in the morning and evening), one half dose of Tresiba (20 units in the morning), take Januvia as prescribed. ?Day of procedure: Do not take any morning diabetes medications. ? ?While you are on clear liquid diet, monitor your blood sugars closely and correct any low blood sugars with improved sugary clear liquids. ? ?To help with your swallowing problems:  ?Eat slowly, take small bites, chew thoroughly, drink plenty of liquids throughout meals.  ?Avoid trough textures ?All meats should be chopped finely.  ?If something gets hung in your esophagus and will not come up or go down, proceed to the emergency room.   ? ? ?We will follow-up with you in the office after your procedures.  Do not hesitate to call if you have any questions or concerns prior. ? ?It was very nice meeting you today! Have a Happy Easter!  ? ?Aliene Altes, PA-C ?[facility] ? ?

## 2021-11-21 DIAGNOSIS — E1165 Type 2 diabetes mellitus with hyperglycemia: Secondary | ICD-10-CM | POA: Diagnosis not present

## 2021-12-06 NOTE — Patient Instructions (Signed)
? ? ? ? ? ? ? ? ? ? Natalie Anderson ? 12/06/2021  ?  ? '@PREFPERIOPPHARMACY'$ @ ? ? Your procedure is scheduled on  12/09/2021. ? ? Report to [facility] at  [phone] A.M. ? ? Call this number if you have problems the morning of surgery: ? [phone] ? ? Remember: ? Follow the diet and prep instructions given to you by the office. ? ?  DO NOT take any medications for diabetes the morning of your procedure. ? ?  Use your inhalers before you come and bring your rescue inhaler with you. ? ?  ? Take these medicines the morning of surgery with A SIP OF WATER  ? ?                          pepcid, gabapentin, claritin. ?  ? ? Do not wear jewelry, make-up or nail polish. ? Do not wear lotions, powders, or perfumes, or deodorant. ? Do not shave 48 hours prior to surgery.  Men may shave face and neck. ? Do not bring valuables to the hospital. ? [facility] is not responsible for any belongings or valuables. ? ?Contacts, dentures or bridgework may not be worn into surgery.  Leave your suitcase in the car.  After surgery it may be brought to your room. ? ?For patients admitted to the hospital, discharge time will be determined by your treatment team. ? ?Patients discharged the day of surgery will not be allowed to drive home and must have someone with them for 24 hours.  ? ? ?Special instructions:   DO NOT smoke tobacco or vape for 24 hours before your procedure. ? ?Please read over the following fact sheets that you were given. ?Anesthesia Post-op Instructions and Care and Recovery After Surgery ?  ? ? ? Upper Endoscopy, Adult, Care After ?This sheet gives you information about how to care for yourself after your procedure. Your health care provider may also give you more specific instructions. If you have problems or questions, contact your health care provider. ?What can I expect after the procedure? ?After the procedure, it is common to have: ?A sore throat. ?Mild stomach pain or discomfort. ?Bloating. ?Nausea. ?Follow these  instructions at home: ? ?Follow instructions from your health care provider about what to eat or drink after your procedure. ?Return to your normal activities as told by your health care provider. Ask your health care provider what activities are safe for you. ?Take over-the-counter and prescription medicines only as told by your health care provider. ?If you were given a sedative during the procedure, it can affect you for several hours. Do not drive or operate machinery until your health care provider says that it is safe. ?Keep all follow-up visits as told by your health care provider. This is important. ?Contact a health care provider if you have: ?A sore throat that lasts longer than one day. ?Trouble swallowing. ?Get help right away if: ?You vomit blood or your vomit looks like coffee grounds. ?You have: ?A fever. ?Bloody, black, or tarry stools. ?A severe sore throat or you cannot swallow. ?Difficulty breathing. ?Severe pain in your chest or abdomen. ?Summary ?After the procedure, it is common to have a sore throat, mild stomach discomfort, bloating, and nausea. ?If you were given a sedative during the procedure, it can affect you for several hours. Do not drive or operate machinery until your health care provider says that it is safe. ?Follow instructions  from your health care provider about what to eat or drink after your procedure. ?Return to your normal activities as told by your health care provider. ?This information is not intended to replace advice given to you by your health care provider. Make sure you discuss any questions you have with your health care provider. ?Document Revised: 05/17/2019 Document Reviewed: 12/11/2017 ?Elsevier Patient Education ? Okay. ?Esophageal Dilatation ?Esophageal dilatation, also called esophageal dilation, is a procedure to widen or open a blocked or narrowed part of the esophagus. The esophagus is the part of the body that moves food and liquid from the  mouth to the stomach. You may need this procedure if: ?You have a buildup of scar tissue in your esophagus that makes it difficult, painful, or impossible to swallow. This can be caused by gastroesophageal reflux disease (GERD). ?You have cancer of the esophagus. ?There is a problem with how food moves through your esophagus. ?In some cases, you may need this procedure repeated at a later time to dilate the esophagus gradually. ?Tell a health care provider about: ?Any allergies you have. ?All medicines you are taking, including vitamins, herbs, eye drops, creams, and over-the-counter medicines. ?Any problems you or family members have had with anesthetic medicines. ?Any blood disorders you have. ?Any surgeries you have had. ?Any medical conditions you have. ?Any antibiotic medicines you are required to take before dental procedures. ?Whether you are pregnant or may be pregnant. ?What are the risks? ?Generally, this is a safe procedure. However, problems may occur, including: ?Bleeding due to a tear in the lining of the esophagus. ?A hole, or perforation, in the esophagus. ?What happens before the procedure? ?Ask your health care provider about: ?Changing or stopping your regular medicines. This is especially important if you are taking diabetes medicines or blood thinners. ?Taking medicines such as aspirin and ibuprofen. These medicines can thin your blood. Do not take these medicines unless your health care provider tells you to take them. ?Taking over-the-counter medicines, vitamins, herbs, and supplements. ?Follow instructions from your health care provider about eating or drinking restrictions. ?Plan to have a responsible adult take you home from the hospital or clinic. ?Plan to have a responsible adult care for you for the time you are told after you leave the hospital or clinic. This is important. ?What happens during the procedure? ?You may be given a medicine to help you relax (sedative). ?A numbing medicine  may be sprayed into the back of your throat, or you may gargle the medicine. ?Your health care provider may perform the dilatation using various surgical instruments, such as: ?Simple dilators. This instrument is carefully placed in the esophagus to stretch it. ?Guided wire bougies. This involves using an endoscope to insert a wire into the esophagus. A dilator is passed over this wire to enlarge the esophagus. Then the wire is removed. ?Balloon dilators. An endoscope with a small balloon is inserted into the esophagus. The balloon is inflated to stretch the esophagus and open it up. ?The procedure may vary among health care providers and hospitals. ?What can I expect after the procedure? ?Your blood pressure, heart rate, breathing rate, and blood oxygen level will be monitored until you leave the hospital or clinic. ?Your throat may feel slightly sore and numb. This will get better over time. ?You will not be allowed to eat or drink until your throat is no longer numb. ?When you are able to drink, urinate, and sit on the edge of the bed without  nausea or dizziness, you may be able to return home. ?Follow these instructions at home: ?Take over-the-counter and prescription medicines only as told by your health care provider. ?If you were given a sedative during the procedure, it can affect you for several hours. Do not drive or operate machinery until your health care provider says that it is safe. ?Plan to have a responsible adult care for you for the time you are told. This is important. ?Follow instructions from your health care provider about any eating or drinking restrictions. ?Do not use any products that contain nicotine or tobacco, such as cigarettes, e-cigarettes, and chewing tobacco. If you need help quitting, ask your health care provider. ?Keep all follow-up visits. This is important. ?Contact a health care provider if: ?You have a fever. ?You have pain that is not relieved by medicine. ?Get help right  away if: ?You have chest pain. ?You have trouble breathing. ?You have trouble swallowing. ?You vomit blood. ?You have black, tarry, or bloody stools. ?These symptoms may represent a serious problem that is an

## 2021-12-07 ENCOUNTER — Encounter (HOSPITAL_COMMUNITY)
Admission: RE | Admit: 2021-12-07 | Discharge: 2021-12-07 | Disposition: A | Discharge status: Home / Self Care | Payer: HMO | Source: Ambulatory Visit | Attending: Internal Medicine | Admitting: Internal Medicine

## 2021-12-07 ENCOUNTER — Encounter (HOSPITAL_COMMUNITY): Payer: Self-pay

## 2021-12-07 VITALS — BP 141/75 | HR 81 | Temp 97.8°F | Resp 18 | Ht 62.0 in | Wt 141.0 lb

## 2021-12-07 DIAGNOSIS — I10 Essential (primary) hypertension: Secondary | ICD-10-CM | POA: Diagnosis not present

## 2021-12-07 DIAGNOSIS — Z01818 Encounter for other preprocedural examination: Secondary | ICD-10-CM | POA: Insufficient documentation

## 2021-12-07 DIAGNOSIS — E119 Type 2 diabetes mellitus without complications: Secondary | ICD-10-CM | POA: Diagnosis not present

## 2021-12-07 HISTORY — DX: Other specified postprocedural states: R11.2

## 2021-12-07 HISTORY — DX: Other specified postprocedural states: Z98.890

## 2021-12-07 LAB — BASIC METABOLIC PANEL
Anion gap: 8 (ref 5–15)
BUN: 9 mg/dL (ref 8–23)
CO2: 24 mmol/L (ref 22–32)
Calcium: 9 mg/dL (ref 8.9–10.3)
Chloride: 105 mmol/L (ref 98–111)
Creatinine, Ser: 0.84 mg/dL (ref 0.44–1.00)
GFR, Estimated: >60 mL/min (ref >60)
Glucose, Bld: 131 mg/dL — ABNORMAL HIGH (ref 70–99)
Potassium: 3.9 mmol/L (ref 3.5–5.1)
Sodium: 137 mmol/L (ref 135–145)

## 2021-12-09 ENCOUNTER — Ambulatory Visit (HOSPITAL_BASED_OUTPATIENT_CLINIC_OR_DEPARTMENT_OTHER): Payer: HMO | Admitting: Anesthesiology

## 2021-12-09 ENCOUNTER — Other Ambulatory Visit: Payer: Self-pay

## 2021-12-09 ENCOUNTER — Ambulatory Visit (HOSPITAL_COMMUNITY): Payer: HMO | Admitting: Anesthesiology

## 2021-12-09 ENCOUNTER — Encounter (HOSPITAL_COMMUNITY)
Admission: RE | Disposition: A | Discharge status: Home / Self Care | Payer: Self-pay | Source: Home / Self Care | Attending: Internal Medicine

## 2021-12-09 ENCOUNTER — Encounter (HOSPITAL_COMMUNITY): Payer: Self-pay | Admitting: Internal Medicine

## 2021-12-09 ENCOUNTER — Ambulatory Visit (HOSPITAL_COMMUNITY)
Admission: RE | Admit: 2021-12-09 | Discharge: 2021-12-09 | Disposition: A | Discharge status: Home / Self Care | Payer: HMO | Attending: Internal Medicine | Admitting: Internal Medicine

## 2021-12-09 DIAGNOSIS — I10 Essential (primary) hypertension: Secondary | ICD-10-CM | POA: Insufficient documentation

## 2021-12-09 DIAGNOSIS — Z7984 Long term (current) use of oral hypoglycemic drugs: Secondary | ICD-10-CM | POA: Insufficient documentation

## 2021-12-09 DIAGNOSIS — Z794 Long term (current) use of insulin: Secondary | ICD-10-CM | POA: Diagnosis not present

## 2021-12-09 DIAGNOSIS — E119 Type 2 diabetes mellitus without complications: Secondary | ICD-10-CM | POA: Insufficient documentation

## 2021-12-09 DIAGNOSIS — R1314 Dysphagia, pharyngoesophageal phase: Secondary | ICD-10-CM | POA: Diagnosis not present

## 2021-12-09 DIAGNOSIS — Z1211 Encounter for screening for malignant neoplasm of colon: Secondary | ICD-10-CM | POA: Diagnosis not present

## 2021-12-09 DIAGNOSIS — Z800 Family history of malignant neoplasm of digestive organs: Secondary | ICD-10-CM | POA: Insufficient documentation

## 2021-12-09 DIAGNOSIS — R195 Other fecal abnormalities: Secondary | ICD-10-CM | POA: Insufficient documentation

## 2021-12-09 DIAGNOSIS — K219 Gastro-esophageal reflux disease without esophagitis: Secondary | ICD-10-CM | POA: Diagnosis not present

## 2021-12-09 DIAGNOSIS — K222 Esophageal obstruction: Secondary | ICD-10-CM | POA: Diagnosis not present

## 2021-12-09 DIAGNOSIS — D120 Benign neoplasm of cecum: Secondary | ICD-10-CM | POA: Insufficient documentation

## 2021-12-09 DIAGNOSIS — D125 Benign neoplasm of sigmoid colon: Secondary | ICD-10-CM

## 2021-12-09 DIAGNOSIS — Z8371 Family history of colonic polyps: Secondary | ICD-10-CM | POA: Insufficient documentation

## 2021-12-09 DIAGNOSIS — Z79899 Other long term (current) drug therapy: Secondary | ICD-10-CM | POA: Diagnosis not present

## 2021-12-09 DIAGNOSIS — J45909 Unspecified asthma, uncomplicated: Secondary | ICD-10-CM | POA: Diagnosis not present

## 2021-12-09 DIAGNOSIS — K635 Polyp of colon: Secondary | ICD-10-CM

## 2021-12-09 DIAGNOSIS — K449 Diaphragmatic hernia without obstruction or gangrene: Secondary | ICD-10-CM

## 2021-12-09 DIAGNOSIS — J449 Chronic obstructive pulmonary disease, unspecified: Secondary | ICD-10-CM | POA: Diagnosis not present

## 2021-12-09 DIAGNOSIS — R1310 Dysphagia, unspecified: Secondary | ICD-10-CM

## 2021-12-09 DIAGNOSIS — D123 Benign neoplasm of transverse colon: Secondary | ICD-10-CM | POA: Diagnosis not present

## 2021-12-09 DIAGNOSIS — D124 Benign neoplasm of descending colon: Secondary | ICD-10-CM

## 2021-12-09 HISTORY — PX: COLONOSCOPY WITH PROPOFOL: SHX5780

## 2021-12-09 HISTORY — PX: ESOPHAGOGASTRODUODENOSCOPY (EGD) WITH PROPOFOL: SHX5813

## 2021-12-09 HISTORY — PX: POLYPECTOMY: SHX5525

## 2021-12-09 HISTORY — PX: HEMOSTASIS CLIP PLACEMENT: SHX6857

## 2021-12-09 HISTORY — PX: MALONEY DILATION: SHX5535

## 2021-12-09 LAB — GLUCOSE, CAPILLARY: Glucose-Capillary: 112 mg/dL — ABNORMAL HIGH (ref 70–99)

## 2021-12-09 SURGERY — COLONOSCOPY WITH PROPOFOL
Anesthesia: General

## 2021-12-09 MED ORDER — PROPOFOL 500 MG/50ML IV EMUL
INTRAVENOUS | Status: DC | PRN
  Administered 2021-12-09: 150 ug/kg/min via INTRAVENOUS

## 2021-12-09 MED ORDER — LIDOCAINE HCL (CARDIAC) PF 100 MG/5ML IV SOSY
PREFILLED_SYRINGE | INTRAVENOUS | Status: DC | PRN
  Administered 2021-12-09: 50 mg via INTRAVENOUS

## 2021-12-09 MED ORDER — PROPOFOL 10 MG/ML IV BOLUS
INTRAVENOUS | Status: DC | PRN
  Administered 2021-12-09: 100 mg via INTRAVENOUS
  Administered 2021-12-09 (×2): 50 mg via INTRAVENOUS

## 2021-12-09 MED ORDER — LACTATED RINGERS IV SOLN
INTRAVENOUS | Status: DC
Start: 2021-12-09 — End: 2021-12-09

## 2021-12-09 NOTE — Anesthesia Postprocedure Evaluation (Signed)
Anesthesia Post Note  Patient: Natalie Anderson  Procedure(s) Performed: COLONOSCOPY WITH PROPOFOL ESOPHAGOGASTRODUODENOSCOPY (EGD) WITH PROPOFOL [place] PLACEMENT  Patient location during evaluation: Phase II Anesthesia Type: General Level of consciousness: awake and alert and oriented Pain management: pain level controlled Vital Signs Assessment: post-procedure vital signs reviewed and stable Respiratory status: spontaneous breathing, nonlabored ventilation and respiratory function stable Cardiovascular status: blood pressure returned to baseline and stable Postop Assessment: no apparent nausea or vomiting Anesthetic complications: no   No notable events documented.   Last Vitals:  Vitals:   12/09/21 0721 12/09/21 0856  BP: 132/78 (!) 105/56  Pulse: 88 74  Resp: 16 14  Temp: 36.7 C 36.7 C  SpO2: 98% 97%    Last Pain:  Vitals:   12/09/21 0856  TempSrc: Oral  PainSc: 0-No pain                 Jamesen Stahnke C Rekita Miotke

## 2021-12-09 NOTE — Op Note (Signed)
[facility] Patient Name: Natalie Anderson Procedure Date: 12/09/2021 7:49 AM MRN: [id number] Date of Birth: 1946/07/12 Attending MD: Norvel Richards , MD CSN: [id number] Age: 76 Admit Type: Outpatient Procedure:                Upper GI endoscopy Indications:              Dysphagia Providers:                Norvel Richards, MD, Lambert Mody,                            Kristine L. Risa Grill, Technician Referring MD:              Medicines:                Propofol per Anesthesia Complications:            No immediate complications. Estimated Blood Loss:     Estimated blood loss was minimal. Procedure:                Pre-Anesthesia Assessment:                           - Prior to the procedure, a History and Physical                            was performed, and patient medications and                            allergies were reviewed. The patient's tolerance of                            previous anesthesia was also reviewed. The risks                            and benefits of the procedure and the sedation                            options and risks were discussed with the patient.                            All questions were answered, and informed consent                            was obtained. Prior Anticoagulants: The patient has                            taken no previous anticoagulant or antiplatelet                            agents. ASA Grade Assessment: III - A patient with                            severe systemic disease. After reviewing the risks  and benefits, the patient was deemed in                            satisfactory condition to undergo the procedure.                           After obtaining informed consent, the endoscope was                            passed under direct vision. Throughout the                            procedure, the patient's blood pressure, pulse, and                            oxygen  saturations were monitored continuously. The                            GIF-H190 ([id number]) scope was introduced through the                            mouth, and advanced to the second part of duodenum.                            The upper GI endoscopy was accomplished without                            difficulty. The patient tolerated the procedure                            well. Scope In: 8:12:03 AM Scope Out: 8:17:40 AM Total Procedure Duration: 0 hours 5 minutes 37 seconds  Findings:      A moderate Schatzki ring was found at the gastroesophageal junction.       Couple of overlying erosions. No mass. No Barrett's epithelium seen. The       scope was withdrawn. Dilation was performed with a Maloney dilator with       mild resistance at 56 Fr. The scope was withdrawn. Dilation was       performed with a Maloney dilator with mild resistance at 56 Fr. The       dilation site was examined following endoscope reinsertion and showed       moderate mucosal disruption. Estimated blood loss was minimal.      A small hiatal hernia was present. Otherwise, normal stomach.      The duodenal bulb and second portion of the duodenum were normal. Impression:               - Moderate Schatzki ring. Dilated.                           - Small hiatal hernia.                           - Normal duodenal bulb and second portion of the  duodenum.                           - No specimens collected. Moderate Sedation:      Moderate (conscious) sedation was personally administered by an       anesthesia professional. The following parameters were monitored: oxygen       saturation, heart rate, blood pressure, respiratory rate, EKG, adequacy       of pulmonary ventilation, and response to care. Recommendation:           - Return to my office in 3 months. Begin Protonix                            40 mg daily. See colonoscopy report. Procedure Code(s):        --- Professional ---                            [phone], Esophagogastroduodenoscopy, flexible,                            transoral; diagnostic, including collection of                            specimen(s) by brushing or washing, when performed                            (separate procedure)                           43450, Dilation of esophagus, by unguided sound or                            bougie, single or multiple passes Diagnosis Code(s):        --- Professional ---                           K22.2, Esophageal obstruction                           K44.9, Diaphragmatic hernia without obstruction or                            gangrene                           R13.10, Dysphagia, unspecified CPT copyright 2019 American Medical Association. All rights reserved. The codes documented in this report are preliminary and upon coder review may  be revised to meet current compliance requirements. Cristopher Estimable. Kasumi Ditullio, MD Norvel Richards, MD 12/09/2021 8:59:09 AM This report has been signed electronically. Number of Addenda: 0

## 2021-12-09 NOTE — Transfer of Care (Signed)
Immediate Anesthesia Transfer of Care Note  Patient: Natalie Anderson  Procedure(s) Performed: COLONOSCOPY WITH PROPOFOL ESOPHAGOGASTRODUODENOSCOPY (EGD) WITH PROPOFOL MALONEY DILATION POLYPECTOMY  Patient Location: Short Stay  Anesthesia Type:General  Level of Consciousness: awake  Airway & Oxygen Therapy: Patient Spontanous Breathing  Post-op Assessment: Report given to RN and Post -op Vital signs reviewed and stable  Post vital signs: Reviewed and stable  Last Vitals:  Vitals Value Taken Time  BP    Temp    Pulse    Resp    SpO2      Last Pain:  Vitals:   12/09/21 0819  TempSrc:   PainSc: 10-Worst pain ever         Complications: No notable events documented.

## 2021-12-09 NOTE — Discharge Instructions (Signed)
Colonoscopy Discharge Instructions  Read the instructions outlined below and refer to this sheet in the next few weeks. These discharge instructions provide you with general information on caring for yourself after you leave the hospital. Your doctor may also give you specific instructions. While your treatment has been planned according to the most current medical practices available, unavoidable complications occasionally occur. If you have any problems or questions after discharge, call Dr. Gala Romney at [phone]. ACTIVITY You may resume your regular activity, but move at a slower pace for the next 24 hours.  Take frequent rest periods for the next 24 hours.  Walking will help get rid of the air and reduce the bloated feeling in your belly (abdomen).  No driving for 24 hours (because of the medicine (anesthesia) used during the test).   Do not sign any important legal documents or operate any machinery for 24 hours (because of the anesthesia used during the test).  NUTRITION Drink plenty of fluids.  You may resume your normal diet as instructed by your doctor.  Begin with a light meal and progress to your normal diet. Heavy or fried foods are harder to digest and may make you feel sick to your stomach (nauseated).  Avoid alcoholic beverages for 24 hours or as instructed.  MEDICATIONS You may resume your normal medications unless your doctor tells you otherwise.  WHAT YOU CAN EXPECT TODAY Some feelings of bloating in the abdomen.  Passage of more gas than usual.  Spotting of blood in your stool or on the toilet paper.  IF YOU HAD POLYPS REMOVED DURING THE COLONOSCOPY: No aspirin products for 7 days or as instructed.  No alcohol for 7 days or as instructed.  Eat a soft diet for the next 24 hours.  FINDING OUT THE RESULTS OF YOUR TEST Not all test results are available during your visit. If your test results are not back during the visit, make an appointment with your caregiver to find out the  results. Do not assume everything is normal if you have not heard from your caregiver or the medical facility. It is important for you to follow up on all of your test results.  SEEK IMMEDIATE MEDICAL ATTENTION IF: You have more than a spotting of blood in your stool.  Your belly is swollen (abdominal distention).  You are nauseated or vomiting.  You have a temperature over 101.  You have abdominal pain or discomfort that is severe or gets worse throughout the day.   EGD Discharge instructions Please read the instructions outlined below and refer to this sheet in the next few weeks. These discharge instructions provide you with general information on caring for yourself after you leave the hospital. Your doctor may also give you specific instructions. While your treatment has been planned according to the most current medical practices available, unavoidable complications occasionally occur. If you have any problems or questions after discharge, please call your doctor. ACTIVITY You may resume your regular activity but move at a slower pace for the next 24 hours.  Take frequent rest periods for the next 24 hours.  Walking will help expel (get rid of) the air and reduce the bloated feeling in your abdomen.  No driving for 24 hours (because of the anesthesia (medicine) used during the test).  You may shower.  Do not sign any important legal documents or operate any machinery for 24 hours (because of the anesthesia used during the test).  NUTRITION Drink plenty of fluids.  You may  resume your normal diet.  Begin with a light meal and progress to your normal diet.  Avoid alcoholic beverages for 24 hours or as instructed by your caregiver.  MEDICATIONS You may resume your normal medications unless your caregiver tells you otherwise.  WHAT YOU CAN EXPECT TODAY You may experience abdominal discomfort such as a feeling of fullness or "gas" pains.  FOLLOW-UP Your doctor will discuss the results of  your test with you.  SEEK IMMEDIATE MEDICAL ATTENTION IF ANY OF THE FOLLOWING OCCUR: Excessive nausea (feeling sick to your stomach) and/or vomiting.  Severe abdominal pain and distention (swelling).  Trouble swallowing.  Temperature over 101 F (37.8 C).  Rectal bleeding or vomiting of blood.     GERD and colon polyp information provided  No MRI in the future until clips gone  Multiple polyps removed from your colon.  Your esophagus was stretched today  Begin Protonix 40 mg daily new prescription provided.  Take 30 minutes before breakfast every day  Office visit with [place] in 3 months  Further recommendations to follow pending review of pathology report  At patient request, I called Joya Martyr at [phone] -reviewed findings and recommendations.

## 2021-12-09 NOTE — Op Note (Signed)
[facility] Patient Name: Natalie Anderson Procedure Date: 12/09/2021 8:20 AM MRN: [id number] Date of Birth: July 12, 1946 Attending MD: Norvel Richards , MD CSN: [id number] Age: 76 Admit Type: Outpatient Procedure:                Colonoscopy Indications:              Positive fecal immunochemical test Providers:                Norvel Richards, MD, Lambert Mody,                            Kristine L. Risa Grill, Technician Referring MD:              Medicines:                Propofol per Anesthesia Complications:            No immediate complications. Estimated Blood Loss:     Estimated blood loss was minimal. Estimated blood                            loss was minimal. Procedure:                Pre-Anesthesia Assessment:                           - Prior to the procedure, a History and Physical                            was performed, and patient medications and                            allergies were reviewed. The patient's tolerance of                            previous anesthesia was also reviewed. The risks                            and benefits of the procedure and the sedation                            options and risks were discussed with the patient.                            All questions were answered, and informed consent                            was obtained. Prior Anticoagulants: The patient has                            taken no previous anticoagulant or antiplatelet                            agents. ASA Grade Assessment: III - A patient with  severe systemic disease. After reviewing the risks                            and benefits, the patient was deemed in                            satisfactory condition to undergo the procedure.                           After obtaining informed consent, the colonoscope                            was passed under direct vision. Throughout the                            procedure,  the patient's blood pressure, pulse, and                            oxygen saturations were monitored continuously. The                            [id number]) scope was introduced through the                            anus and advanced to the the cecum, identified by                            appendiceal orifice and ileocecal valve. The                            colonoscopy was performed without difficulty. The                            patient tolerated the procedure well. The quality                            of the bowel preparation was adequate. Scope In: 8:24:08 AM Scope Out: 8:50:57 AM Scope Withdrawal Time: 0 hours 21 minutes 57 seconds  Total Procedure Duration: 0 hours 26 minutes 49 seconds  Findings:      The perianal and digital rectal examinations were normal.      Six semi-pedunculated polyps were found in the recto-sigmoid colon,       sigmoid colon, hepatic flexure and cecum. The polyps were 3 to 9 mm in       size. These polyps were removed with a cold snare. Resection and       retrieval were complete. Estimated blood loss was minimal. Site of       largest 9 mm polyp (sigmoid). Used after resection. It appeared that       cautery was not working properly for this resection. It was essentially       cold snared; hemostasis clip x1 at the base to ensure hemostasis.      The exam was otherwise without abnormality on direct and retroflexion       views. Impression:               -  Six 3 to 9 mm polyps at the recto-sigmoid colon,                            in the sigmoid colon, at the hepatic flexure and in                            the cecum, removed with a cold snare. Resected and                            retrieved.                           - The examination was otherwise normal on direct                            and retroflexion views. Clip placed as described. Moderate Sedation:      Moderate (conscious) sedation was personally administered by an        anesthesia professional. The following parameters were monitored: oxygen       saturation, heart rate, blood pressure, respiratory rate, EKG, adequacy       of pulmonary ventilation, and response to care. Recommendation:           - Patient has a contact number available for                            emergencies. The signs and symptoms of potential                            delayed complications were discussed with the                            patient. Return to normal activities tomorrow.                            Written discharge instructions were provided to the                            patient.                           - Advance diet as tolerated.                           - Repeat colonoscopy date to be determined after                            pending pathology results are reviewed for                            surveillance.                           - Return to GI office in 3 months. See EGD report.  No future MRI until clip gone. Procedure Code(s):        --- Professional ---                           [phone], Colonoscopy, flexible; with removal of                            tumor(s), polyp(s), or other lesion(s) by snare                            technique Diagnosis Code(s):        --- Professional ---                           K63.5, Polyp of colon                           R19.5, Other fecal abnormalities CPT copyright 2019 [employer]. All rights reserved. The codes documented in this report are preliminary and upon coder review may  be revised to meet current compliance requirements. Cristopher Estimable. Eyoel Throgmorton, MD Norvel Richards, MD 12/09/2021 9:08:19 AM This report has been signed electronically. Number of Addenda: 0

## 2021-12-09 NOTE — Anesthesia Procedure Notes (Signed)
Date/Time: 12/09/2021 8:18 AM Performed by: Orlie Dakin, CRNA Pre-anesthesia Checklist: Patient identified, Emergency Drugs available, Suction available and Patient being monitored Patient Re-evaluated:Patient Re-evaluated prior to induction Oxygen Delivery Method: Nasal cannula Induction Type: IV induction Placement Confirmation: positive ETCO2

## 2021-12-09 NOTE — Anesthesia Preprocedure Evaluation (Signed)
Anesthesia Evaluation  Patient identified by MRN, date of birth, ID band Patient awake    Reviewed: Allergy & Precautions, NPO status , Patient's Chart, lab work & pertinent test results  History of Anesthesia Complications (+) PONV and history of anesthetic complications  Airway Mallampati: II  TM Distance: >3 FB Neck ROM: Full    Dental  (+) Missing, Loose,    Pulmonary shortness of breath and with exertion, asthma , pneumonia, COPD,  COPD inhaler,    Pulmonary exam normal breath sounds clear to auscultation       Cardiovascular Exercise Tolerance: Good hypertension, Pt. on medications Normal cardiovascular exam Rhythm:Regular Rate:Normal     Neuro/Psych  Headaches, negative psych ROS   GI/Hepatic Neg liver ROS, GERD  Medicated and Poorly Controlled,  Endo/Other  diabetes, Well Controlled, Type 2, Oral Hypoglycemic Agents  Renal/GU negative Renal ROS  negative genitourinary   Musculoskeletal  (+) Arthritis , Osteoarthritis,    Abdominal   Peds negative pediatric ROS (+)  Hematology negative hematology ROS (+)   Anesthesia Other Findings   Reproductive/Obstetrics negative OB ROS                           Anesthesia Physical Anesthesia Plan  ASA: 3  Anesthesia Plan: General   Post-op Pain Management: Minimal or no pain anticipated   Induction: Intravenous  PONV Risk Score and Plan: Propofol infusion  Airway Management Planned: Nasal Cannula and Natural Airway  Additional Equipment:   Intra-op Plan:   Post-operative Plan:   Informed Consent: I have reviewed the patients History and Physical, chart, labs and discussed the procedure including the risks, benefits and alternatives for the proposed anesthesia with the patient or authorized representative who has indicated his/her understanding and acceptance.     Dental advisory given  Plan Discussed with: CRNA and  Surgeon  Anesthesia Plan Comments:         Anesthesia Quick Evaluation

## 2021-12-09 NOTE — H&P (Signed)
$'@LOGO't$ @   Primary Care Physician:  Monico Blitz, MD Primary Gastroenterologist:  Dr.   Pre-Procedure History & Physical: HPI:  Natalie Anderson is a 76 y.o. female here for further evaluation of esophageal dysphagia and occult blood in stool. Striking positive family history of colon cancer with no prior colonoscopy.  Patient reports she historically has declined every time recommended.  Past Medical History:  Diagnosis Date   Arthritis    Asthma    as a child   Blood transfusion 07/25/1977   Cataract immature    bilateral   Chronic back pain    spondylosis and stenosis   Complication of anesthesia    stopped breathing after anesthesia   COPD (chronic obstructive pulmonary disease) (HCC)    Diabetes mellitus    takes Metformin bid and Levimir bid and Victoza daily   Dizziness    occasionally   Dry skin    and itchy    Fibroid tumor    GERD (gastroesophageal reflux disease)    takes Protonix daily   History of shingles 15-55yr ago   Hyperlipidemia    takes Lipitor occasionally   Hypertension    takes Losartan daily   Impaired hearing    Joint pain    Joint swelling    Migraine    last one 3 wks ago   Nocturia    Osteoporosis    Osteoporosis    Pneumonia    hx of-double as a child   PONV (postoperative nausea and vomiting)    Seasonal allergies    takes Claritin daily   Shortness of breath    with exertion   Urinary frequency    UTI (lower urinary tract infection)    hx of   Vertigo    hx of   Vitamin D deficiency    takes Vit D 50,000units on Sat    Past Surgical History:  Procedure Laterality Date   BACK SURGERY  12/24/2010   x2   CATARACT EXTRACTION W/PHACO Right 04/05/2021   Procedure: CATARACT EXTRACTION PHACO AND INTRAOCULAR LENS PLACEMENT ([facility] [place];  Surgeon: WBaruch Goldmann MD;  Location: [facility];  Service: Ophthalmology;  Laterality: Right;  CDE 5.98   CATARACT EXTRACTION W/PHACO Left 04/19/2021   Procedure: CATARACT EXTRACTION PHACO AND INTRAOCULAR  LENS PLACEMENT LEFT EYE;  Surgeon: WBaruch Goldmann MD;  Location: [facility];  Service: Ophthalmology;  Laterality: Left;  left CDE=5.90   DILATION AND CURETTAGE OF UTERUS  07/25/1977    Prior to Admission medications   Medication Sig Start Date End Date Taking? Authorizing Provider  acetaminophen (TYLENOL) 500 MG tablet Take 500 mg by mouth every 6 (six) hours as needed (for pain.).   Yes [provider]  albuterol (PROVENTIL HFA;VENTOLIN HFA) 108 (90 BASE) MCG/ACT inhaler Inhale 2 puffs into the lungs every 6 (six) hours as needed for wheezing.   Yes [provider]  alendronate (FOSAMAX) 70 MG tablet Take 70 mg by mouth every Monday. Take with a full glass of water on an empty stomach.   Yes [provider]  amLODipine (NORVASC) 5 MG tablet Take 5 mg by mouth every evening.   Yes [provider]  budesonide-formoterol (SYMBICORT) 160-4.5 MCG/ACT inhaler Inhale 2 puffs into the lungs in the morning.   Yes [provider]  Cholecalciferol (VITAMIN D-3) 125 MCG (5000 UT) TABS Take 5,000 Units by mouth every evening.   Yes [provider]  famotidine (PEPCID) 20 MG tablet Take 20 mg by mouth 2 (two) times  daily as needed for heartburn or indigestion.   Yes [provider]  gabapentin (NEURONTIN) 300 MG capsule Take 600 mg by mouth 3 (three) times daily.   Yes [provider]  loratadine (CLARITIN) 10 MG tablet Take 10 mg by mouth daily.   Yes [provider]  losartan (COZAAR) 100 MG tablet Take 100 mg by mouth in the morning.   Yes [provider]  metFORMIN (GLUCOPHAGE) 500 MG tablet Take 1,000 mg by mouth 2 (two) times daily with a meal.   Yes [provider]  mometasone (NASONEX) 50 MCG/ACT nasal spray Place 2 sprays into the nose in the morning.   Yes [provider]  naproxen sodium (ALEVE) 220 MG tablet Take 220 mg by mouth 2 (two) times daily as needed (pain.).   Yes [provider]  polyethylene glycol-electrolytes (TRILYTE) 420 g solution Take 4,000 mLs by mouth as directed. 10/27/21  Yes Johannah Rozas, Cristopher Estimable, MD  rosuvastatin (CRESTOR) 5 MG tablet Take 5 mg by mouth every Monday.   Yes [provider]  sitaGLIPtin (JANUVIA) 100 MG tablet Take 100 mg by mouth in the morning.   Yes [provider]  TRESIBA FLEXTOUCH 200 UNIT/ML FlexTouch Pen Inject 40 Units into the skin in the morning. 03/11/21  Yes [provider]  aspirin EC 81 MG tablet Take 81 mg by mouth in the morning. Swallow whole.    [provider]    Allergies as of 10/27/2021 - Review Complete 10/27/2021  Allergen Reaction Noted   Latex Rash 10/04/2011   Morphine and related Itching and Nausea And Vomiting 11/02/2011   Other Hives and Rash 10/04/2011   Prednisone  04/01/2021   Nickel Rash 10/28/2011    Family History  Problem Relation Age of Onset   Lung cancer Mother    Colon polyps Sister    Lung cancer Brother    Colon cancer Paternal Grandmother        75   Colon cancer Cousin 19   Colon cancer Cousin 19   Colon cancer Cousin 21   Colon cancer Nephew 31   Colon cancer Niece 17   Anesthesia problems Neg Hx    Hypotension Neg Hx    Malignant hyperthermia Neg Hx    Pseudochol deficiency Neg Hx     Social History   Socioeconomic History   Marital status: Widowed    Spouse name: Not on file   Number of children: Not on file   Years of education: Not on file   Highest education level: Not on file  Occupational History   Not on file  Tobacco Use   Smoking status: Never   Smokeless tobacco: Never  Vaping Use   Vaping Use: Never used  Substance and Sexual Activity   Alcohol use: No   Drug use: No   Sexual activity: Never  Other Topics Concern   Not on file  Social History Narrative   Not on file   Social Determinants of Health   Financial Resource Strain: Not on file  Food Insecurity: Not on file  Transportation Needs: Not on file  Physical  Activity: Not on file  Stress: Not on file  Social Connections: Not on file  Intimate Partner Violence: Not on file    Review of Systems: See HPI, otherwise negative ROS  Physical Exam: BP 132/78   Pulse 88   Temp 98 F (36.7 C) (Oral)   Resp 16   SpO2 98%  General:  Alert,  Well-developed, well-nourished, pleasant and cooperative in NAD Sathy. Lungs:  Clear throughout to auscultation.   No wheezes, crackles, or rhonchi. No acute distress. Heart:  Regular rate and rhythm; no murmurs, clicks, rubs,  or gallops. Abdomen: Non-distended, normal bowel sounds.  Soft and nontender without appreciable mass or hepatosplenomegaly.  Pulses:  Normal pulses noted. Extremities:  Without clubbing or edema.  Impression/Plan: 76 year old female with a occult blood in stool.  Striking positive family history colon cancer.  Esophageal dysphagia.  Agree with need for EGD with possible esophageal dilation and colonoscopy today per plan.  The risks, benefits, limitations, imponderables and alternatives regarding both EGD and colonoscopy have been reviewed with the patient. Questions have been answered. All parties agreeable.       Notice: This dictation was prepared with Dragon dictation along with smaller phrase technology. Any transcriptional errors that result from this process are unintentional and may not be corrected upon review.

## 2021-12-10 LAB — SURGICAL PATHOLOGY

## 2021-12-14 ENCOUNTER — Encounter: Payer: Self-pay | Admitting: Internal Medicine

## 2021-12-15 ENCOUNTER — Encounter (HOSPITAL_COMMUNITY): Payer: Self-pay | Admitting: Internal Medicine

## 2021-12-21 DIAGNOSIS — E1165 Type 2 diabetes mellitus with hyperglycemia: Secondary | ICD-10-CM | POA: Diagnosis not present

## 2021-12-22 DIAGNOSIS — Z299 Encounter for prophylactic measures, unspecified: Secondary | ICD-10-CM | POA: Diagnosis not present

## 2021-12-22 DIAGNOSIS — I10 Essential (primary) hypertension: Secondary | ICD-10-CM | POA: Diagnosis not present

## 2021-12-22 DIAGNOSIS — E1165 Type 2 diabetes mellitus with hyperglycemia: Secondary | ICD-10-CM | POA: Diagnosis not present

## 2021-12-22 DIAGNOSIS — Z6826 Body mass index (BMI) 26.0-26.9, adult: Secondary | ICD-10-CM | POA: Diagnosis not present

## 2021-12-22 DIAGNOSIS — K209 Esophagitis, unspecified without bleeding: Secondary | ICD-10-CM | POA: Diagnosis not present

## 2021-12-22 DIAGNOSIS — G8194 Hemiplegia, unspecified affecting left nondominant side: Secondary | ICD-10-CM | POA: Diagnosis not present

## 2021-12-22 DIAGNOSIS — Z0000 Encounter for general adult medical examination without abnormal findings: Secondary | ICD-10-CM | POA: Diagnosis not present

## 2021-12-22 DIAGNOSIS — E7800 Pure hypercholesterolemia, unspecified: Secondary | ICD-10-CM | POA: Diagnosis not present

## 2022-01-12 DIAGNOSIS — Z299 Encounter for prophylactic measures, unspecified: Secondary | ICD-10-CM | POA: Diagnosis not present

## 2022-01-12 DIAGNOSIS — E1165 Type 2 diabetes mellitus with hyperglycemia: Secondary | ICD-10-CM | POA: Diagnosis not present

## 2022-01-12 DIAGNOSIS — Z789 Other specified health status: Secondary | ICD-10-CM | POA: Diagnosis not present

## 2022-01-12 DIAGNOSIS — I10 Essential (primary) hypertension: Secondary | ICD-10-CM | POA: Diagnosis not present

## 2022-01-12 DIAGNOSIS — M25512 Pain in left shoulder: Secondary | ICD-10-CM | POA: Diagnosis not present

## 2022-01-12 DIAGNOSIS — M19012 Primary osteoarthritis, left shoulder: Secondary | ICD-10-CM | POA: Diagnosis not present

## 2022-01-20 DIAGNOSIS — E1165 Type 2 diabetes mellitus with hyperglycemia: Secondary | ICD-10-CM | POA: Diagnosis not present

## 2022-02-21 DIAGNOSIS — E1165 Type 2 diabetes mellitus with hyperglycemia: Secondary | ICD-10-CM | POA: Diagnosis not present

## 2022-03-05 NOTE — Progress Notes (Unsigned)
Referring Provider: Monico Blitz, MD Primary Care Physician:  Monico Blitz, MD Primary GI Physician: Dr. Gala Romney  Chief Complaint  Patient presents with   Follow-up    Follow up from colonoscopy. Was prescribed pantoprazole and states that it wasn't working so she went back to pepcid at night, but that isn't working either.     HPI:   Natalie Anderson is a 76 y.o. female presenting today for follow-up s/p EGD and colonoscopy which was pursued for positive fit test and dysphagia.  EGD/colonoscopy  12/09/21: EGD: Moderate Schatzki ring. Dilated. Small hiatal hernia. Recommended starting Protonix 40 mg daily.   Colonoscopy: Six 3 to 9 mm polyps removed and 1 hemostasis clip placed.  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  Recommended repeat in 3 years.   Today: Dysphagia resolved.   Reflux nightly. No trouble during the day. Usually when laying down for bed or waking her up from sleep.  Took pantoprazole 40 mg daily (in the morning) and this didn't help. Also tried taking Protonix at night time just before going to bed, but this also didn't help. Resumed pepcid 20 mg at night, but continues with symptoms.   Up for at least 3 hours after eating before going to bed. Drinking 1 soda or less a day. Few fried foods. Rare spicy foods.   No abdominal pain, nausea, or vomiting. No weight loss.   Bowels moving well. No brbpr or melena.   Reports she may have an MRI in the near future for her left arm. It is causing her a lot of pain. Had an x ray that was unrevealing. Reports muscle waisting in this arm as well.   Past Medical History:  Diagnosis Date   Arthritis    Asthma    as a child   Blood transfusion 07/25/1977   Cataract immature    bilateral   Chronic back pain    spondylosis and stenosis   Complication of anesthesia    stopped breathing after anesthesia   COPD (chronic obstructive pulmonary disease) (HCC)    Diabetes mellitus    takes  Metformin bid and Levimir bid and Victoza daily   Dizziness    occasionally   Dry skin    and itchy    Fibroid tumor    GERD (gastroesophageal reflux disease)    takes Protonix daily   History of shingles 15-50yr ago   Hyperlipidemia    takes Lipitor occasionally   Hypertension    takes Losartan daily   Impaired hearing    Joint pain    Joint swelling    Migraine    last one 3 wks ago   Nocturia    Osteoporosis    Osteoporosis    Pneumonia    hx of-double as a child   PONV (postoperative nausea and vomiting)    Seasonal allergies    takes Claritin daily   Shortness of breath    with exertion   Urinary frequency    UTI (lower urinary tract infection)    hx of   Vertigo    hx of   Vitamin D deficiency    takes Vit D 50,000units on Sat    Past Surgical History:  Procedure Laterality Date   BACK SURGERY  12/24/2010   x2   CATARACT EXTRACTION W/PHACO Right 04/05/2021   Procedure: CATARACT EXTRACTION PHACO AND INTRAOCULAR LENS PLAC[place]  Surgeon: WBaruch Goldmann MD;  Location: [facility];  Service: Ophthalmology;  Laterality: Right;  CDE 5.98   CATARACT EXTRACTION W/PHACO Left 04/19/2021   Procedure: CATARACT EXTRACTION PHACO AND INTRAOCULAR LENS PLACEMENT LEFT EYE;  Surgeon: [name], MD;  Location: [facility];  Service: Ophthalmology;  Laterality: Left;  left CDE=5.90   COLONOSCOPY WITH PROPOFOL N/A 12/09/2021   Surgeon: Daneil Dolin, MD; Six 3 to 9 mm polyps removed and 1 hemostasis clip placed.  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  Recommended repeat in 3 years.   DILATION AND CURETTAGE OF UTERUS  07/25/1977   ESOPHAGOGASTRODUODENOSCOPY (EGD) WITH PROPOFOL N/A 12/09/2021   Surgeon: Daneil Dolin, MD; Moderate Schatzki ring. Dilated. Small hiatal hernia.   HEMOSTASIS CLIP PLACEMENT  12/09/2021   Procedure: HEMOSTASIS CLIP PLACEMENT;  Surgeon: Daneil Dolin, MD;  [facility];  Service: Endoscopy;;   MALONEY  DILATION N/A 12/09/2021   Procedure: Keturah Shavers;  Surgeon: Daneil Dolin, MD;  [facility];  Service: Endoscopy;  Laterality: N/A;   POLYPECTOMY  12/09/2021   Procedure: POLYPECTOMY;  Surgeon: Daneil Dolin, MD;  [facility];  Service: Endoscopy;;    Current Outpatient Medications  Medication Sig Dispense Refill   acetaminophen (TYLENOL) 500 MG tablet Take 500 mg by mouth every 6 (six) hours as needed (for pain.).     albuterol (PROVENTIL HFA;VENTOLIN HFA) 108 (90 BASE) MCG/ACT inhaler Inhale 2 puffs into the lungs every 6 (six) hours as needed for wheezing.     alendronate (FOSAMAX) 70 MG tablet Take 70 mg by mouth every Monday. Take with a full glass of water on an empty stomach.     amLODipine (NORVASC) 5 MG tablet Take 5 mg by mouth every evening.     aspirin EC 81 MG tablet Take 81 mg by mouth in the morning. Swallow whole.     budesonide-formoterol (SYMBICORT) 160-4.5 MCG/ACT inhaler Inhale 2 puffs into the lungs in the morning.     Cholecalciferol (VITAMIN D-3) 125 MCG (5000 UT) TABS Take 5,000 Units by mouth every evening.     gabapentin (NEURONTIN) 300 MG capsule Take 600 mg by mouth 3 (three) times daily.     loratadine (CLARITIN) 10 MG tablet Take 10 mg by mouth daily.     losartan (COZAAR) 100 MG tablet Take 100 mg by mouth in the morning.     metFORMIN (GLUCOPHAGE) 500 MG tablet Take 1,000 mg by mouth 2 (two) times daily with a meal.     mometasone (NASONEX) 50 MCG/ACT nasal spray Place 2 sprays into the nose in the morning.     naproxen sodium (ALEVE) 220 MG tablet Take 220 mg by mouth 2 (two) times daily as needed (pain.).     omeprazole (PRILOSEC) 40 MG capsule Take 1 capsule (40 mg total) by mouth daily before supper. 30 capsule 3   rosuvastatin (CRESTOR) 5 MG tablet Take 5 mg by mouth every Monday.     sitaGLIPtin (JANUVIA) 100 MG tablet Take 100 mg by mouth in the morning.     TRESIBA FLEXTOUCH 200 UNIT/ML FlexTouch Pen Inject 40 Units into  the skin in the morning.     No current facility-administered medications for this visit.    Allergies as of 03/07/2022 - Review Complete 03/07/2022  Allergen Reaction Noted   Latex Rash 10/04/2011   Morphine and related Itching and Nausea And Vomiting 11/02/2011   Other Hives and Rash 10/04/2011   Amoxicillin Other (See Comments) 12/06/2021   Prednisone  04/01/2021  Nickel Rash 10/28/2011    Family History  Problem Relation Age of Onset   Lung cancer Mother    Colon polyps Sister    Lung cancer Brother    Colon cancer Paternal Grandmother        66   Colon cancer Cousin 19   Colon cancer Cousin 19   Colon cancer Cousin 21   Colon cancer Nephew 30   Colon cancer Niece 53   Anesthesia problems Neg Hx    Hypotension Neg Hx    Malignant hyperthermia Neg Hx    Pseudochol deficiency Neg Hx     Social History   Socioeconomic History   Marital status: Widowed    Spouse name: Not on file   Number of children: Not on file   Years of education: Not on file   Highest education level: Not on file  Occupational History   Not on file  Tobacco Use   Smoking status: Never   Smokeless tobacco: Never  Vaping Use   Vaping Use: Never used  Substance and Sexual Activity   Alcohol use: No   Drug use: No   Sexual activity: Never  Other Topics Concern   Not on file  Social History Narrative   Not on file   Social Determinants of Health   Financial Resource Strain: Not on file  Food Insecurity: Not on file  Transportation Needs: Not on file  Physical Activity: Not on file  Stress: Not on file  Social Connections: Not on file    Review of Systems: Gen: Denies fever, chills, cold or flulike symptoms, presyncope, syncope. CV: Denies chest pain, palpitations. Resp: Denies dyspnea, cough.  GI: See HPI Heme: See HPI  Physical Exam: BP (!) 150/76 (BP Location: Left Arm, Patient Position: Sitting, Cuff Size: Normal)   Pulse 100   Temp 97.7 F (36.5 C) (Temporal)   Ht 5'  2.5" (1.588 m)   Wt 145 lb 9.6 oz (66 kg)   SpO2 97%   BMI 26.21 kg/m  General:   Alert and oriented. No distress noted. Pleasant and cooperative.  Head:  Normocephalic and atraumatic. Eyes:  Conjuctiva clear without scleral icterus. Heart:  S1, S2 present without murmurs appreciated. Lungs:  Clear to auscultation bilaterally. No wheezes, rales, or rhonchi. No distress.  Abdomen:  +BS, soft, non-tender and non-distended. No rebound or guarding. No HSM or masses noted. Msk:  Symmetrical without gross deformities. Normal posture. Extremities:  Without edema. Neurologic:  Alert and  oriented x4 Psych:  Normal mood and affect.    Assessment:  76 y.o. female presenting today for follow-up s/p EGD and colonoscopy which was pursued for positive fit test and dysphagia.  Procedures were completed 12/09/2021.  EGD revealed moderate Schatzki's ring that was dilated, small hiatal hernia.  Colonoscopy with 6 polyps removed with 1 hemostasis clip placed, pathology revealed tubular adenomas and sessile serrated adenoma.  Recommended repeat colonoscopy in 3 years and starting Protonix 40 mg daily.  Dysphagia: Resolved s/p dilation of Schatzki's ring.   GERD:  Chronic.  Not adequately controlled on Pepcid 20 mg nightly.  Reports having reflux symptoms almost every night.  Recently failed Protonix 40 mg daily.  She did try taking Protonix in the morning which was not helpful, then started taking Protonix before bed which was still unhelpful.  Also sleeping propped up and doesn't lay down within 3 hours of eating. We will try her on omeprazole 40 mg before dinner as her symptoms are primarily nocturnal. Reinforced  GERD diet/lifestyle.   History of adenomatous colon polyps: Due for surveillance colonoscopy in May 2026.  She is on recall.   Retained Foreign Body:  1 hemostasis clip placed at the time of colonoscopy in May 2023.  We will arrange abdominal x-ray to see if this has passed as patient isn't sure  if she may need an MRI in the near future for her arm.    Plan:  Stop Pepcid. Start omeprazole 40 mg daily 30 minutes before dinner. Counseled on GERD diet/lifestyle.  Separate written instructions provided. (See AVS). DG abdomen 2 view.  Next colonoscopy in May 2026.  Follow-up in 3 months or sooner if needed.   Aliene Altes, PA-C [facility] 03/07/2022

## 2022-03-07 ENCOUNTER — Ambulatory Visit: Payer: HMO | Admitting: Gastroenterology

## 2022-03-07 ENCOUNTER — Ambulatory Visit (HOSPITAL_COMMUNITY)
Admission: RE | Admit: 2022-03-07 | Discharge: 2022-03-07 | Disposition: A | Discharge status: Home / Self Care | Payer: HMO | Source: Ambulatory Visit | Attending: Gastroenterology | Admitting: Gastroenterology

## 2022-03-07 ENCOUNTER — Encounter: Payer: Self-pay | Admitting: Gastroenterology

## 2022-03-07 VITALS — BP 150/76 | HR 100 | Temp 97.7°F | Ht 62.5 in | Wt 145.6 lb

## 2022-03-07 DIAGNOSIS — R1310 Dysphagia, unspecified: Secondary | ICD-10-CM | POA: Diagnosis not present

## 2022-03-07 DIAGNOSIS — K219 Gastro-esophageal reflux disease without esophagitis: Secondary | ICD-10-CM | POA: Diagnosis not present

## 2022-03-07 DIAGNOSIS — Z189 Retained foreign body fragments, unspecified material: Secondary | ICD-10-CM | POA: Insufficient documentation

## 2022-03-07 DIAGNOSIS — R140 Abdominal distension (gaseous): Secondary | ICD-10-CM | POA: Diagnosis not present

## 2022-03-07 DIAGNOSIS — Z981 Arthrodesis status: Secondary | ICD-10-CM | POA: Diagnosis not present

## 2022-03-07 MED ORDER — OMEPRAZOLE 40 MG PO CPDR: 40.0000 mg | DELAYED_RELEASE_CAPSULE | Freq: Every day | ORAL | 3 refills | Status: DC

## 2022-03-07 NOTE — Patient Instructions (Signed)
Please have x-ray of your abdomen completed at [facility].  This is to make sure that the clip that was placed in your colon at the time of your colonoscopy is no longer there.   Stop Pepcid and start omeprazole 40 mg 30 minutes before dinner.  Follow a GERD diet:  Avoid fried, fatty, greasy, spicy, citrus foods. Avoid caffeine and carbonated beverages. Avoid chocolate. Try eating 4-6 small meals a day rather than 3 large meals. Do not eat within 3 hours of laying down. Prop head of bed up on wood or bricks to create a 6 inch incline.  We will plan to see back in about 3 months.  Do not hesitate to call if you have any questions or concerns prior to your next visit.  It was great to see you again today!  Aliene Altes, PA-C [facility]

## 2022-03-14 DIAGNOSIS — M79601 Pain in right arm: Secondary | ICD-10-CM | POA: Diagnosis not present

## 2022-03-14 DIAGNOSIS — E7800 Pure hypercholesterolemia, unspecified: Secondary | ICD-10-CM | POA: Diagnosis not present

## 2022-03-14 DIAGNOSIS — Z0000 Encounter for general adult medical examination without abnormal findings: Secondary | ICD-10-CM | POA: Diagnosis not present

## 2022-03-14 DIAGNOSIS — Z1331 Encounter for screening for depression: Secondary | ICD-10-CM | POA: Diagnosis not present

## 2022-03-14 DIAGNOSIS — Z79899 Other long term (current) drug therapy: Secondary | ICD-10-CM | POA: Diagnosis not present

## 2022-03-14 DIAGNOSIS — Z6826 Body mass index (BMI) 26.0-26.9, adult: Secondary | ICD-10-CM | POA: Diagnosis not present

## 2022-03-14 DIAGNOSIS — Z1339 Encounter for screening examination for other mental health and behavioral disorders: Secondary | ICD-10-CM | POA: Diagnosis not present

## 2022-03-14 DIAGNOSIS — I10 Essential (primary) hypertension: Secondary | ICD-10-CM | POA: Diagnosis not present

## 2022-03-14 DIAGNOSIS — R5383 Other fatigue: Secondary | ICD-10-CM | POA: Diagnosis not present

## 2022-03-14 DIAGNOSIS — E1165 Type 2 diabetes mellitus with hyperglycemia: Secondary | ICD-10-CM | POA: Diagnosis not present

## 2022-03-14 DIAGNOSIS — Z7189 Other specified counseling: Secondary | ICD-10-CM | POA: Diagnosis not present

## 2022-03-14 DIAGNOSIS — M542 Cervicalgia: Secondary | ICD-10-CM | POA: Diagnosis not present

## 2022-03-14 DIAGNOSIS — E559 Vitamin D deficiency, unspecified: Secondary | ICD-10-CM | POA: Diagnosis not present

## 2022-03-14 DIAGNOSIS — Z299 Encounter for prophylactic measures, unspecified: Secondary | ICD-10-CM | POA: Diagnosis not present

## 2022-03-29 DIAGNOSIS — E1165 Type 2 diabetes mellitus with hyperglycemia: Secondary | ICD-10-CM | POA: Diagnosis not present

## 2022-03-29 DIAGNOSIS — Z299 Encounter for prophylactic measures, unspecified: Secondary | ICD-10-CM | POA: Diagnosis not present

## 2022-03-29 DIAGNOSIS — I10 Essential (primary) hypertension: Secondary | ICD-10-CM | POA: Diagnosis not present

## 2022-03-29 DIAGNOSIS — M25512 Pain in left shoulder: Secondary | ICD-10-CM | POA: Diagnosis not present

## 2022-03-29 DIAGNOSIS — Z6826 Body mass index (BMI) 26.0-26.9, adult: Secondary | ICD-10-CM | POA: Diagnosis not present

## 2022-03-29 DIAGNOSIS — K209 Esophagitis, unspecified without bleeding: Secondary | ICD-10-CM | POA: Diagnosis not present

## 2022-06-06 NOTE — Progress Notes (Signed)
GI Office Note    Referring Provider: Monico Blitz, MD Primary Care Physician:  Monico Blitz, MD  Primary Gastroenterologist: Garfield Cornea, MD   Chief Complaint   Chief Complaint  Patient presents with   Follow-up    Doing well.     History of Present Illness   Natalie Anderson is a 76 y.o. female presenting today for follow up GERD. Last seen 02/2022.    Doing well. Patient taking omeprazole daily before supper. Reflux is well controlled. No dysphagia. No abd pain. BMs regular. No melena, brbpr.    EGD/colonoscopy  12/09/21: EGD: Moderate Schatzki ring. Dilated. Small hiatal hernia. Recommended starting Protonix 40 mg daily.    Colonoscopy: Six 3 to 9 mm polyps removed and 1 hemostasis clip placed.  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  Recommended repeat in 3 years.   Medications   Current Outpatient Medications  Medication Sig Dispense Refill   albuterol (PROVENTIL HFA;VENTOLIN HFA) 108 (90 BASE) MCG/ACT inhaler Inhale 2 puffs into the lungs every 6 (six) hours as needed for wheezing.     alendronate (FOSAMAX) 70 MG tablet Take 70 mg by mouth every Monday. Take with a full glass of water on an empty stomach.     amLODipine (NORVASC) 5 MG tablet Take 5 mg by mouth every evening.     aspirin EC 81 MG tablet Take 81 mg by mouth in the morning. Swallow whole.     budesonide-formoterol (SYMBICORT) 160-4.5 MCG/ACT inhaler Inhale 2 puffs into the lungs in the morning.     Cholecalciferol (VITAMIN D-3) 125 MCG (5000 UT) TABS Take 5,000 Units by mouth every evening.     gabapentin (NEURONTIN) 300 MG capsule Take 600 mg by mouth 3 (three) times daily.     HUMALOG KWIKPEN 100 UNIT/ML KwikPen Inject 10 Units into the skin daily.     loratadine (CLARITIN) 10 MG tablet Take 10 mg by mouth daily.     losartan (COZAAR) 100 MG tablet Take 100 mg by mouth in the morning.     metFORMIN (GLUCOPHAGE) 1000 MG tablet Take 1,000 mg by mouth 2 (two) times  daily with a meal.     omeprazole (PRILOSEC) 40 MG capsule Take 1 capsule (40 mg total) by mouth daily before supper. 30 capsule 3   rosuvastatin (CRESTOR) 5 MG tablet Take 5 mg by mouth every Monday.     sitaGLIPtin (JANUVIA) 100 MG tablet Take 100 mg by mouth in the morning.     TRESIBA FLEXTOUCH 200 UNIT/ML FlexTouch Pen Inject 40 Units into the skin in the morning.     No current facility-administered medications for this visit.    Allergies   Allergies as of 06/07/2022 - Review Complete 06/07/2022  Allergen Reaction Noted   Latex Rash 10/04/2011   Morphine and related Itching and Nausea And Vomiting 11/02/2011   Other Hives and Rash 10/04/2011   Amoxicillin Other (See Comments) 12/06/2021   Prednisone  04/01/2021   Nickel Rash 10/28/2011      Review of Systems   General: Negative for anorexia, weight loss, fever, chills, fatigue, weakness. ENT: Negative for hoarseness, difficulty swallowing , nasal congestion. CV: Negative for chest pain, angina, palpitations, dyspnea on exertion, peripheral edema.  Respiratory: Negative for dyspnea at rest, dyspnea on exertion, cough, sputum, wheezing.  GI: See history of present illness. GU:  Negative for dysuria, hematuria, urinary incontinence, urinary frequency, nocturnal urination.  Endo: Negative for unusual weight change.  Physical Exam   BP (!) 159/77 (BP Location: Right Arm, Patient Position: Sitting, Cuff Size: Normal)   Pulse 83   Temp 97.9 F (36.6 C) (Oral)   Ht '5\' 2"'$  (1.575 m)   Wt 146 lb 9.6 oz (66.5 kg)   SpO2 96%   BMI 26.81 kg/m    General: Well-nourished, well-developed in no acute distress.  Eyes: No icterus. Mouth: Oropharyngeal mucosa moist and pink Abdomen: Bowel sounds are normal, nontender, nondistended, no hepatosplenomegaly or masses, no abdominal bruits or hernia , no rebound or guarding  Rectal: not performed Extremities: No lower extremity edema. No clubbing or deformities. Neuro: Alert and  oriented x 4   Skin: Warm and dry, no jaundice.   Psych: Alert and cooperative, normal mood and affect.  Labs   02/2022: CBC, LFTs both normal, reviewed in Home portal  Imaging Studies   No results found.  Assessment   GERD: doing well at this time.   The patient was found to have elevated blood pressure when vital signs were checked in the office. The blood pressure was rechecked by the nursing staff and it was found be persistently elevated >140/90 mmHg. I personally advised to the patient to follow up closely with his PCP for hypertension control.   PLAN   Continue omeprazole '40mg'$  daily before supper.  Reinforced antireflux measures.  Return to the office in one year or call sooner if needed.     Laureen Ochs. Bobby Rumpf, [place], [facility]

## 2022-06-07 ENCOUNTER — Ambulatory Visit: Payer: HMO | Admitting: Gastroenterology

## 2022-06-07 ENCOUNTER — Encounter: Payer: Self-pay | Admitting: Gastroenterology

## 2022-06-07 VITALS — BP 160/70 | HR 83 | Temp 97.9°F | Ht 62.0 in | Wt 146.6 lb

## 2022-06-07 DIAGNOSIS — K219 Gastro-esophageal reflux disease without esophagitis: Secondary | ICD-10-CM

## 2022-06-07 NOTE — Patient Instructions (Signed)
Continue omeprazole '40mg'$  daily before supper.  We will see you in one year or call sooner if needed.

## 2022-06-07 NOTE — Progress Notes (Signed)
Taking omeprazole '40mg'$  daily before supper.  BM regular.  Ov in one year.

## 2022-09-22 DIAGNOSIS — E785 Hyperlipidemia, unspecified: Secondary | ICD-10-CM | POA: Diagnosis not present

## 2022-09-22 DIAGNOSIS — J45909 Unspecified asthma, uncomplicated: Secondary | ICD-10-CM | POA: Diagnosis not present

## 2022-09-22 DIAGNOSIS — I10 Essential (primary) hypertension: Secondary | ICD-10-CM | POA: Diagnosis not present

## 2022-10-10 DIAGNOSIS — Z299 Encounter for prophylactic measures, unspecified: Secondary | ICD-10-CM | POA: Diagnosis not present

## 2022-10-10 DIAGNOSIS — E1165 Type 2 diabetes mellitus with hyperglycemia: Secondary | ICD-10-CM | POA: Diagnosis not present

## 2022-10-10 DIAGNOSIS — I10 Essential (primary) hypertension: Secondary | ICD-10-CM | POA: Diagnosis not present

## 2022-10-10 DIAGNOSIS — J45901 Unspecified asthma with (acute) exacerbation: Secondary | ICD-10-CM | POA: Diagnosis not present

## 2022-10-10 DIAGNOSIS — Z6826 Body mass index (BMI) 26.0-26.9, adult: Secondary | ICD-10-CM | POA: Diagnosis not present

## 2022-10-10 DIAGNOSIS — Z0000 Encounter for general adult medical examination without abnormal findings: Secondary | ICD-10-CM | POA: Diagnosis not present

## 2022-10-23 DIAGNOSIS — E785 Hyperlipidemia, unspecified: Secondary | ICD-10-CM | POA: Diagnosis not present

## 2022-10-23 DIAGNOSIS — J45909 Unspecified asthma, uncomplicated: Secondary | ICD-10-CM | POA: Diagnosis not present

## 2022-10-23 DIAGNOSIS — I10 Essential (primary) hypertension: Secondary | ICD-10-CM | POA: Diagnosis not present

## 2022-11-14 DIAGNOSIS — Z299 Encounter for prophylactic measures, unspecified: Secondary | ICD-10-CM | POA: Diagnosis not present

## 2022-11-14 DIAGNOSIS — E1165 Type 2 diabetes mellitus with hyperglycemia: Secondary | ICD-10-CM | POA: Diagnosis not present

## 2022-11-14 DIAGNOSIS — I10 Essential (primary) hypertension: Secondary | ICD-10-CM | POA: Diagnosis not present

## 2023-01-16 DIAGNOSIS — R809 Proteinuria, unspecified: Secondary | ICD-10-CM | POA: Diagnosis not present

## 2023-01-16 DIAGNOSIS — E1129 Type 2 diabetes mellitus with other diabetic kidney complication: Secondary | ICD-10-CM | POA: Diagnosis not present

## 2023-01-16 DIAGNOSIS — I10 Essential (primary) hypertension: Secondary | ICD-10-CM | POA: Diagnosis not present

## 2023-01-16 DIAGNOSIS — K219 Gastro-esophageal reflux disease without esophagitis: Secondary | ICD-10-CM | POA: Diagnosis not present

## 2023-01-16 DIAGNOSIS — E1165 Type 2 diabetes mellitus with hyperglycemia: Secondary | ICD-10-CM | POA: Diagnosis not present

## 2023-01-16 DIAGNOSIS — Z299 Encounter for prophylactic measures, unspecified: Secondary | ICD-10-CM | POA: Diagnosis not present

## 2023-01-18 DIAGNOSIS — E1165 Type 2 diabetes mellitus with hyperglycemia: Secondary | ICD-10-CM | POA: Diagnosis not present

## 2023-02-13 DIAGNOSIS — I63039 Cerebral infarction due to thrombosis of unspecified carotid artery: Secondary | ICD-10-CM | POA: Diagnosis not present

## 2023-02-17 DIAGNOSIS — E1165 Type 2 diabetes mellitus with hyperglycemia: Secondary | ICD-10-CM | POA: Diagnosis not present

## 2023-03-16 DIAGNOSIS — Z1339 Encounter for screening examination for other mental health and behavioral disorders: Secondary | ICD-10-CM | POA: Diagnosis not present

## 2023-03-16 DIAGNOSIS — Z79899 Other long term (current) drug therapy: Secondary | ICD-10-CM | POA: Diagnosis not present

## 2023-03-16 DIAGNOSIS — Z7189 Other specified counseling: Secondary | ICD-10-CM | POA: Diagnosis not present

## 2023-03-16 DIAGNOSIS — E7800 Pure hypercholesterolemia, unspecified: Secondary | ICD-10-CM | POA: Diagnosis not present

## 2023-03-16 DIAGNOSIS — E559 Vitamin D deficiency, unspecified: Secondary | ICD-10-CM | POA: Diagnosis not present

## 2023-03-16 DIAGNOSIS — R5383 Other fatigue: Secondary | ICD-10-CM | POA: Diagnosis not present

## 2023-03-16 DIAGNOSIS — Z299 Encounter for prophylactic measures, unspecified: Secondary | ICD-10-CM | POA: Diagnosis not present

## 2023-03-16 DIAGNOSIS — Z0000 Encounter for general adult medical examination without abnormal findings: Secondary | ICD-10-CM | POA: Diagnosis not present

## 2023-03-16 DIAGNOSIS — I10 Essential (primary) hypertension: Secondary | ICD-10-CM | POA: Diagnosis not present

## 2023-03-16 DIAGNOSIS — J449 Chronic obstructive pulmonary disease, unspecified: Secondary | ICD-10-CM | POA: Diagnosis not present

## 2023-03-16 DIAGNOSIS — Z1331 Encounter for screening for depression: Secondary | ICD-10-CM | POA: Diagnosis not present

## 2023-03-20 DIAGNOSIS — E1165 Type 2 diabetes mellitus with hyperglycemia: Secondary | ICD-10-CM | POA: Diagnosis not present

## 2023-03-25 DIAGNOSIS — I83891 Varicose veins of right lower extremities with other complications: Secondary | ICD-10-CM | POA: Diagnosis not present

## 2023-04-20 DIAGNOSIS — E1165 Type 2 diabetes mellitus with hyperglycemia: Secondary | ICD-10-CM | POA: Diagnosis not present

## 2023-04-20 DIAGNOSIS — I10 Essential (primary) hypertension: Secondary | ICD-10-CM | POA: Diagnosis not present

## 2023-04-20 DIAGNOSIS — M1712 Unilateral primary osteoarthritis, left knee: Secondary | ICD-10-CM | POA: Diagnosis not present

## 2023-04-20 DIAGNOSIS — Z299 Encounter for prophylactic measures, unspecified: Secondary | ICD-10-CM | POA: Diagnosis not present

## 2023-04-21 ENCOUNTER — Other Ambulatory Visit: Payer: Self-pay | Admitting: Internal Medicine

## 2023-04-21 DIAGNOSIS — Z1231 Encounter for screening mammogram for malignant neoplasm of breast: Secondary | ICD-10-CM

## 2023-05-01 DIAGNOSIS — Z23 Encounter for immunization: Secondary | ICD-10-CM | POA: Diagnosis not present

## 2023-05-02 ENCOUNTER — Ambulatory Visit
Admission: RE | Admit: 2023-05-02 | Discharge: 2023-05-02 | Disposition: A | Discharge status: Home / Self Care | Payer: PPO | Source: Ambulatory Visit | Attending: Internal Medicine | Admitting: Internal Medicine

## 2023-05-02 DIAGNOSIS — Z1231 Encounter for screening mammogram for malignant neoplasm of breast: Secondary | ICD-10-CM | POA: Diagnosis not present

## 2023-05-20 DIAGNOSIS — E1165 Type 2 diabetes mellitus with hyperglycemia: Secondary | ICD-10-CM | POA: Diagnosis not present

## 2023-05-22 ENCOUNTER — Encounter: Payer: Self-pay | Admitting: Gastroenterology

## 2023-06-20 DIAGNOSIS — E1165 Type 2 diabetes mellitus with hyperglycemia: Secondary | ICD-10-CM | POA: Diagnosis not present

## 2023-07-20 DIAGNOSIS — E1165 Type 2 diabetes mellitus with hyperglycemia: Secondary | ICD-10-CM | POA: Diagnosis not present

## 2023-08-20 DIAGNOSIS — E1165 Type 2 diabetes mellitus with hyperglycemia: Secondary | ICD-10-CM | POA: Diagnosis not present

## 2023-08-21 DIAGNOSIS — Z299 Encounter for prophylactic measures, unspecified: Secondary | ICD-10-CM | POA: Diagnosis not present

## 2023-08-21 DIAGNOSIS — R52 Pain, unspecified: Secondary | ICD-10-CM | POA: Diagnosis not present

## 2023-08-21 DIAGNOSIS — E1169 Type 2 diabetes mellitus with other specified complication: Secondary | ICD-10-CM | POA: Diagnosis not present

## 2023-08-21 DIAGNOSIS — I779 Disorder of arteries and arterioles, unspecified: Secondary | ICD-10-CM | POA: Diagnosis not present

## 2023-08-21 DIAGNOSIS — J449 Chronic obstructive pulmonary disease, unspecified: Secondary | ICD-10-CM | POA: Diagnosis not present

## 2023-08-21 DIAGNOSIS — M545 Low back pain, unspecified: Secondary | ICD-10-CM | POA: Diagnosis not present

## 2023-08-21 DIAGNOSIS — I10 Essential (primary) hypertension: Secondary | ICD-10-CM | POA: Diagnosis not present

## 2023-09-20 DIAGNOSIS — E1165 Type 2 diabetes mellitus with hyperglycemia: Secondary | ICD-10-CM | POA: Diagnosis not present

## 2023-10-18 DIAGNOSIS — E1165 Type 2 diabetes mellitus with hyperglycemia: Secondary | ICD-10-CM | POA: Diagnosis not present

## 2023-11-18 DIAGNOSIS — E1165 Type 2 diabetes mellitus with hyperglycemia: Secondary | ICD-10-CM | POA: Diagnosis not present

## 2023-12-04 DIAGNOSIS — I779 Disorder of arteries and arterioles, unspecified: Secondary | ICD-10-CM | POA: Diagnosis not present

## 2023-12-04 DIAGNOSIS — Z0000 Encounter for general adult medical examination without abnormal findings: Secondary | ICD-10-CM | POA: Diagnosis not present

## 2023-12-04 DIAGNOSIS — Z299 Encounter for prophylactic measures, unspecified: Secondary | ICD-10-CM | POA: Diagnosis not present

## 2023-12-04 DIAGNOSIS — Z7189 Other specified counseling: Secondary | ICD-10-CM | POA: Diagnosis not present

## 2023-12-04 DIAGNOSIS — J449 Chronic obstructive pulmonary disease, unspecified: Secondary | ICD-10-CM | POA: Diagnosis not present

## 2023-12-04 DIAGNOSIS — Z1331 Encounter for screening for depression: Secondary | ICD-10-CM | POA: Diagnosis not present

## 2023-12-04 DIAGNOSIS — Z1339 Encounter for screening examination for other mental health and behavioral disorders: Secondary | ICD-10-CM | POA: Diagnosis not present

## 2023-12-04 DIAGNOSIS — G8194 Hemiplegia, unspecified affecting left nondominant side: Secondary | ICD-10-CM | POA: Diagnosis not present

## 2023-12-04 DIAGNOSIS — I10 Essential (primary) hypertension: Secondary | ICD-10-CM | POA: Diagnosis not present

## 2023-12-18 DIAGNOSIS — E1165 Type 2 diabetes mellitus with hyperglycemia: Secondary | ICD-10-CM | POA: Diagnosis not present

## 2024-01-05 ENCOUNTER — Ambulatory Visit (INDEPENDENT_AMBULATORY_CARE_PROVIDER_SITE_OTHER): Admitting: Internal Medicine

## 2024-01-05 ENCOUNTER — Encounter: Payer: Self-pay | Admitting: Internal Medicine

## 2024-01-05 VITALS — BP 143/71 | HR 93 | Temp 98.0°F | Ht 62.5 in | Wt 142.6 lb

## 2024-01-05 DIAGNOSIS — K219 Gastro-esophageal reflux disease without esophagitis: Secondary | ICD-10-CM | POA: Diagnosis not present

## 2024-01-05 DIAGNOSIS — R1310 Dysphagia, unspecified: Secondary | ICD-10-CM

## 2024-01-05 DIAGNOSIS — K222 Esophageal obstruction: Secondary | ICD-10-CM | POA: Diagnosis not present

## 2024-01-05 DIAGNOSIS — Z860100 Personal history of colon polyps, unspecified: Secondary | ICD-10-CM

## 2024-01-05 MED ORDER — PANTOPRAZOLE SODIUM 40 MG PO TBEC: 40.0000 mg | DELAYED_RELEASE_TABLET | Freq: Every day | ORAL | 11 refills | Status: DC

## 2024-01-05 MED ORDER — PANTOPRAZOLE SODIUM 20 MG PO TBEC
20.0000 mg | DELAYED_RELEASE_TABLET | Freq: Every day | ORAL | 11 refills | Status: AC
Start: 2024-01-05 — End: ?

## 2024-01-05 NOTE — Progress Notes (Signed)
 Primary Care Physician:  Theoplis Fix, MD Primary Gastroenterologist:  Dr. Riley Cheadle  Pre-Procedure History & Physical: HPI:  Natalie Anderson is a 78 y.o. female here for for follow-up of GERD and dysphagia.  History of Schatzki's ring dilated 2023.  She ran out of omeprazole  40 mg.  Last several months dysphagia has recurred.  She has gone to the OTC dose of 20 mg daily not as effective for control of reflux.  Dysphagia to solids and pills recurrent issue.  Has significant osteoporosis.  History of multiple colonic adenomas removed from her colon 2023; due for surveillance examination 2026.  Past Medical History:  Diagnosis Date   Arthritis    Asthma    as a child   Blood transfusion 07/25/1977   Cataract immature    bilateral   Chronic back pain    spondylosis and stenosis   Complication of anesthesia    stopped breathing after anesthesia   COPD (chronic obstructive pulmonary disease) (HCC)    Diabetes mellitus    takes Metformin  bid and Levimir bid and Victoza daily   Dizziness    occasionally   Dry skin    and itchy    Fibroid tumor    GERD (gastroesophageal reflux disease)    takes Protonix  daily   History of shingles 15-31yrs ago   Hyperlipidemia    takes Lipitor occasionally   Hypertension    takes Losartan daily   Impaired hearing    Joint pain    Joint swelling    Migraine    last one 3 wks ago   Nocturia    Osteoporosis    Osteoporosis    Pneumonia    hx of-double as a child   PONV (postoperative nausea and vomiting)    Seasonal allergies    takes Claritin  daily   Shortness of breath    with exertion   Urinary frequency    UTI (lower urinary tract infection)    hx of   Vertigo    hx of   Vitamin D deficiency    takes Vit D 50,000units on Sat    Past Surgical History:  Procedure Laterality Date   BACK SURGERY  12/24/2010   x2   CATARACT EXTRACTION W/PHACO Right 04/05/2021   Procedure: CATARACT EXTRACTION PHACO AND INTRAOCULAR LENS PLACEMENT  ([facility]);  Surgeon: Tarri Farm, MD;  Location: [facility];  Service: Ophthalmology;  Laterality: Right;  CDE 5.98   CATARACT EXTRACTION W/PHACO Left 04/19/2021   Procedure: CATARACT EXTRACTION PHACO AND INTRAOCULAR LENS PLACEMENT LEFT EYE;  Surgeon: Tarri Farm, MD;  Location: [facility];  Service: Ophthalmology;  Laterality: Left;  left CDE=5.90   COLONOSCOPY WITH PROPOFOL  N/A 12/09/2021   Surgeon: Suzette Espy, MD; Six 3 to 9 mm polyps removed and 1 hemostasis clip placed.  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  Recommended repeat in 3 years.   DILATION AND CURETTAGE OF UTERUS  07/25/1977   ESOPHAGOGASTRODUODENOSCOPY (EGD) WITH PROPOFOL  N/A 12/09/2021   Surgeon: Suzette Espy, MD; Moderate Schatzki ring. Dilated. Small hiatal hernia.   HEMOSTASIS CLIP PLACEMENT  12/09/2021   Procedure: HEMOSTASIS CLIP PLACEMENT;  Surgeon: Suzette Espy, MD;  [facility];  Service: Endoscopy;;   MALONEY DILATION N/A 12/09/2021   Procedure: Dorothyann Gather;  Surgeon: Suzette Espy, MD;  [facility];  Service: Endoscopy;  Laterality: N/A;   POLYPECTOMY  12/09/2021   Procedure: POLYPECTOMY;  Surgeon: Suzette Espy, MD;  [facility];  Service: Endoscopy;;    Prior to Admission medications   Medication Sig Start Date End Date Taking? Authorizing Provider  albuterol (PROVENTIL HFA;VENTOLIN HFA) 108 (90 BASE) MCG/ACT inhaler Inhale 2 puffs into the lungs every 6 (six) hours as needed for wheezing.   Yes [provider]  amLODipine (NORVASC) 5 MG tablet Take 5 mg by mouth every evening.   Yes [provider]  aspirin EC 81 MG tablet Take 81 mg by mouth in the morning. Swallow whole.   Yes [provider]  Cholecalciferol (VITAMIN D-3) 125 MCG (5000 UT) TABS Take 5,000 Units by mouth every evening.   Yes [provider]  fluticasone  (FLONASE) 50 MCG/ACT nasal spray Place 1 spray into both nostrils daily.  01/03/24  Yes [provider]  gabapentin  (NEURONTIN ) 300 MG capsule Take 600 mg by mouth 3 (three) times daily. Patient taking differently: Take 300 mg by mouth 2 (two) times daily.   Yes [provider]  loratadine  (CLARITIN ) 10 MG tablet Take 10 mg by mouth daily.   Yes [provider]  losartan (COZAAR) 100 MG tablet Take 100 mg by mouth in the morning.   Yes [provider]  metFORMIN  (GLUCOPHAGE ) 1000 MG tablet Take 1,000 mg by mouth 2 (two) times daily with a meal.   Yes [provider]  MOUNJARO 2.5 MG/0.5ML Pen Inject 2.5 mg into the skin once a week. 12/06/23  Yes [provider]  sitaGLIPtin (JANUVIA) 100 MG tablet Take 100 mg by mouth in the morning.   Yes [provider]  TRESIBA FLEXTOUCH 200 UNIT/ML FlexTouch Pen Inject 40 Units into the skin in the morning. 03/11/21  Yes [provider]    Allergies as of 01/05/2024 - Review Complete 01/05/2024  Allergen Reaction Noted   Latex Rash 10/04/2011   Morphine  and codeine Itching and Nausea And Vomiting 11/02/2011   Other Hives and Rash 10/04/2011   Amoxicillin Other (See Comments) 12/06/2021   Prednisone  04/01/2021   Nickel Rash 10/28/2011   Penicillins Hives 03/25/2023    Family History  Problem Relation Age of Onset   Breast cancer Mother    Lung cancer Mother    Colon polyps Sister    Colon cancer Paternal Grandmother        36   Colon cancer Cousin 19   Colon cancer Cousin 19   Colon cancer Cousin 21   Lung cancer Brother    Colon cancer Nephew 35   Colon cancer Niece 72   Anesthesia problems Neg Hx    Hypotension Neg Hx    Malignant hyperthermia Neg Hx    Pseudochol deficiency Neg Hx     Social History   Socioeconomic History   Marital status: Widowed    Spouse name: Not on file   Number of children: Not on file   Years of education: Not on file   Highest education level: Not on file  Occupational History   Not on file  Tobacco Use    Smoking status: Never   Smokeless tobacco: Never  Vaping Use   Vaping status: Never Used  Substance and Sexual Activity   Alcohol  use: No   Drug use: No   Sexual activity: Not Currently  Other Topics Concern   Not on file  Social History Narrative   Not on file   Social Drivers of Health   Financial Resource Strain: Not on file  Food Insecurity: Not on file  Transportation  Needs: Not on file  Physical Activity: Not on file  Stress: Not on file  Social Connections: Not on file  Intimate Partner Violence: Not on file    Review of Systems: See HPI, otherwise negative ROS  Physical Exam: BP (!) 143/71 (BP Location: Right Arm, Patient Position: Sitting, Cuff Size: Normal)   Pulse 93   Temp 98 F (36.7 C) (Oral)   Ht 5' 2.5 (1.588 m)   Wt 142 lb 9.6 oz (64.7 kg)   SpO2 97%   BMI 25.67 kg/m  General:   Alert,  Well-developed, well-nourished, pleasant and cooperative in NAD Neck:  Supple; no masses or thyromegaly. No significant cervical adenopathy. Lungs:  Clear throughout to auscultation.   No wheezes, crackles, or rhonchi. No acute distress. Heart:  Regular rate and rhythm; no murmurs, clicks, rubs,  or gallops. Abdomen: Non-distended, normal bowel sounds.  Soft and nontender without appreciable mass or hepatosplenomegaly.   Impression/Plan: 78 year old with GERD and known Schatzki's ring.  She is come off her PPI recently.  Not mentioned above, states she had leg pain related to prescription dose omeprazole .  Likely her ring is tightened up.  She would benefit from a repeat EGD with esophageal dilation  Will give her no more acid suppression therapy than is efficacious to control her reflux given severe osteoporosis.  History of multiple colonic polyps removed 2023; due for surveillance exam 2026.  Recommendations:  Will proceed with EGD with dilation as discussed.  The risks, benefits, limitations, alternatives and imponderables have been reviewed with the patient.  Potential for esophageal dilation, biopsy, etc. have also been reviewed.  Questions have been answered. All parties agreeable.   Begin pantoprazole  at a dose of 20 mg daily take 1 pill 30 minutes before breakfast every day.  Plan for surveillance colonoscopy 2026.   Notice: This dictation was prepared with Dragon dictation along with smaller phrase technology. Any transcriptional errors that result from this process are unintentional and may not be corrected upon review.

## 2024-01-05 NOTE — Patient Instructions (Signed)
 It was good to see you again today!  As discussed, you do benefit from having her esophagus stretched again.  To this, we will schedule an EGD with esophageal dilation (ASA 3).  Trouble swallowing is related to acid reflux.  You need to take acid reflux medicine every day.  Stop over-the-counter omeprazole   New prescription for pantoprazole  or Protonix  20 mg a day dispense 30 with 11 refills.  Take 20 mg 30 minutes for breakfast every day  You will be due for a colonoscopy in 2026  Further recommendations to follow.

## 2024-01-05 NOTE — Addendum Note (Signed)
 Addended by: Beatrice Lin on: 01/05/2024 08:50 AM   Modules accepted: Orders

## 2024-01-18 DIAGNOSIS — E1165 Type 2 diabetes mellitus with hyperglycemia: Secondary | ICD-10-CM | POA: Diagnosis not present

## 2024-02-17 DIAGNOSIS — E1165 Type 2 diabetes mellitus with hyperglycemia: Secondary | ICD-10-CM | POA: Diagnosis not present

## 2024-03-18 DIAGNOSIS — E785 Hyperlipidemia, unspecified: Secondary | ICD-10-CM | POA: Diagnosis not present

## 2024-03-18 DIAGNOSIS — R5383 Other fatigue: Secondary | ICD-10-CM | POA: Diagnosis not present

## 2024-03-18 DIAGNOSIS — Z0000 Encounter for general adult medical examination without abnormal findings: Secondary | ICD-10-CM | POA: Diagnosis not present

## 2024-03-18 DIAGNOSIS — E119 Type 2 diabetes mellitus without complications: Secondary | ICD-10-CM | POA: Diagnosis not present

## 2024-03-18 DIAGNOSIS — E559 Vitamin D deficiency, unspecified: Secondary | ICD-10-CM | POA: Diagnosis not present

## 2024-03-18 DIAGNOSIS — I10 Essential (primary) hypertension: Secondary | ICD-10-CM | POA: Diagnosis not present

## 2024-03-18 DIAGNOSIS — Z299 Encounter for prophylactic measures, unspecified: Secondary | ICD-10-CM | POA: Diagnosis not present

## 2024-03-18 DIAGNOSIS — R52 Pain, unspecified: Secondary | ICD-10-CM | POA: Diagnosis not present

## 2024-03-18 DIAGNOSIS — Z79899 Other long term (current) drug therapy: Secondary | ICD-10-CM | POA: Diagnosis not present

## 2024-03-19 DIAGNOSIS — Z299 Encounter for prophylactic measures, unspecified: Secondary | ICD-10-CM | POA: Diagnosis not present

## 2024-03-19 DIAGNOSIS — E1165 Type 2 diabetes mellitus with hyperglycemia: Secondary | ICD-10-CM | POA: Diagnosis not present

## 2024-03-19 DIAGNOSIS — R52 Pain, unspecified: Secondary | ICD-10-CM | POA: Diagnosis not present

## 2024-03-19 DIAGNOSIS — M1712 Unilateral primary osteoarthritis, left knee: Secondary | ICD-10-CM | POA: Diagnosis not present

## 2024-03-19 DIAGNOSIS — I10 Essential (primary) hypertension: Secondary | ICD-10-CM | POA: Diagnosis not present

## 2024-03-26 DIAGNOSIS — I10 Essential (primary) hypertension: Secondary | ICD-10-CM | POA: Diagnosis not present

## 2024-03-26 DIAGNOSIS — J45909 Unspecified asthma, uncomplicated: Secondary | ICD-10-CM | POA: Diagnosis not present

## 2024-03-26 DIAGNOSIS — Z299 Encounter for prophylactic measures, unspecified: Secondary | ICD-10-CM | POA: Diagnosis not present

## 2024-03-26 DIAGNOSIS — M1712 Unilateral primary osteoarthritis, left knee: Secondary | ICD-10-CM | POA: Diagnosis not present

## 2024-03-26 DIAGNOSIS — R079 Chest pain, unspecified: Secondary | ICD-10-CM | POA: Diagnosis not present

## 2024-03-26 DIAGNOSIS — R071 Chest pain on breathing: Secondary | ICD-10-CM | POA: Diagnosis not present

## 2024-04-07 NOTE — Progress Notes (Unsigned)
 GI Office Note    Referring Provider: Maree Isles, MD Primary Care Physician:  Maree Isles, MD  Primary Gastroenterologist: Ozell Hollingshead, MD   Chief Complaint   Chief Complaint  Patient presents with   Dysphagia    Here to schedule EGD w/dilation. Having trouble swallowing.    History of Present Illness   Natalie Anderson is a 78 y.o. female presenting today for follow up. Last seen 12/2023 for GERD, dysphagia with h/o schatzki's ring s/p dilation in 2023. H/o multiple colonic adnemomas in 2023, due surveillance in 2026. Patient had come off PPI due to leg pain felt to be related to prescrpition dose omeprazole . She was started on pantoprazole  20mg  daliy. Advised for EGD. ***never done?      Prior Data        Medications   Current Outpatient Medications  Medication Sig Dispense Refill   albuterol (PROVENTIL HFA;VENTOLIN HFA) 108 (90 BASE) MCG/ACT inhaler Inhale 2 puffs into the lungs every 6 (six) hours as needed for wheezing.     amLODipine (NORVASC) 5 MG tablet Take 5 mg by mouth every evening.     aspirin EC 81 MG tablet Take 81 mg by mouth in the morning. Swallow whole.     Cholecalciferol (VITAMIN D-3) 125 MCG (5000 UT) TABS Take 5,000 Units by mouth every evening.     fluticasone  (FLONASE) 50 MCG/ACT nasal spray Place 1 spray into both nostrils daily.     gabapentin  (NEURONTIN ) 300 MG capsule Take 600 mg by mouth 3 (three) times daily. (Patient taking differently: Take 300 mg by mouth 2 (two) times daily.)     loratadine  (CLARITIN ) 10 MG tablet Take 10 mg by mouth daily.     losartan (COZAAR) 100 MG tablet Take 100 mg by mouth in the morning.     metFORMIN  (GLUCOPHAGE ) 1000 MG tablet Take 1,000 mg by mouth 2 (two) times daily with a meal.     MOUNJARO 2.5 MG/0.5ML Pen Inject 2.5 mg into the skin once a week.     pantoprazole  (PROTONIX ) 20 MG tablet Take 1 tablet (20 mg total) by mouth daily. 30 tablet 11   rosuvastatin (CRESTOR) 5 MG tablet Take 5 mg by mouth  once a week.     TRESIBA FLEXTOUCH 200 UNIT/ML FlexTouch Pen Inject 40 Units into the skin in the morning.     No current facility-administered medications for this visit.    Allergies   Allergies as of 04/08/2024 - Review Complete 04/08/2024  Allergen Reaction Noted   Latex Rash 10/04/2011   Morphine  and codeine Itching and Nausea And Vomiting 11/02/2011   Other Hives and Rash 10/04/2011   Amoxicillin Other (See Comments) 12/06/2021   Prednisone  04/01/2021   Nickel Rash 10/28/2011   Penicillins Hives 03/25/2023     Past Medical History   Past Medical History:  Diagnosis Date   Arthritis    Asthma    as a child   Blood transfusion 07/25/1977   Cataract immature    bilateral   Chronic back pain    spondylosis and stenosis   Complication of anesthesia    stopped breathing after anesthesia   COPD (chronic obstructive pulmonary disease) (HCC)    Diabetes mellitus    takes Metformin  bid and Levimir bid and Victoza daily   Dizziness    occasionally   Dry skin    and itchy    Fibroid tumor    GERD (gastroesophageal reflux disease)    takes  Protonix  daily   History of shingles 15-53yrs ago   Hyperlipidemia    takes Lipitor occasionally   Hypertension    takes Losartan daily   Impaired hearing    Joint pain    Joint swelling    Migraine    last one 3 wks ago   Nocturia    Osteoporosis    Osteoporosis    Pneumonia    hx of-double as a child   PONV (postoperative nausea and vomiting)    Seasonal allergies    takes Claritin  daily   Shortness of breath    with exertion   Urinary frequency    UTI (lower urinary tract infection)    hx of   Vertigo    hx of   Vitamin D deficiency    takes Vit D 50,000units on Sat    Past Surgical History   Past Surgical History:  Procedure Laterality Date   BACK SURGERY  12/24/2010   x2   CATARACT EXTRACTION W/PHACO Right 04/05/2021   Procedure: CATARACT EXTRACTION PHACO AND INTRAOCULAR LENS PLACEMENT (IOC);  Surgeon:  Harrie Agent, MD;  Location: [facility];  Service: Ophthalmology;  Laterality: Right;  CDE 5.98   CATARACT EXTRACTION W/PHACO Left 04/19/2021   Procedure: CATARACT EXTRACTION PHACO AND INTRAOCULAR LENS PLACEMENT LEFT EYE;  Surgeon: Harrie Agent, MD;  Location: [facility];  Service: Ophthalmology;  Laterality: Left;  left CDE=5.90   COLONOSCOPY WITH PROPOFOL  N/A 12/09/2021   Surgeon: Shaaron Lamar HERO, MD; Six 3 to 9 mm polyps removed and 1 hemostasis clip placed.  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  Recommended repeat in 3 years.   DILATION AND CURETTAGE OF UTERUS  07/25/1977   ESOPHAGOGASTRODUODENOSCOPY (EGD) WITH PROPOFOL  N/A 12/09/2021   Surgeon: Shaaron Lamar HERO, MD; Moderate Schatzki ring. Dilated. Small hiatal hernia.   HEMOSTASIS CLIP PLACEMENT  12/09/2021   Procedure: HEMOSTASIS CLIP PLACEMENT;  Surgeon: Shaaron Lamar HERO, MD;  Location: [facility];  Service: Endoscopy;;   MALONEY DILATION N/A 12/09/2021   Procedure: AGAPITO HODGKIN;  Surgeon: Shaaron Lamar HERO, MD;  Location: [facility];  Service: Endoscopy;  Laterality: N/A;   POLYPECTOMY  12/09/2021   Procedure: POLYPECTOMY;  Surgeon: Shaaron Lamar HERO, MD;  [facility]: [facility];  Service: Endoscopy;;    Past Family History   Family History  Problem Relation Age of Onset   Breast cancer Mother    Lung cancer Mother    Colon polyps Sister    Colon cancer Paternal Grandmother        63   Colon cancer Cousin 19   Colon cancer Cousin 19   Colon cancer Cousin 21   Lung cancer Brother    Colon cancer Nephew 35   Colon cancer Niece 26   Anesthesia problems Neg Hx    Hypotension Neg Hx    Malignant hyperthermia Neg Hx    Pseudochol deficiency Neg Hx     Past Social History   Social History   Socioeconomic History   Marital status: Widowed    Spouse name: Not on file   Number of children: Not on file   Years of education: Not on file   Highest education level: Not on file  Occupational  History   Not on file  Tobacco Use   Smoking status: Never   Smokeless tobacco: Never  Vaping Use   Vaping status: Never Used  Substance and Sexual Activity   Alcohol  use: No   Drug use:  No   Sexual activity: Not Currently  Other Topics Concern   Not on file  Social History Narrative   Not on file   Social Drivers of Health   Financial Resource Strain: Not on file  Food Insecurity: Not on file  Transportation Needs: Not on file  Physical Activity: Not on file  Stress: Not on file  Social Connections: Not on file  Intimate Partner Violence: Not on file    Review of Systems   General: Negative for anorexia, weight loss, fever, chills, fatigue, weakness. ENT: Negative for hoarseness, difficulty swallowing , nasal congestion. CV: Negative for chest pain, angina, palpitations, dyspnea on exertion, peripheral edema.  Respiratory: Negative for dyspnea at rest, dyspnea on exertion, cough, sputum, wheezing.  GI: See history of present illness. GU:  Negative for dysuria, hematuria, urinary incontinence, urinary frequency, nocturnal urination.  Endo: Negative for unusual weight change.     Physical Exam   BP (!) 153/75 (BP Location: Right Arm, Patient Position: Sitting, Cuff Size: Normal)   Pulse (!) 105   Temp 98.3 F (36.8 C) (Oral)   Ht 5' 2.5 (1.588 m)   Wt 143 lb 6.4 oz (65 kg)   SpO2 97%   BMI 25.81 kg/m    General: Well-nourished, well-developed in no acute distress.  Eyes: No icterus. Mouth: Oropharyngeal mucosa moist and pink   Lungs: Clear to auscultation bilaterally.  Heart: Regular rate and rhythm, no murmurs rubs or gallops.  Abdomen: Bowel sounds are normal, nontender, nondistended, no hepatosplenomegaly or masses,  no abdominal bruits or hernia , no rebound or guarding.  Rectal: not performed Extremities: No lower extremity edema. No clubbing or deformities. Neuro: Alert and oriented x 4   Skin: Warm and dry, no jaundice.   Psych: Alert and  cooperative, normal mood and affect.  Labs   *** Imaging Studies   No results found.  Assessment/Plan:           Sonny RAMAN. Ezzard, MHS, PA-C [facility]

## 2024-04-08 ENCOUNTER — Encounter: Payer: Self-pay | Admitting: Gastroenterology

## 2024-04-08 ENCOUNTER — Ambulatory Visit (INDEPENDENT_AMBULATORY_CARE_PROVIDER_SITE_OTHER): Admitting: Gastroenterology

## 2024-04-08 VITALS — BP 153/75 | HR 105 | Temp 98.3°F | Ht 62.5 in | Wt 143.4 lb

## 2024-04-08 DIAGNOSIS — R1319 Other dysphagia: Secondary | ICD-10-CM

## 2024-04-08 DIAGNOSIS — Z860100 Personal history of colon polyps, unspecified: Secondary | ICD-10-CM

## 2024-04-08 DIAGNOSIS — K219 Gastro-esophageal reflux disease without esophagitis: Secondary | ICD-10-CM | POA: Diagnosis not present

## 2024-04-08 NOTE — Patient Instructions (Signed)
 Please let us  know when you stress test has been completed. We will need to wait until your heart checks out before we can schedule upper endoscopy.

## 2024-04-15 DIAGNOSIS — R079 Chest pain, unspecified: Secondary | ICD-10-CM | POA: Diagnosis not present

## 2024-04-17 DIAGNOSIS — R52 Pain, unspecified: Secondary | ICD-10-CM | POA: Diagnosis not present

## 2024-04-17 DIAGNOSIS — Z299 Encounter for prophylactic measures, unspecified: Secondary | ICD-10-CM | POA: Diagnosis not present

## 2024-04-17 DIAGNOSIS — I10 Essential (primary) hypertension: Secondary | ICD-10-CM | POA: Diagnosis not present

## 2024-04-17 DIAGNOSIS — G8194 Hemiplegia, unspecified affecting left nondominant side: Secondary | ICD-10-CM | POA: Diagnosis not present

## 2024-04-17 DIAGNOSIS — J449 Chronic obstructive pulmonary disease, unspecified: Secondary | ICD-10-CM | POA: Diagnosis not present

## 2024-04-17 DIAGNOSIS — E1169 Type 2 diabetes mellitus with other specified complication: Secondary | ICD-10-CM | POA: Diagnosis not present

## 2024-04-19 DIAGNOSIS — E1165 Type 2 diabetes mellitus with hyperglycemia: Secondary | ICD-10-CM | POA: Diagnosis not present

## 2024-05-07 DIAGNOSIS — R931 Abnormal findings on diagnostic imaging of heart and coronary circulation: Secondary | ICD-10-CM | POA: Diagnosis not present

## 2024-05-07 DIAGNOSIS — E119 Type 2 diabetes mellitus without complications: Secondary | ICD-10-CM | POA: Diagnosis not present

## 2024-05-07 DIAGNOSIS — Z01810 Encounter for preprocedural cardiovascular examination: Secondary | ICD-10-CM | POA: Diagnosis not present

## 2024-05-07 DIAGNOSIS — I10 Essential (primary) hypertension: Secondary | ICD-10-CM | POA: Diagnosis not present

## 2024-05-07 DIAGNOSIS — E785 Hyperlipidemia, unspecified: Secondary | ICD-10-CM | POA: Diagnosis not present

## 2024-05-07 DIAGNOSIS — R0789 Other chest pain: Secondary | ICD-10-CM | POA: Diagnosis not present

## 2024-05-09 ENCOUNTER — Encounter: Payer: Self-pay | Admitting: Internal Medicine

## 2024-05-09 DIAGNOSIS — R9439 Abnormal result of other cardiovascular function study: Secondary | ICD-10-CM | POA: Diagnosis not present

## 2024-05-09 DIAGNOSIS — E1169 Type 2 diabetes mellitus with other specified complication: Secondary | ICD-10-CM | POA: Diagnosis not present

## 2024-05-09 DIAGNOSIS — R0602 Shortness of breath: Secondary | ICD-10-CM | POA: Diagnosis not present

## 2024-05-09 DIAGNOSIS — I10 Essential (primary) hypertension: Secondary | ICD-10-CM | POA: Diagnosis not present

## 2024-05-09 DIAGNOSIS — R52 Pain, unspecified: Secondary | ICD-10-CM | POA: Diagnosis not present

## 2024-05-09 DIAGNOSIS — Z299 Encounter for prophylactic measures, unspecified: Secondary | ICD-10-CM | POA: Diagnosis not present

## 2024-05-09 DIAGNOSIS — Z23 Encounter for immunization: Secondary | ICD-10-CM | POA: Diagnosis not present

## 2024-05-10 ENCOUNTER — Ambulatory Visit: Admitting: Cardiology

## 2024-05-13 ENCOUNTER — Telehealth: Payer: Self-pay | Admitting: Cardiology

## 2024-05-13 ENCOUNTER — Encounter: Payer: Self-pay | Admitting: Cardiology

## 2024-05-13 ENCOUNTER — Encounter: Payer: Self-pay | Admitting: *Deleted

## 2024-05-13 ENCOUNTER — Ambulatory Visit: Attending: Cardiology | Admitting: Cardiology

## 2024-05-13 ENCOUNTER — Telehealth: Payer: Self-pay | Admitting: Gastroenterology

## 2024-05-13 VITALS — BP 140/72 | HR 96 | Ht 62.5 in | Wt 142.2 lb

## 2024-05-13 DIAGNOSIS — Z01812 Encounter for preprocedural laboratory examination: Secondary | ICD-10-CM | POA: Diagnosis not present

## 2024-05-13 DIAGNOSIS — R9439 Abnormal result of other cardiovascular function study: Secondary | ICD-10-CM

## 2024-05-13 DIAGNOSIS — R0789 Other chest pain: Secondary | ICD-10-CM

## 2024-05-13 NOTE — H&P (View-Only) (Signed)
 Clinical Summary Natalie Anderson is a 78 y.o.female seen today as a new consult, referred by Dr Maree for the following medical problems.   1.SOB/Chest pain - episode about 5-6 weeks ago - was walking in her car port. Mid to left chest, cutting like pain 10/10. +SOB. Lasted about 15-20 minutes.  - since that time reoccuring pain daily, typically with activity. Significant DOE.  - CAD risk factors: DM2, HTN, HLD   - 03/2024 echo with pcp: LVEF 72%, no WMAs, grade I dd, no significant valve pathology -04/2024 nuclear stress at [facility] ordered by pcp (in care everywhere): large severe intensity partially reersible apical, apical septal, apical anterior, mid anterior, mid anteroseptal defect. Mild TID      Past Medical History:  Diagnosis Date   Arthritis    Asthma    as a child   Blood transfusion 07/25/1977   Cataract immature    bilateral   Chronic back pain    spondylosis and stenosis   Complication of anesthesia    stopped breathing after anesthesia   COPD (chronic obstructive pulmonary disease) (HCC)    Diabetes mellitus    takes Metformin  bid and Levimir bid and Victoza daily   Dizziness    occasionally   Dry skin    and itchy    Fibroid tumor    GERD (gastroesophageal reflux disease)    takes Protonix  daily   History of shingles 15-45yrs ago   Hyperlipidemia    takes Lipitor occasionally   Hypertension    takes Losartan daily   Impaired hearing    Joint pain    Joint swelling    Migraine    last one 3 wks ago   Nocturia    Osteoporosis    Osteoporosis    Pneumonia    hx of-double as a child   PONV (postoperative nausea and vomiting)    Seasonal allergies    takes Claritin  daily   Shortness of breath    with exertion   Urinary frequency    UTI (lower urinary tract infection)    hx of   Vertigo    hx of   Vitamin D deficiency    takes Vit D 50,000units on Sat     Allergies  Allergen Reactions   Latex Rash    Blisters    Morphine  And Codeine  Itching and Nausea And Vomiting   Other Hives and Rash    Strawberries and oranges    Amoxicillin Other (See Comments)    Caused mouth sores, and Gastric issues    Prednisone     Makes me bleed out of my mouth   Nickel Rash   Penicillins Hives     Current Outpatient Medications  Medication Sig Dispense Refill   albuterol (PROVENTIL HFA;VENTOLIN HFA) 108 (90 BASE) MCG/ACT inhaler Inhale 2 puffs into the lungs every 6 (six) hours as needed for wheezing.     amLODipine (NORVASC) 5 MG tablet Take 5 mg by mouth every evening.     aspirin EC 81 MG tablet Take 81 mg by mouth in the morning. Swallow whole.     Cholecalciferol (VITAMIN D-3) 125 MCG (5000 UT) TABS Take 5,000 Units by mouth every evening.     fluticasone  (FLONASE) 50 MCG/ACT nasal spray Place 1 spray into both nostrils daily.     gabapentin  (NEURONTIN ) 300 MG capsule Take 600 mg by mouth 3 (three) times daily. (Patient taking differently: Take 300 mg by mouth 2 (two) times  daily.)     loratadine  (CLARITIN ) 10 MG tablet Take 10 mg by mouth daily.     losartan (COZAAR) 100 MG tablet Take 100 mg by mouth in the morning.     metFORMIN  (GLUCOPHAGE ) 1000 MG tablet Take 1,000 mg by mouth 2 (two) times daily with a meal.     MOUNJARO 2.5 MG/0.5ML Pen Inject 2.5 mg into the skin once a week.     pantoprazole  (PROTONIX ) 20 MG tablet Take 1 tablet (20 mg total) by mouth daily. 30 tablet 11   rosuvastatin (CRESTOR) 5 MG tablet Take 5 mg by mouth once a week.     TRESIBA FLEXTOUCH 200 UNIT/ML FlexTouch Pen Inject 40 Units into the skin in the morning.     No current facility-administered medications for this visit.     Past Surgical History:  Procedure Laterality Date   BACK SURGERY  12/24/2010   x2   CATARACT EXTRACTION W/PHACO Right 04/05/2021   Procedure: CATARACT EXTRACTION PHACO AND INTRAOCULAR LENS PLACEMENT (IOC);  Surgeon: Harrie Agent, MD;  Location: [facility];  Service: Ophthalmology;  Laterality: Right;  CDE 5.98   CATARACT  EXTRACTION W/PHACO Left 04/19/2021   Procedure: CATARACT EXTRACTION PHACO AND INTRAOCULAR LENS PLACEMENT LEFT EYE;  Surgeon: Harrie Agent, MD;  Location: [facility];  Service: Ophthalmology;  Laterality: Left;  left CDE=5.90   COLONOSCOPY WITH PROPOFOL  N/A 12/09/2021   Surgeon: Shaaron Lamar HERO, MD; Six 3 to 9 mm polyps removed and 1 hemostasis clip placed.  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  Recommended repeat in 3 years.   DILATION AND CURETTAGE OF UTERUS  07/25/1977   ESOPHAGOGASTRODUODENOSCOPY (EGD) WITH PROPOFOL  N/A 12/09/2021   Surgeon: Shaaron Lamar HERO, MD; Moderate Schatzki ring. Dilated. Small hiatal hernia.   HEMOSTASIS CLIP PLACEMENT  12/09/2021   Procedure: HEMOSTASIS CLIP PLACEMENT;  Surgeon: Shaaron Lamar HERO, MD;  [facility];  Service: Endoscopy;;   MALONEY DILATION N/A 12/09/2021   Procedure: AGAPITO HODGKIN;  Surgeon: Shaaron Lamar HERO, MD;  [facility];  Service: Endoscopy;  Laterality: N/A;   POLYPECTOMY  12/09/2021   Procedure: POLYPECTOMY;  Surgeon: Shaaron Lamar HERO, MD;  [facility];  Service: Endoscopy;;     Allergies  Allergen Reactions   Latex Rash    Blisters    Morphine  And Codeine Itching and Nausea And Vomiting   Other Hives and Rash    Strawberries and oranges    Amoxicillin Other (See Comments)    Caused mouth sores, and Gastric issues    Prednisone     Makes me bleed out of my mouth   Nickel Rash   Penicillins Hives      Family History  Problem Relation Age of Onset   Breast cancer Mother    Lung cancer Mother    Colon polyps Sister    Colon cancer Paternal Grandmother        38   Colon cancer Cousin 19   Colon cancer Cousin 19   Colon cancer Cousin 21   Lung cancer Brother    Colon cancer Nephew 35   Colon cancer Niece 36   Anesthesia problems Neg Hx    Hypotension Neg Hx    Malignant hyperthermia Neg Hx    Pseudochol deficiency Neg Hx      Social History [name] reports that she has never smoked. She has never used smokeless tobacco. Natalie Anderson reports no history of alcohol  use.  Review of Systems CONSTITUTIONAL: No weight loss, fever, chills, weakness or fatigue.  HEENT: Eyes: No visual loss, blurred vision, double vision or yellow sclerae.No hearing loss, sneezing, congestion, runny nose or sore throat.  SKIN: No rash or itching.  CARDIOVASCULAR:  RESPIRATORY: No shortness of breath, cough or sputum.  GASTROINTESTINAL: No anorexia, nausea, vomiting or diarrhea. No abdominal pain or blood.  GENITOURINARY: No burning on urination, no polyuria NEUROLOGICAL: No headache, dizziness, syncope, paralysis, ataxia, numbness or tingling in the extremities. No change in bowel or bladder control.  MUSCULOSKELETAL: No muscle, back pain, joint pain or stiffness.  LYMPHATICS: No enlarged nodes. No history of splenectomy.  PSYCHIATRIC: No history of depression or anxiety.  ENDOCRINOLOGIC: No reports of sweating, cold or heat intolerance. No polyuria or polydipsia.  Natalie Anderson   Physical Examination Today's Vitals   05/13/24 1116  BP: (!) 140/72  Pulse: 96  SpO2: 95%  Weight: 142 lb 3.2 oz (64.5 kg)  Height: 5' 2.5 (1.588 m)   Body mass index is 25.59 kg/m.  Gen: resting comfortably, no acute distress HEENT: no scleral icterus, pupils equal round and reactive, no palptable cervical adenopathy,  CV Resp: Clear to auscultation bilaterally GI: abdomen is soft, non-tender, non-distended, normal bowel sounds, no hepatosplenomegaly MSK: extremities are warm, no edema.  Skin: warm, no rash Neuro:  no focal deficits Psych: appropriate affect    Assessment and Plan  1.DOE/Chest pain - multiple CAD risk factors including DM2, HTN, HLD, age - pcp had sent for nuclear stress test at [facility], high risk findings with evidence of ischemia as outlined above (results in care everywhere) - echo was overall benign - EKG today NSR, no acute ischemic changes  -  exertional symptoms, CAD risk factors, high risk nuclear stress test. Will plan for left heart cath for further evaluation  Informed Consent   Shared Decision Making/Informed Consent The risks [stroke (1 in 1000), death (1 in 1000), kidney failure [usually temporary] (1 in 500), bleeding (1 in 200), allergic reaction [possibly serious] (1 in 200)], benefits (diagnostic support and management of coronary artery disease) and alternatives of a cardiac catheterization were discussed in detail with Natalie Anderson and she is willing to proceed.          Dorn PHEBE Ross, M.D.

## 2024-05-13 NOTE — Telephone Encounter (Signed)
 Pt was made aware and verbalized understanding.

## 2024-05-13 NOTE — Telephone Encounter (Signed)
 EGD/ED has not been scheduled. I saw her last month, she had pending stress test.  Stress test completed 05/07/24.  Impressions:  - Abnormal possibly high risk study  - There is a large in size, severe, partially reversible defect involving  the apical, apical septal, apical anterior, mid anterior and mid  anteroseptal segments. This is consistent with probable ischemia and  cannot rule out scar.  - Mild transient LV dilation was noted with stress, while this finding is  non-specific, it can be associated with so called diffuse balanced  ischemia  - Post stress: Global systolic function is overall normal. The ejection  fraction was greater than 65%.  Tammy C: PLEASE REACH OUT TO PATIENT. Advise patient to let us  know when cardiac work up is complete. I anticipate that she will like need another ov prior to EGD/ED especially if she ends up with any stents.    Ladonna: Please NIC for follow with Dr. Shaaron or me in 3 months

## 2024-05-13 NOTE — Progress Notes (Signed)
 Clinical Summary Natalie Anderson is a 78 y.o.female seen today as a new consult, referred by Dr Maree for the following medical problems.   1.SOB/Chest pain - episode about 5-6 weeks ago - was walking in her car port. Mid to left chest, cutting like pain 10/10. +SOB. Lasted about 15-20 minutes.  - since that time reoccuring pain daily, typically with activity. Significant DOE.  - CAD risk factors: DM2, HTN, HLD   - 03/2024 echo with pcp: LVEF 72%, no WMAs, grade I dd, no significant valve pathology -04/2024 nuclear stress at [facility] ordered by pcp (in care everywhere): large severe intensity partially reersible apical, apical septal, apical anterior, mid anterior, mid anteroseptal defect. Mild TID      Past Medical History:  Diagnosis Date   Arthritis    Asthma    as a child   Blood transfusion 07/25/1977   Cataract immature    bilateral   Chronic back pain    spondylosis and stenosis   Complication of anesthesia    stopped breathing after anesthesia   COPD (chronic obstructive pulmonary disease) (HCC)    Diabetes mellitus    takes Metformin  bid and Levimir bid and Victoza daily   Dizziness    occasionally   Dry skin    and itchy    Fibroid tumor    GERD (gastroesophageal reflux disease)    takes Protonix  daily   History of shingles 15-45yrs ago   Hyperlipidemia    takes Lipitor occasionally   Hypertension    takes Losartan daily   Impaired hearing    Joint pain    Joint swelling    Migraine    last one 3 wks ago   Nocturia    Osteoporosis    Osteoporosis    Pneumonia    hx of-double as a child   PONV (postoperative nausea and vomiting)    Seasonal allergies    takes Claritin  daily   Shortness of breath    with exertion   Urinary frequency    UTI (lower urinary tract infection)    hx of   Vertigo    hx of   Vitamin D deficiency    takes Vit D 50,000units on Sat     Allergies  Allergen Reactions   Latex Rash    Blisters    Morphine  And Codeine  Itching and Nausea And Vomiting   Other Hives and Rash    Strawberries and oranges    Amoxicillin Other (See Comments)    Caused mouth sores, and Gastric issues    Prednisone     Makes me bleed out of my mouth   Nickel Rash   Penicillins Hives     Current Outpatient Medications  Medication Sig Dispense Refill   albuterol (PROVENTIL HFA;VENTOLIN HFA) 108 (90 BASE) MCG/ACT inhaler Inhale 2 puffs into the lungs every 6 (six) hours as needed for wheezing.     amLODipine (NORVASC) 5 MG tablet Take 5 mg by mouth every evening.     aspirin EC 81 MG tablet Take 81 mg by mouth in the morning. Swallow whole.     Cholecalciferol (VITAMIN D-3) 125 MCG (5000 UT) TABS Take 5,000 Units by mouth every evening.     fluticasone  (FLONASE) 50 MCG/ACT nasal spray Place 1 spray into both nostrils daily.     gabapentin  (NEURONTIN ) 300 MG capsule Take 600 mg by mouth 3 (three) times daily. (Patient taking differently: Take 300 mg by mouth 2 (two) times  daily.)     loratadine  (CLARITIN ) 10 MG tablet Take 10 mg by mouth daily.     losartan (COZAAR) 100 MG tablet Take 100 mg by mouth in the morning.     metFORMIN  (GLUCOPHAGE ) 1000 MG tablet Take 1,000 mg by mouth 2 (two) times daily with a meal.     MOUNJARO 2.5 MG/0.5ML Pen Inject 2.5 mg into the skin once a week.     pantoprazole  (PROTONIX ) 20 MG tablet Take 1 tablet (20 mg total) by mouth daily. 30 tablet 11   rosuvastatin (CRESTOR) 5 MG tablet Take 5 mg by mouth once a week.     TRESIBA FLEXTOUCH 200 UNIT/ML FlexTouch Pen Inject 40 Units into the skin in the morning.     No current facility-administered medications for this visit.     Past Surgical History:  Procedure Laterality Date   BACK SURGERY  12/24/2010   x2   CATARACT EXTRACTION W/PHACO Right 04/05/2021   Procedure: CATARACT EXTRACTION PHACO AND INTRAOCULAR LENS PLACEMENT (IOC);  Surgeon: Harrie Agent, MD;  Location: [facility];  Service: Ophthalmology;  Laterality: Right;  CDE 5.98   CATARACT  EXTRACTION W/PHACO Left 04/19/2021   Procedure: CATARACT EXTRACTION PHACO AND INTRAOCULAR LENS PLACEMENT LEFT EYE;  Surgeon: Harrie Agent, MD;  Location: [facility];  Service: Ophthalmology;  Laterality: Left;  left CDE=5.90   COLONOSCOPY WITH PROPOFOL  N/A 12/09/2021   Surgeon: Shaaron Lamar HERO, MD; Six 3 to 9 mm polyps removed and 1 hemostasis clip placed.  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  Recommended repeat in 3 years.   DILATION AND CURETTAGE OF UTERUS  07/25/1977   ESOPHAGOGASTRODUODENOSCOPY (EGD) WITH PROPOFOL  N/A 12/09/2021   Surgeon: Shaaron Lamar HERO, MD; Moderate Schatzki ring. Dilated. Small hiatal hernia.   HEMOSTASIS CLIP PLACEMENT  12/09/2021   Procedure: HEMOSTASIS CLIP PLACEMENT;  Surgeon: Shaaron Lamar HERO, MD;  Location: [facility];  Service: Endoscopy;;   MALONEY DILATION N/A 12/09/2021   Procedure: AGAPITO HODGKIN;  Surgeon: Shaaron Lamar HERO, MD;  Location: [facility];  Service: Endoscopy;  Laterality: N/A;   POLYPECTOMY  12/09/2021   Procedure: POLYPECTOMY;  Surgeon: Shaaron Lamar HERO, MD;  Location: [facility];  Service: Endoscopy;;     Allergies  Allergen Reactions   Latex Rash    Blisters    Morphine  And Codeine Itching and Nausea And Vomiting   Other Hives and Rash    Strawberries and oranges    Amoxicillin Other (See Comments)    Caused mouth sores, and Gastric issues    Prednisone     Makes me bleed out of my mouth   Nickel Rash   Penicillins Hives      Family History  Problem Relation Age of Onset   Breast cancer Mother    Lung cancer Mother    Colon polyps Sister    Colon cancer Paternal Grandmother        38   Colon cancer Cousin 19   Colon cancer Cousin 19   Colon cancer Cousin 21   Lung cancer Brother    Colon cancer Nephew 35   Colon cancer Niece 36   Anesthesia problems Neg Hx    Hypotension Neg Hx    Malignant hyperthermia Neg Hx    Pseudochol deficiency Neg Hx      Social History Ms.  Rybolt reports that she has never smoked. She has never used smokeless tobacco. Ms. Lapre reports no history of alcohol  use.  Review of Systems CONSTITUTIONAL: No weight loss, fever, chills, weakness or fatigue.  HEENT: Eyes: No visual loss, blurred vision, double vision or yellow sclerae.No hearing loss, sneezing, congestion, runny nose or sore throat.  SKIN: No rash or itching.  CARDIOVASCULAR:  RESPIRATORY: No shortness of breath, cough or sputum.  GASTROINTESTINAL: No anorexia, nausea, vomiting or diarrhea. No abdominal pain or blood.  GENITOURINARY: No burning on urination, no polyuria NEUROLOGICAL: No headache, dizziness, syncope, paralysis, ataxia, numbness or tingling in the extremities. No change in bowel or bladder control.  MUSCULOSKELETAL: No muscle, back pain, joint pain or stiffness.  LYMPHATICS: No enlarged nodes. No history of splenectomy.  PSYCHIATRIC: No history of depression or anxiety.  ENDOCRINOLOGIC: No reports of sweating, cold or heat intolerance. No polyuria or polydipsia.  SABRA   Physical Examination Today's Vitals   05/13/24 1116  BP: (!) 140/72  Pulse: 96  SpO2: 95%  Weight: 142 lb 3.2 oz (64.5 kg)  Height: 5' 2.5 (1.588 m)   Body mass index is 25.59 kg/m.  Gen: resting comfortably, no acute distress HEENT: no scleral icterus, pupils equal round and reactive, no palptable cervical adenopathy,  CV Resp: Clear to auscultation bilaterally GI: abdomen is soft, non-tender, non-distended, normal bowel sounds, no hepatosplenomegaly MSK: extremities are warm, no edema.  Skin: warm, no rash Neuro:  no focal deficits Psych: appropriate affect    Assessment and Plan  1.DOE/Chest pain - multiple CAD risk factors including DM2, HTN, HLD, age - pcp had sent for nuclear stress test at [facility], high risk findings with evidence of ischemia as outlined above (results in care everywhere) - echo was overall benign - EKG today NSR, no acute ischemic changes  -  exertional symptoms, CAD risk factors, high risk nuclear stress test. Will plan for left heart cath for further evaluation  Informed Consent   Shared Decision Making/Informed Consent The risks [stroke (1 in 1000), death (1 in 1000), kidney failure [usually temporary] (1 in 500), bleeding (1 in 200), allergic reaction [possibly serious] (1 in 200)], benefits (diagnostic support and management of coronary artery disease) and alternatives of a cardiac catheterization were discussed in detail with Ms. Delany and she is willing to proceed.          Dorn PHEBE Ross, M.D.

## 2024-05-13 NOTE — Patient Instructions (Signed)
 Medication Instructions:   Continue all current medications.   Labwork:  BMET, CBC - orders given today   Testing/Procedures:  Your physician has requested that you have a cardiac catheterization. Cardiac catheterization is used to diagnose and/or treat various heart conditions. Doctors may recommend this procedure for a number of different reasons. The most common reason is to evaluate chest pain. Chest pain can be a symptom of coronary artery disease (CAD), and cardiac catheterization can show whether plaque is narrowing or blocking your heart's arteries. This procedure is also used to evaluate the valves, as well as measure the blood flow and oxygen levels in different parts of your heart. For further information please visit [email or web address] Please follow instruction sheet, as given.   Follow-Up:  4-6 weeks    Any Other Special Instructions Will Be Listed Below (If Applicable).   If you need a refill on your cardiac medications before your next appointment, please call your pharmacy.

## 2024-05-13 NOTE — Telephone Encounter (Signed)
 Checking percert on the following patient for     LEFT HEART CATH   05-17-2024

## 2024-05-14 ENCOUNTER — Ambulatory Visit: Payer: Self-pay | Admitting: Cardiology

## 2024-05-14 NOTE — Telephone Encounter (Signed)
-----   Message from Alvan Carrier sent at 05/14/2024  4:36 PM EDT ----- Low sodium probably from the hydrochlorothiazide, would discontinue. Sodium would not prohibit cath for her  JINNY Alvan MD ----- Message ----- From: Rebecka Fleeta Higashi In One Three One Sent: 05/13/2024   4:30 PM EDT To: Carrier JULIANNA Alvan, MD

## 2024-05-14 NOTE — Telephone Encounter (Signed)
 Notified, copy to pcp.

## 2024-05-16 ENCOUNTER — Telehealth: Payer: Self-pay | Admitting: *Deleted

## 2024-05-16 NOTE — Telephone Encounter (Signed)
 Cardiac Catheterization scheduled at [facility] for: Friday May 17, 2024 10:30 AM Arrival time [facility] Main Entrance A at: 8:30 AM  Diet: -Nothing to eat after midnight.  Hydration: -May drink clear liquids until 2 hours before the procedure.  Approved liquids: Water , clear tea, black coffee, fruit juices-non-citric and without pulp,Gatorade, plain Jello/popsicles.   -Please drink 16 oz of water  2 hours before procedure.  Medication instructions: -Hold:  Tresiba-AM of procedure  Metformin -day of procedure and 48 hours post procedure  Mounjaro-weekly on Fridays-will hold AM of procedure (Friday)  Losartan-AM of procedure-eGFR 58 -Other usual morning medications can be taken including aspirin 81 mg.  Plan to go home the same day, you will only stay overnight if medically necessary.  You must have responsible adult to drive you home.  Someone must be with you the first 24 hours after you arrive home.  Reviewed procedure instructions with patient. Notes indicate hydrochlorothiazide has been discontinued.

## 2024-05-17 ENCOUNTER — Other Ambulatory Visit: Payer: Self-pay

## 2024-05-17 ENCOUNTER — Inpatient Hospital Stay (HOSPITAL_COMMUNITY)
Admission: AD | Admit: 2024-05-17 | Discharge: 2024-05-28 | DRG: 233 | Disposition: A | Discharge status: Home Health | Attending: Thoracic Surgery (Cardiothoracic Vascular Surgery) | Admitting: Thoracic Surgery (Cardiothoracic Vascular Surgery)

## 2024-05-17 ENCOUNTER — Encounter (HOSPITAL_COMMUNITY)
Admission: AD | Disposition: A | Discharge status: Home Health | Payer: Self-pay | Source: Home / Self Care | Attending: Thoracic Surgery (Cardiothoracic Vascular Surgery)

## 2024-05-17 DIAGNOSIS — Z79899 Other long term (current) drug therapy: Secondary | ICD-10-CM | POA: Diagnosis not present

## 2024-05-17 DIAGNOSIS — Z91018 Allergy to other foods: Secondary | ICD-10-CM

## 2024-05-17 DIAGNOSIS — I25110 Atherosclerotic heart disease of native coronary artery with unstable angina pectoris: Secondary | ICD-10-CM | POA: Diagnosis not present

## 2024-05-17 DIAGNOSIS — Z885 Allergy status to narcotic agent status: Secondary | ICD-10-CM | POA: Diagnosis not present

## 2024-05-17 DIAGNOSIS — Z01818 Encounter for other preprocedural examination: Secondary | ICD-10-CM | POA: Diagnosis not present

## 2024-05-17 DIAGNOSIS — M549 Dorsalgia, unspecified: Secondary | ICD-10-CM | POA: Diagnosis present

## 2024-05-17 DIAGNOSIS — R918 Other nonspecific abnormal finding of lung field: Secondary | ICD-10-CM | POA: Diagnosis not present

## 2024-05-17 DIAGNOSIS — Z452 Encounter for adjustment and management of vascular access device: Secondary | ICD-10-CM | POA: Diagnosis not present

## 2024-05-17 DIAGNOSIS — I200 Unstable angina: Secondary | ICD-10-CM | POA: Diagnosis not present

## 2024-05-17 DIAGNOSIS — Z9889 Other specified postprocedural states: Secondary | ICD-10-CM | POA: Diagnosis not present

## 2024-05-17 DIAGNOSIS — E8770 Fluid overload, unspecified: Secondary | ICD-10-CM | POA: Diagnosis present

## 2024-05-17 DIAGNOSIS — E1140 Type 2 diabetes mellitus with diabetic neuropathy, unspecified: Secondary | ICD-10-CM | POA: Diagnosis present

## 2024-05-17 DIAGNOSIS — F419 Anxiety disorder, unspecified: Secondary | ICD-10-CM | POA: Diagnosis present

## 2024-05-17 DIAGNOSIS — E785 Hyperlipidemia, unspecified: Secondary | ICD-10-CM | POA: Diagnosis present

## 2024-05-17 DIAGNOSIS — I2510 Atherosclerotic heart disease of native coronary artery without angina pectoris: Secondary | ICD-10-CM

## 2024-05-17 DIAGNOSIS — J4489 Other specified chronic obstructive pulmonary disease: Secondary | ICD-10-CM | POA: Diagnosis present

## 2024-05-17 DIAGNOSIS — Z79891 Long term (current) use of opiate analgesic: Secondary | ICD-10-CM | POA: Diagnosis not present

## 2024-05-17 DIAGNOSIS — E872 Acidosis, unspecified: Secondary | ICD-10-CM | POA: Diagnosis not present

## 2024-05-17 DIAGNOSIS — G8929 Other chronic pain: Secondary | ICD-10-CM | POA: Diagnosis present

## 2024-05-17 DIAGNOSIS — Z803 Family history of malignant neoplasm of breast: Secondary | ICD-10-CM

## 2024-05-17 DIAGNOSIS — J9601 Acute respiratory failure with hypoxia: Secondary | ICD-10-CM | POA: Diagnosis not present

## 2024-05-17 DIAGNOSIS — J9811 Atelectasis: Secondary | ICD-10-CM | POA: Diagnosis not present

## 2024-05-17 DIAGNOSIS — Z800 Family history of malignant neoplasm of digestive organs: Secondary | ICD-10-CM

## 2024-05-17 DIAGNOSIS — Z91013 Allergy to seafood: Secondary | ICD-10-CM | POA: Diagnosis not present

## 2024-05-17 DIAGNOSIS — Z5982 Transportation insecurity: Secondary | ICD-10-CM | POA: Diagnosis not present

## 2024-05-17 DIAGNOSIS — K219 Gastro-esophageal reflux disease without esophagitis: Secondary | ICD-10-CM | POA: Diagnosis not present

## 2024-05-17 DIAGNOSIS — J9600 Acute respiratory failure, unspecified whether with hypoxia or hypercapnia: Secondary | ICD-10-CM | POA: Diagnosis not present

## 2024-05-17 DIAGNOSIS — Z48812 Encounter for surgical aftercare following surgery on the circulatory system: Secondary | ICD-10-CM | POA: Diagnosis not present

## 2024-05-17 DIAGNOSIS — F5083 Pica in adults: Secondary | ICD-10-CM | POA: Diagnosis not present

## 2024-05-17 DIAGNOSIS — R0989 Other specified symptoms and signs involving the circulatory and respiratory systems: Secondary | ICD-10-CM | POA: Diagnosis not present

## 2024-05-17 DIAGNOSIS — Z7985 Long-term (current) use of injectable non-insulin antidiabetic drugs: Secondary | ICD-10-CM | POA: Diagnosis not present

## 2024-05-17 DIAGNOSIS — M4800 Spinal stenosis, site unspecified: Secondary | ICD-10-CM | POA: Diagnosis not present

## 2024-05-17 DIAGNOSIS — J90 Pleural effusion, not elsewhere classified: Secondary | ICD-10-CM | POA: Diagnosis not present

## 2024-05-17 DIAGNOSIS — D62 Acute posthemorrhagic anemia: Secondary | ICD-10-CM | POA: Diagnosis not present

## 2024-05-17 DIAGNOSIS — E871 Hypo-osmolality and hyponatremia: Secondary | ICD-10-CM | POA: Diagnosis not present

## 2024-05-17 DIAGNOSIS — Z91048 Other nonmedicinal substance allergy status: Secondary | ICD-10-CM

## 2024-05-17 DIAGNOSIS — D649 Anemia, unspecified: Secondary | ICD-10-CM | POA: Diagnosis not present

## 2024-05-17 DIAGNOSIS — Z91040 Latex allergy status: Secondary | ICD-10-CM

## 2024-05-17 DIAGNOSIS — I10 Essential (primary) hypertension: Secondary | ICD-10-CM | POA: Diagnosis present

## 2024-05-17 DIAGNOSIS — M810 Age-related osteoporosis without current pathological fracture: Secondary | ICD-10-CM | POA: Diagnosis not present

## 2024-05-17 DIAGNOSIS — Z7982 Long term (current) use of aspirin: Secondary | ICD-10-CM

## 2024-05-17 DIAGNOSIS — H9190 Unspecified hearing loss, unspecified ear: Secondary | ICD-10-CM | POA: Diagnosis not present

## 2024-05-17 DIAGNOSIS — E8721 Acute metabolic acidosis: Secondary | ICD-10-CM | POA: Diagnosis not present

## 2024-05-17 DIAGNOSIS — Z860100 Personal history of colon polyps, unspecified: Secondary | ICD-10-CM

## 2024-05-17 DIAGNOSIS — R1310 Dysphagia, unspecified: Secondary | ICD-10-CM | POA: Diagnosis not present

## 2024-05-17 DIAGNOSIS — E119 Type 2 diabetes mellitus without complications: Secondary | ICD-10-CM | POA: Diagnosis not present

## 2024-05-17 DIAGNOSIS — N179 Acute kidney failure, unspecified: Secondary | ICD-10-CM | POA: Diagnosis not present

## 2024-05-17 DIAGNOSIS — Z801 Family history of malignant neoplasm of trachea, bronchus and lung: Secondary | ICD-10-CM

## 2024-05-17 DIAGNOSIS — M479 Spondylosis, unspecified: Secondary | ICD-10-CM | POA: Diagnosis not present

## 2024-05-17 DIAGNOSIS — Z7984 Long term (current) use of oral hypoglycemic drugs: Secondary | ICD-10-CM

## 2024-05-17 DIAGNOSIS — S2242XA Multiple fractures of ribs, left side, initial encounter for closed fracture: Secondary | ICD-10-CM | POA: Diagnosis not present

## 2024-05-17 DIAGNOSIS — M1990 Unspecified osteoarthritis, unspecified site: Secondary | ICD-10-CM | POA: Diagnosis not present

## 2024-05-17 DIAGNOSIS — J449 Chronic obstructive pulmonary disease, unspecified: Secondary | ICD-10-CM | POA: Diagnosis not present

## 2024-05-17 DIAGNOSIS — Z83719 Family history of colon polyps, unspecified: Secondary | ICD-10-CM

## 2024-05-17 DIAGNOSIS — E1165 Type 2 diabetes mellitus with hyperglycemia: Secondary | ICD-10-CM | POA: Diagnosis not present

## 2024-05-17 DIAGNOSIS — Z951 Presence of aortocoronary bypass graft: Secondary | ICD-10-CM | POA: Diagnosis not present

## 2024-05-17 DIAGNOSIS — E875 Hyperkalemia: Secondary | ICD-10-CM | POA: Diagnosis not present

## 2024-05-17 DIAGNOSIS — Z87891 Personal history of nicotine dependence: Secondary | ICD-10-CM | POA: Diagnosis not present

## 2024-05-17 DIAGNOSIS — Z4682 Encounter for fitting and adjustment of non-vascular catheter: Secondary | ICD-10-CM | POA: Diagnosis not present

## 2024-05-17 DIAGNOSIS — Z880 Allergy status to penicillin: Secondary | ICD-10-CM

## 2024-05-17 DIAGNOSIS — Z794 Long term (current) use of insulin: Secondary | ICD-10-CM

## 2024-05-17 DIAGNOSIS — E1159 Type 2 diabetes mellitus with other circulatory complications: Secondary | ICD-10-CM | POA: Diagnosis not present

## 2024-05-17 DIAGNOSIS — Z7722 Contact with and (suspected) exposure to environmental tobacco smoke (acute) (chronic): Secondary | ICD-10-CM | POA: Diagnosis present

## 2024-05-17 DIAGNOSIS — Z888 Allergy status to other drugs, medicaments and biological substances status: Secondary | ICD-10-CM

## 2024-05-17 DIAGNOSIS — Z01810 Encounter for preprocedural cardiovascular examination: Secondary | ICD-10-CM | POA: Diagnosis not present

## 2024-05-17 HISTORY — PX: LEFT HEART CATH AND CORONARY ANGIOGRAPHY: CATH118249

## 2024-05-17 LAB — URINALYSIS, ROUTINE W REFLEX MICROSCOPIC
Bilirubin Urine: NEGATIVE
Glucose, UA: NEGATIVE mg/dL
Hgb urine dipstick: NEGATIVE
Ketones, ur: NEGATIVE mg/dL
Leukocytes,Ua: NEGATIVE
Nitrite: NEGATIVE
Protein, ur: NEGATIVE mg/dL
Specific Gravity, Urine: 1.011 (ref 1.005–1.030)
pH: 8 (ref 5.0–8.0)

## 2024-05-17 LAB — GLUCOSE, CAPILLARY
Glucose-Capillary: 95 mg/dL (ref 70–99)
Glucose-Capillary: 121 mg/dL — ABNORMAL HIGH (ref 70–99)
Glucose-Capillary: 112 mg/dL — ABNORMAL HIGH (ref 70–99)

## 2024-05-17 LAB — SURGICAL PCR SCREEN
MRSA, PCR: NEGATIVE
Staphylococcus aureus: NEGATIVE

## 2024-05-17 LAB — HEMOGLOBIN A1C
Hgb A1c MFr Bld: 6.1 % — ABNORMAL HIGH (ref 4.8–5.6)
Mean Plasma Glucose: 128.37 mg/dL

## 2024-05-17 MED ORDER — ASPIRIN 81 MG PO TBEC
81.0000 mg | DELAYED_RELEASE_TABLET | Freq: Every morning | ORAL | Status: DC
  Administered 2024-05-18 – 2024-05-20 (×3): 81 mg via ORAL
  Filled 2024-05-17 (×3): qty 1

## 2024-05-17 MED ORDER — VERAPAMIL HCL 2.5 MG/ML IV SOLN
INTRAVENOUS | Status: DC | PRN
  Administered 2024-05-17: 10 mL via INTRA_ARTERIAL

## 2024-05-17 MED ORDER — FLUTICASONE PROPIONATE 50 MCG/ACT NA SUSP: 1.0000 | Freq: Every day | NASAL | Status: DC | PRN

## 2024-05-17 MED ORDER — GABAPENTIN 300 MG PO CAPS
300.0000 mg | ORAL_CAPSULE | Freq: Two times a day (BID) | ORAL | Status: DC
  Administered 2024-05-17 – 2024-05-20 (×7): 300 mg via ORAL
  Filled 2024-05-17 (×7): qty 1

## 2024-05-17 MED ORDER — ASPIRIN 81 MG PO CHEW: 81.0000 mg | CHEWABLE_TABLET | ORAL | Status: DC

## 2024-05-17 MED ORDER — MUPIROCIN 2 % EX OINT
1.0000 | TOPICAL_OINTMENT | Freq: Two times a day (BID) | CUTANEOUS | Status: AC
  Administered 2024-05-17 – 2024-05-22 (×9): 1 via NASAL
  Filled 2024-05-17 (×3): qty 22

## 2024-05-17 MED ORDER — TIRZEPATIDE 2.5 MG/0.5ML ~~LOC~~ SOAJ: 2.5000 mg | SUBCUTANEOUS | Status: DC

## 2024-05-17 MED ORDER — HEPARIN (PORCINE) 25000 UT/250ML-% IV SOLN
900.0000 [IU]/h | INTRAVENOUS | Status: DC
  Administered 2024-05-17: 800 [IU]/h via INTRAVENOUS
  Administered 2024-05-18 – 2024-05-20 (×2): 900 [IU]/h via INTRAVENOUS
  Filled 2024-05-17 (×4): qty 250

## 2024-05-17 MED ORDER — HEPARIN SODIUM (PORCINE) 1000 UNIT/ML IJ SOLN
INTRAMUSCULAR | Status: AC
  Filled 2024-05-17: qty 10

## 2024-05-17 MED ORDER — ONDANSETRON HCL 4 MG/2ML IJ SOLN: 4.0000 mg | Freq: Four times a day (QID) | INTRAMUSCULAR | Status: DC | PRN

## 2024-05-17 MED ORDER — SODIUM CHLORIDE 0.9% FLUSH: 3.0000 mL | Freq: Two times a day (BID) | INTRAVENOUS | Status: DC

## 2024-05-17 MED ORDER — MIDAZOLAM HCL 2 MG/2ML IJ SOLN
INTRAMUSCULAR | Status: AC
  Filled 2024-05-17: qty 2

## 2024-05-17 MED ORDER — PANTOPRAZOLE SODIUM 20 MG PO TBEC
20.0000 mg | DELAYED_RELEASE_TABLET | Freq: Every day | ORAL | Status: DC
  Administered 2024-05-17 – 2024-05-20 (×4): 20 mg via ORAL
  Filled 2024-05-17 (×4): qty 1

## 2024-05-17 MED ORDER — AMLODIPINE BESYLATE 5 MG PO TABS
5.0000 mg | ORAL_TABLET | Freq: Every evening | ORAL | Status: DC
  Administered 2024-05-17 – 2024-05-20 (×4): 5 mg via ORAL
  Filled 2024-05-17 (×4): qty 1

## 2024-05-17 MED ORDER — SODIUM CHLORIDE 0.9% FLUSH: 3.0000 mL | INTRAVENOUS | Status: DC | PRN

## 2024-05-17 MED ORDER — VERAPAMIL HCL 2.5 MG/ML IV SOLN
INTRAVENOUS | Status: AC
  Filled 2024-05-17: qty 2

## 2024-05-17 MED ORDER — LABETALOL HCL 5 MG/ML IV SOLN: 10.0000 mg | INTRAVENOUS | Status: AC | PRN

## 2024-05-17 MED ORDER — LORATADINE 10 MG PO TABS
10.0000 mg | ORAL_TABLET | Freq: Every day | ORAL | Status: DC
  Administered 2024-05-17 – 2024-05-20 (×4): 10 mg via ORAL
  Filled 2024-05-17 (×4): qty 1

## 2024-05-17 MED ORDER — HEPARIN SODIUM (PORCINE) 1000 UNIT/ML IJ SOLN
INTRAMUSCULAR | Status: DC | PRN
  Administered 2024-05-17: 3500 [IU] via INTRAVENOUS

## 2024-05-17 MED ORDER — IOHEXOL 350 MG/ML SOLN
INTRAVENOUS | Status: DC | PRN
  Administered 2024-05-17: 35 mL

## 2024-05-17 MED ORDER — SODIUM CHLORIDE 0.9 % IV SOLN: 250.0000 mL | INTRAVENOUS | Status: DC | PRN

## 2024-05-17 MED ORDER — SODIUM CHLORIDE 0.9% FLUSH
3.0000 mL | Freq: Two times a day (BID) | INTRAVENOUS | Status: DC
Start: 2024-05-17 — End: 2024-05-21
  Administered 2024-05-17 – 2024-05-20 (×5): 3 mL via INTRAVENOUS

## 2024-05-17 MED ORDER — MIDAZOLAM HCL (PF) 2 MG/2ML IJ SOLN
INTRAMUSCULAR | Status: DC | PRN
  Administered 2024-05-17: 1 mg via INTRAVENOUS

## 2024-05-17 MED ORDER — SODIUM CHLORIDE 0.9 % IV SOLN: 250.0000 mL | INTRAVENOUS | Status: AC | PRN

## 2024-05-17 MED ORDER — ROSUVASTATIN CALCIUM 5 MG PO TABS: 5.0000 mg | ORAL_TABLET | ORAL | Status: DC

## 2024-05-17 MED ORDER — INSULIN ASPART 100 UNIT/ML IJ SOLN
0.0000 [IU] | INTRAMUSCULAR | Status: DC
  Administered 2024-05-18: 2 [IU] via SUBCUTANEOUS
  Administered 2024-05-18: 3 [IU] via SUBCUTANEOUS

## 2024-05-17 MED ORDER — ROSUVASTATIN CALCIUM 5 MG PO TABS
5.0000 mg | ORAL_TABLET | ORAL | Status: DC
  Administered 2024-05-23: 5 mg via ORAL
  Filled 2024-05-17 (×3): qty 1

## 2024-05-17 MED ORDER — HYDRALAZINE HCL 20 MG/ML IJ SOLN: 10.0000 mg | INTRAMUSCULAR | Status: AC | PRN

## 2024-05-17 MED ORDER — INSULIN GLARGINE-YFGN 100 UNIT/ML ~~LOC~~ SOLN
20.0000 [IU] | Freq: Every morning | SUBCUTANEOUS | Status: DC
  Administered 2024-05-18 – 2024-05-20 (×3): 20 [IU] via SUBCUTANEOUS
  Filled 2024-05-17 (×4): qty 0.2

## 2024-05-17 MED ORDER — ACETAMINOPHEN 325 MG PO TABS
650.0000 mg | ORAL_TABLET | ORAL | Status: DC | PRN
  Administered 2024-05-17 – 2024-05-20 (×3): 650 mg via ORAL
  Filled 2024-05-17 (×3): qty 2

## 2024-05-17 MED ORDER — LIDOCAINE HCL (PF) 1 % IJ SOLN
INTRAMUSCULAR | Status: DC | PRN
  Administered 2024-05-17: 2 mL

## 2024-05-17 MED ORDER — FENTANYL CITRATE (PF) 100 MCG/2ML IJ SOLN
INTRAMUSCULAR | Status: DC | PRN
  Administered 2024-05-17: 25 ug via INTRAVENOUS

## 2024-05-17 MED ORDER — FENTANYL CITRATE (PF) 100 MCG/2ML IJ SOLN
INTRAMUSCULAR | Status: AC
  Filled 2024-05-17: qty 2

## 2024-05-17 MED ORDER — FREE WATER: 500.0000 mL | Freq: Once | Status: DC

## 2024-05-17 MED ORDER — VITAMIN D 25 MCG (1000 UNIT) PO TABS
5000.0000 [IU] | ORAL_TABLET | Freq: Every evening | ORAL | Status: DC
  Administered 2024-05-17 – 2024-05-20 (×4): 5000 [IU] via ORAL
  Filled 2024-05-17 (×4): qty 5

## 2024-05-17 MED ORDER — LOSARTAN POTASSIUM 50 MG PO TABS
100.0000 mg | ORAL_TABLET | Freq: Every morning | ORAL | Status: DC
Start: 2024-05-18 — End: 2024-05-20
  Administered 2024-05-18 – 2024-05-20 (×3): 100 mg via ORAL
  Filled 2024-05-17 (×3): qty 2

## 2024-05-17 MED ORDER — ALBUTEROL SULFATE (2.5 MG/3ML) 0.083% IN NEBU: 2.5000 mg | INHALATION_SOLUTION | Freq: Four times a day (QID) | RESPIRATORY_TRACT | Status: DC | PRN

## 2024-05-17 MED ORDER — HEPARIN (PORCINE) IN NACL 1000-0.9 UT/500ML-% IV SOLN
INTRAVENOUS | Status: DC | PRN
  Administered 2024-05-17 (×2): 500 mL

## 2024-05-17 NOTE — Interval H&P Note (Signed)
 History and Physical Interval Note:  05/17/2024 9:25 AM  Natalie Anderson  has presented today for surgery, with the diagnosis of c/p angiona - Abnormal Myoview.  The various methods of treatment have been discussed with the patient and family. After consideration of risks, benefits and other options for treatment, the patient has consented to  Procedure(s): LEFT HEART CATH AND CORONARY ANGIOGRAPHY (N/A)  PERCUTANEOUS CORONARY INTERVENTION  as a surgical intervention.  The patient's history has been reviewed, patient examined, no change in status, stable for surgery.  I have reviewed the patient's chart and labs.  Questions were answered to the patient's satisfaction.    Cath Lab Visit (complete for each Cath Lab visit)  Clinical Evaluation Leading to the Procedure:   ACS: No.  Non-ACS:    Anginal Classification: CCS III  Anti-ischemic medical therapy: Minimal Therapy (1 class of medications)  Non-Invasive Test Results: High-risk stress test findings: cardiac mortality >3%/year  Prior CABG: No previous CABG     Alm Clay

## 2024-05-17 NOTE — Progress Notes (Signed)
 PHARMACY - ANTICOAGULATION CONSULT NOTE  Pharmacy Consult for heparin  Indication: chest pain/ACS  Allergies  Allergen Reactions   Latex Rash    Blisters    Morphine  And Codeine Itching and Nausea And Vomiting   Other Hives and Rash    Strawberries and oranges    Amoxicillin Other (See Comments)    Caused mouth sores, and Gastric issues    Prednisone     Makes me bleed out of my mouth   Nickel Rash   Penicillins Hives    Patient Measurements: Height: 5' 2.5 (158.8 cm) Weight: 64.4 kg (142 lb) IBW/kg (Calculated) : 51.25 HEPARIN  DW (KG): 64.2  Vital Signs: BP: 133/67 (10/24 1500) Pulse Rate: 97 (10/24 1500)  Labs: No results for input(s): HGB, HCT, PLT, APTT, LABPROT, INR, HEPARINUNFRC, HEPRLOWMOCWT, CREATININE, CKTOTAL, CKMB, TROPONINIHS in the last 72 hours.  CrCl cannot be calculated (Patient's most recent lab result is older than the maximum 21 days allowed.).   Medical History: Past Medical History:  Diagnosis Date   Arthritis    Asthma    as a child   Blood transfusion 07/25/1977   Cataract immature    bilateral   Chronic back pain    spondylosis and stenosis   Complication of anesthesia    stopped breathing after anesthesia   COPD (chronic obstructive pulmonary disease) (HCC)    Diabetes mellitus    takes Metformin  bid and Levimir bid and Victoza daily   Dizziness    occasionally   Dry skin    and itchy    Fibroid tumor    GERD (gastroesophageal reflux disease)    takes Protonix  daily   History of shingles 15-79yrs ago   Hyperlipidemia    takes Lipitor occasionally   Hypertension    takes Losartan daily   Impaired hearing    Joint pain    Joint swelling    Migraine    last one 3 wks ago   Nocturia    Osteoporosis    Osteoporosis    Pneumonia    hx of-double as a child   PONV (postoperative nausea and vomiting)    Seasonal allergies    takes Claritin  daily   Shortness of breath    with exertion   Urinary  frequency    UTI (lower urinary tract infection)    hx of   Vertigo    hx of   Vitamin D deficiency    takes Vit D 50,000units on Sat     Assessment: 61 yoF admitted with positive stress test for cath noted to have mvCAD. Pharmacy to start IV heparin  2h after TR band removal (removed ~1520). No AC PTA.   Goal of Therapy:  Heparin  level 0.3-0.7 units/ml Monitor platelets by anticoagulation protocol: Yes   Plan:  Heparin  800 units/h at 1730 Check heparin  level 8h   Ozell Jamaica, PharmD, [place], [facility] Clinical Pharmacist [phone] Please check AMION for all [facility] Pharmacy numbers 05/17/2024

## 2024-05-17 NOTE — Brief Op Note (Signed)
   BRIEF CARDIAC CATHETERIZATION REPORT   ID: [id number]    MRN: [id number] 05/17/2024 10:19 AM  SURGEON:  Surgeons and Role:    * Anner Alm ORN, MD - Primary  PROCEDURE:  Procedure(s): LEFT HEART CATH AND CORONARY ANGIOGRAPHY (N/A)  PATIENT:  Natalie Anderson  78 y.o. female with HTN, HLD and DM-2 referred by Dr. Dorn Ross for cardiac catheterization.  She is having chest pain with minimal activity that limits her day-to-day activity.  She was evaluated with an echocardiogram that was relatively normal but a nuclear stress test was read a large sized anterior ischemic defect.  PRE-OPERATIVE DIAGNOSIS: Class III-IV Progressive Angina  POST-OPERATIVE DIAGNOSIS:   Severe two-vessel disease: 30% Left Main with 99% calcified ostial LAD stenosis followed by a focal eccentric 95% calcified stenosis in the proximal segment and 70% eccentric lesion at the takeoff of a small D1.  The LAD then bifurcates into a large septal perforator and in the distal LAD with that there is TIMI II flow distally LCx is relatively free of disease after the ostium there is a diminutive OM1 and mid vessel terminates as a lateral OM 2 with an AV groove branch giving off small LPL 1 RCA is heavily calcified proximal segment with ostial 60% stenosis (catheter damping) followed by a focal 70% stenosis and then a long segment of diffuse 20 to 50% stenosis.  The remainder of the RCA is large caliber and free of disease it bifurcates into a large PDA and PAVB with 1 major PLV branch. Normal LVEDP   PROCEDURE PERFORMED  Time Out: Verified patient identification, verified procedure, site/side was marked, verified correct patient position, special equipment/implants available, medications/allergies/relevent history reviewed, required imaging and test results available. Performed.  Access:  Right Radial Artery: 6 Fr sheath -- Seldinger technique using Micropuncture Kit -- Direct ultrasound guidance used.  Permanent  image obtained and placed on chart. -- 10 mL radial cocktail IA; 3500 units IV Heparin    Left Heart Catheterization: 5 Fr Catheters advanced or exchanged over a J-wire under direct fluoroscopic guidance into the ascending aorta; take 4.0 catheter advanced first.  * LV Hemodynamics (no LV Gram): TIG 4.0 catheter * Left Coronary Artery Cineangiography: TIG 4.0 and JL 3.5 catheter  * Right Coronary Artery Cineangiography: TIG 4.0 catheter   Review of initial angiography revealed: Severe two-vessel disease as noted above  Preparations are made for inpatient admission for progressive angina with CVTS consultation  Upon completion of Angiogaphy, the catheter was removed completely out of the body over a wire, without complication.  Radial sheath removed in the Cardiac Catheterization lab with TR Band placed for hemostasis.  TR Band: 1005 Hours; 11 mL air  MEDICATIONS SQ Lidocaine  3 mL Radial Cocktail: 3 mg Verapmil in 10 mL NS Heparin : 3500 units  ANESTHESIA:   IV sedation 1 mg Versed , 25 mcg Fentanyl   EBL:  <50 mL  COUNTS:  YES  PATIENT DISPOSITION:  PACU - hemodynamically stable.  Stable before during and after procedure.  DICTATION: .Note written in EPIC  PLAN OF CARE: Admit to inpatient  -> start heparin  2 hours after TR band removal.  CVTS consultation.  Continue home medications.  Hold metformin  and use sliding scale insulin     Alm Anner, MD

## 2024-05-17 NOTE — Progress Notes (Addendum)
 [address]       [place] [address]             [phone]        Natalie Anderson [facility] Medical Record #[id number] Date of Birth: [birth year]  Referring: Natalie Clay, MD Primary Care: Natalie Isles, MD Primary Cardiologist:Natalie Anderson, Dorn, MD  Reason for Consult: Evaluation for potential coronary bypass grafting for multivessel coronary artery disease.   History of Present Illness: [name] is a 78 year old female with a past history notable for hypertension, dyslipidemia, type 2 diabetes mellitus, chronic back pain, gastroesophageal reflux disease.  She is a lifelong non-smoker but was exposed to second hand smoke in her home for 50 years and said she has COPD as a result.  She was recently evaluated by her PCP, Dr. Loreli, for exertional chest pain with shortness of breath ongoing for 4-5 weeks.  Outpatient evaluation included an echocardiogram showing an ejection fraction of greater than 70% with no wall motion abnormality.  There was grade 1 diastolic dysfunction but no valvular pathology.  She went on to have a nuclear stress test showing a large partially reversible anterior defect.  This prompted referral to Dr. Alvan, cardiologist, for further evaluation.  He recommended left heart catheterization which was performed earlier today demonstrating proximal stenoses in both the RCA and LAD as detailed below.  [name] describes having worsening chest pain on a daily basis that occurs now with minimal exertion such as just walking between 2 rooms or bending down to pick up something off the floor.  [name] is widowed but has continued to live alone and care for herself for the past 15 years since her husband died.  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  She states she had good family support and would have assistance after she  leaves the hospital.  Past surgical history is notable for 2 back surgeries for herniated disc with residual chronic weakness in her right lower extremity.  She also has an esophageal stricture that has been dilated on the occasions and she said it was due to be done again in the near future.    Current Activity/ Functional Status: Patient is independent with mobility/ambulation, transfers, ADL's, IADL's.   Zubrod Score: At the time of surgery this patient's most appropriate activity status/level should be described as: []     0    Normal activity, no symptoms []     1    Restricted in physical strenuous activity but ambulatory, able to do out light work []     2    Ambulatory and capable of self care, unable to do work activities, up and about                 more than 50%  Of the time                            [x]     3    Only limited self care, in bed greater than 50% of waking hours []     4    Completely disabled, no self care, confined to bed or chair []     5    Moribund  Past Medical History:  Diagnosis Date   Arthritis    Asthma    as a child  Blood transfusion 07/25/1977   Cataract immature    bilateral   Chronic back pain    spondylosis and stenosis   Complication of anesthesia    stopped breathing after anesthesia   COPD (chronic obstructive pulmonary disease) (HCC)    Diabetes mellitus    takes Metformin  bid and Levimir bid and Victoza daily   Dizziness    occasionally   Dry skin    and itchy    Fibroid tumor    GERD (gastroesophageal reflux disease)    takes Protonix  daily   History of shingles 15-46yrs ago   Hyperlipidemia    takes Lipitor occasionally   Hypertension    takes Losartan daily   Impaired hearing    Joint pain    Joint swelling    Migraine    last one 3 wks ago   Nocturia    Osteoporosis    Osteoporosis    Pneumonia    hx of-double as a child   PONV (postoperative nausea and vomiting)    Seasonal allergies    takes Claritin  daily    Shortness of breath    with exertion   Urinary frequency    UTI (lower urinary tract infection)    hx of   Vertigo    hx of   Vitamin D deficiency    takes Vit D 50,000units on Sat    Past Surgical History:  Procedure Laterality Date   BACK SURGERY  12/24/2010   x2   CATARACT EXTRACTION W/PHACO Right 04/05/2021   Procedure: CATARACT EXTRACTION PHACO AND INTRAOCULAR LENS PLACEMENT (IOC);  Surgeon: Harrie Agent, MD;  Location: [facility];  Service: Ophthalmology;  Laterality: Right;  CDE 5.98   CATARACT EXTRACTION W/PHACO Left 04/19/2021   Procedure: CATARACT EXTRACTION PHACO AND INTRAOCULAR LENS PLACEMENT LEFT EYE;  Surgeon: Harrie Agent, MD;  Location: [facility];  Service: Ophthalmology;  Laterality: Left;  left CDE=5.90   COLONOSCOPY WITH PROPOFOL  N/A 12/09/2021   Surgeon: Shaaron Lamar HERO, MD; Six 3 to 9 mm polyps removed and 1 hemostasis clip placed.  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  Recommended repeat in 3 years.   DILATION AND CURETTAGE OF UTERUS  07/25/1977   ESOPHAGOGASTRODUODENOSCOPY (EGD) WITH PROPOFOL  N/A 12/09/2021   Surgeon: Shaaron Lamar HERO, MD; Moderate Schatzki ring. Dilated. Small hiatal hernia.   HEMOSTASIS CLIP PLACEMENT  12/09/2021   Procedure: HEMOSTASIS CLIP PLACEMENT;  Surgeon: Shaaron Lamar HERO, MD;  Location: [facility];  Service: Endoscopy;;   MALONEY DILATION N/A 12/09/2021   Procedure: AGAPITO HODGKIN;  Surgeon: Shaaron Lamar HERO, MD;  Location: [facility];  Service: Endoscopy;  Laterality: N/A;   POLYPECTOMY  12/09/2021   Procedure: POLYPECTOMY;  Surgeon: Shaaron Lamar HERO, MD;  Location: [facility];  Service: Endoscopy;;    Social History   Tobacco Use  Smoking Status Never  Smokeless Tobacco Never    Social History   Substance and Sexual Activity  Alcohol  Use No     Allergies  Allergen Reactions   Latex Rash    Blisters    Morphine  And Codeine Itching and Nausea And Vomiting   Other Hives and Rash     Strawberries and oranges    Amoxicillin Other (See Comments)    Caused mouth sores, and Gastric issues    Prednisone     Makes me bleed out of my mouth   Nickel Rash   Penicillins Hives    Current Facility-Administered Medications  Medication Dose Route  Frequency Provider Last Rate Last Admin   0.9 %  sodium chloride  infusion  250 mL Intravenous PRN Alvan Natalie Anderson FALCON, MD       0.9 %  sodium chloride  infusion  250 mL Intravenous PRN Natalie Natalie ORN, MD       acetaminophen  (TYLENOL ) tablet 650 mg  650 mg Oral Q4H PRN Natalie Natalie ORN, MD       aspirin chewable tablet 81 mg  81 mg Oral Pre-Cath Natalie Anderson, Natalie Anderson FALCON, MD       fentaNYL  (SUBLIMAZE ) injection    PRN Natalie Natalie ORN, MD   25 mcg at 05/17/24 9070   free water  500 mL  500 mL Oral Once Alvan Natalie Anderson FALCON, MD       free water  500 mL  500 mL Oral Once Natalie Natalie ORN, MD       Heparin  (Porcine) in NaCl 1000-0.9 UT/500ML-% SOLN    PRN Natalie Natalie ORN, MD   500 mL at 05/17/24 9073   heparin  sodium (porcine) injection    PRN Natalie Natalie ORN, MD   3,500 Units at 05/17/24 0946   hydrALAZINE (APRESOLINE) injection 10 mg  10 mg Intravenous Q20 Min PRN Natalie Natalie ORN, MD       iohexol  (OMNIPAQUE ) 350 MG/ML injection    PRN Natalie Natalie ORN, MD   35 mL at 05/17/24 0959   labetalol  (NORMODYNE ) injection 10 mg  10 mg Intravenous Q10 min PRN Natalie Natalie ORN, MD       lidocaine  (PF) (XYLOCAINE ) 1 % injection    PRN Natalie Natalie ORN, MD   2 mL at 05/17/24 0941   midazolam  PF (VERSED ) injection    PRN Natalie Natalie ORN, MD   1 mg at 05/17/24 9070   ondansetron  (ZOFRAN ) injection 4 mg  4 mg Intravenous Q6H PRN Natalie Natalie ORN, MD       Radial Cocktail/Verapamil only    PRN Natalie Natalie ORN, MD   10 mL at 05/17/24 0942   sodium chloride  flush (NS) 0.9 % injection 3 mL  3 mL Intravenous Q12H Alvan Natalie Anderson FALCON, MD       sodium chloride  flush (NS) 0.9 % injection 3 mL  3 mL Intravenous PRN Natalie Anderson, Natalie F, MD        Medications Prior  to Admission  Medication Sig Dispense Refill Last Dose/Taking   albuterol (PROVENTIL HFA;VENTOLIN HFA) 108 (90 BASE) MCG/ACT inhaler Inhale 2 puffs into the lungs every 6 (six) hours as needed for wheezing.   05/16/2024   amLODipine (NORVASC) 5 MG tablet Take 5 mg by mouth every evening.   05/16/2024   aspirin EC 81 MG tablet Take 81 mg by mouth in the morning. Swallow whole.   05/17/2024 at  8:00 AM   gabapentin  (NEURONTIN ) 300 MG capsule Take 600 mg by mouth 3 (three) times daily. (Patient taking differently: Take 300 mg by mouth 2 (two) times daily.)   05/17/2024 Morning   loratadine  (CLARITIN ) 10 MG tablet Take 10 mg by mouth daily.   05/16/2024   losartan (COZAAR) 100 MG tablet Take 100 mg by mouth in the morning.   05/16/2024 Morning   metFORMIN  (GLUCOPHAGE ) 1000 MG tablet Take 1,000 mg by mouth 2 (two) times daily with a meal.   05/16/2024   pantoprazole  (PROTONIX ) 20 MG tablet Take 1 tablet (20 mg total) by mouth daily. 30 tablet 11 05/16/2024   rosuvastatin (CRESTOR) 5 MG tablet Take 5 mg by mouth once a week.  05/16/2024   TRESIBA FLEXTOUCH 200 UNIT/ML FlexTouch Pen Inject 40 Units into the skin in the morning.   05/16/2024   Cholecalciferol (VITAMIN D-3) 125 MCG (5000 UT) TABS Take 5,000 Units by mouth every evening.      fluticasone  (FLONASE) 50 MCG/ACT nasal spray Place 1 spray into both nostrils daily.      MOUNJARO 2.5 MG/0.5ML Pen Inject 2.5 mg into the skin once a week.   05/10/2024    Family History  Problem Relation Age of Onset   Breast cancer Mother    Lung cancer Mother    Colon polyps Sister    Colon cancer Paternal Grandmother        50   Colon cancer Cousin 19   Colon cancer Cousin 19   Colon cancer Cousin 21   Lung cancer Brother    Colon cancer Nephew 35   Colon cancer Niece 71   Anesthesia problems Neg Hx    Hypotension Neg Hx    Malignant hyperthermia Neg Hx    Pseudochol deficiency Neg Hx      Review of Systems:      Cardiac Review of Systems: Y  or  [    ]= no  Chest Pain [ x]  Resting SOB [   ] Exertional SOB  [ x ]  Orthopnea [  ]   Pedal Edema [   ]    Palpitations [  ] Syncope  [  ]   Presyncope [   ]  General Review of Systems: [Y] = yes [  ]=no Constitional: recent weight change [  ]; anorexia [  ]; fatigue [  ]; nausea [  ]; night sweats [  ]; fever [  ]; or chills [  ]                                                               Dental: Last Dentist visit: full dentures  Eye : blurred vision [  ]; diplopia [   ]; vision changes [  ];  Amaurosis fugax[  ]; Resp: cough [  ];  wheezing[  ];  hemoptysis[  ]; shortness of breath[  ]; paroxysmal nocturnal dyspnea[  ]; dyspnea on exertion[  ]; or orthopnea[  ];  GI:  gallstones[  ], vomiting[  ];  dysphagia[  ]; melena[  ];  hematochezia [  ]; heartburn[  ];   Hx of  Colonoscopy[  ]; GU: kidney stones [  ]; hematuria[  ];   dysuria [  ];  nocturia[  ];  history of     obstruction [  ]; urinary frequency [  ]             Skin: rash, swelling[  ];, hair loss[  ];  peripheral edema[  ];  or itching[  ]; Musculosketetal: myalgias[  ];  joint swelling[  ];  joint erythema[  ];  joint pain[  ];  back pain[  ];  Heme/Lymph: bruising[  ];  bleeding[  ];  anemia[  ];  Neuro: TIA[  ];  headaches[  ];  stroke[  ];  vertigo[  ];  seizures[  ];   paresthesias[  ];  difficulty walking[  ];  Psych:depression[  ]; anxiety[  ];  Endocrine: diabetes[  ];  thyroid  dysfunction[  ];          Physical Exam: BP 118/60   Pulse 87   Resp 17   Ht 5' 2.5 (1.588 m)   Wt 64.4 kg   SpO2 91%   BMI 25.56 kg/m    General appearance: alert, cooperative, and no distress Head: Normocephalic, without obvious abnormality, atraumatic Neck: no adenopathy, no carotid bruit, no JVD, and supple, symmetrical, trachea midline Lymph nodes: No cervical or clavicular adenopathy Resp: Breath sounds are full, equal, and clear to auscultation Cardio: Regular rhythm, mild tachycardia with a heart rate of  100-105 on  monitor.  I did not hear a murmur. GI: Soft, no tenderness. Extremities: No deformities.  Pedal pulses are easily palpable.  She has superficial varicosities of both lower extremities.  There is no peripheral edema.  I am unable to palpate pedal pulses. Neurologic: Grossly normal  Diagnostic Studies & Laboratory data:  BRIEF CARDIAC CATHETERIZATION REPORT     ID: Natalie Anderson                                 MRN: [id number] 05/17/2024 10:19 AM   SURGEON:  Surgeons and Role:    * Natalie Natalie ORN, MD - Primary   PROCEDURE:  Procedure(s): LEFT HEART CATH AND CORONARY ANGIOGRAPHY (N/A)   PATIENT:  Natalie Anderson  78 y.o. female with HTN, HLD and DM-2 referred by Dr. Dorn Anderson for cardiac catheterization.  She is having chest pain with minimal activity that limits her day-to-day activity.  She was evaluated with an echocardiogram that was relatively normal but a nuclear stress test was read a large sized anterior ischemic defect.   PRE-OPERATIVE DIAGNOSIS: Class III-IV Progressive Angina   POST-OPERATIVE DIAGNOSIS:   Severe two-vessel disease: 30% Left Main with 99% calcified ostial LAD stenosis followed by a focal eccentric 95% calcified stenosis in the proximal segment and 70% eccentric lesion at the takeoff of a small D1.  The LAD then bifurcates into a large septal perforator and in the distal LAD with that there is TIMI II flow distally LCx is relatively free of disease after the ostium there is a diminutive OM1 and mid vessel terminates as a lateral OM 2 with an AV groove Natalie Anderson giving off small LPL 1 RCA is heavily calcified proximal segment with ostial 60% stenosis (catheter damping) followed by a focal 70% stenosis and then a long segment of diffuse 20 to 50% stenosis.  The remainder of the RCA is large caliber and free of disease it bifurcates into a large PDA and PAVB with 1 major PLV Natalie Anderson. Normal LVEDP     PROCEDURE PERFORMED  Time Out: Verified patient identification,  verified procedure, site/side was marked, verified correct patient position, special equipment/implants available, medications/allergies/relevent history reviewed, required imaging and test results available. Performed.   Access:  Right Radial Artery: 6 Fr sheath -- Seldinger technique using Micropuncture Kit -- Direct ultrasound guidance used.  Permanent image obtained and placed on chart. -- 10 mL radial cocktail IA; 3500 units IV Heparin      Left Heart Catheterization: 5 Fr Catheters advanced or exchanged over a J-wire under direct fluoroscopic guidance into the ascending aorta; take 4.0 catheter advanced first.  * LV Hemodynamics (no LV Gram): TIG 4.0 catheter * Left Coronary Artery Cineangiography: TIG 4.0 and JL 3.5 catheter  * Right Coronary Artery Cineangiography: TIG 4.0 catheter  Review of initial angiography revealed: Severe two-vessel disease as noted above   Preparations are made for inpatient admission for progressive angina with CVTS consultation   Upon completion of Angiogaphy, the catheter was removed completely out of the body over a wire, without complication.   Radial sheath removed in the Cardiac Catheterization lab with TR Band placed for hemostasis.   TR Band: 1005 Hours; 11 mL air   MEDICATIONS SQ Lidocaine  3 mL Radial Cocktail: 3 mg Verapmil in 10 mL NS Heparin : 3500 units   ANESTHESIA:   IV sedation 1 mg Versed , 25 mcg Fentanyl    EBL:  <50 mL   COUNTS:  YES   PATIENT DISPOSITION:  PACU - hemodynamically stable.  Stable before during and after procedure.   DICTATION: .Note written in EPIC   PLAN OF CARE: Admit to inpatient  -> start heparin  2 hours after TR band removal.  CVTS consultation.  Continue home medications.  Hold metformin  and use sliding scale insulin        Natalie Clay, MD       Recent Radiology Findings:   No results found.   I have independently reviewed the above radiologic studies and discussed with the patient   Recent  Lab Findings: Lab Results  Component Value Date   WBC 7.1 10/28/2011   HGB 13.8 10/28/2011   HCT 40.6 10/28/2011   PLT 292 10/28/2011   GLUCOSE 131 (H) 12/07/2021   NA 137 12/07/2021   K 3.9 12/07/2021   CL 105 12/07/2021   CREATININE 0.84 12/07/2021   BUN 9 12/07/2021   CO2 24 12/07/2021      Assessment / Plan:      - Two-vessel coronary artery disease presenting with unstable angina and preserved LV function.  Left heart catheterization demonstrates a 30% left main coronary stenosis with a 99% calcified ostial LAD stenosis followed by a focal eccentric 95% calcified stenosis there was no significant stenosis in the circumflex system.  The RCA has heavily calcified proximal segment with an ostial 60% stenosis creating catheter dampening during the procedure followed by a 70% focal proximal stenosis in the RCA.  We agree that coronary bypass grafting is her best option for revascularization based on her coronary anatomy.  -Type 2 diabetes mellitus: Managed with [place], metformin , and Mounjaro in the outpatient setting.  Hemoglobin A1c is pending.  -Hypertension: Managed with amlodipine and losartan  -Dyslipidemia, on rosuvastatin 5 mg daily prior to admission  -Suspected history of COPD due to longterm secondhand smoke  I  spent 25 minutes counseling the patient face to face.   Natalie JUDITHANN Becket, PA-C  05/17/2024 12:14 PM   Agree 78yo female with 2V CAD.  Echocardiogram shows preserved biventricular function and no significant valvular disease.  Will plan for off-pump CABG 2.  Natalie Anderson Natalie Anderson

## 2024-05-17 NOTE — Progress Notes (Signed)
 TR BAND REMOVAL  LOCATION:   right radial arterial site   DEFLATED PER PROTOCOL:   yes  TIME BAND OFF / DRESSING APPLIED:    1520/gauze and tegaderm  SITE UPON ARRIVAL:    Level 0  SITE AFTER BAND REMOVAL:    Level 0  CIRCULATION SENSATION AND MOVEMENT:    Within Normal Limits : yes, right radial site level 0, 2+ palpable radial, hand and fingers warm and pink, good pleth waveform  COMMENTS:

## 2024-05-17 NOTE — Plan of Care (Signed)
  Problem: Education: Goal: Understanding of CV disease, CV risk reduction, and recovery process will improve Outcome: Progressing Goal: Individualized Educational Video(s) Outcome: Progressing   Problem: Activity: Goal: Ability to return to baseline activity level will improve Outcome: Progressing   Problem: Cardiovascular: Goal: Ability to achieve and maintain adequate cardiovascular perfusion will improve Outcome: Progressing Goal: Vascular access site(s) Level 0-1 will be maintained Outcome: Progressing   Problem: Health Behavior/Discharge Planning: Goal: Ability to safely manage health-related needs after discharge will improve Outcome: Progressing   Problem: Education: Goal: Knowledge of General Education information will improve Description: Including pain rating scale, medication(s)/side effects and non-pharmacologic comfort measures Outcome: Progressing   Problem: Health Behavior/Discharge Planning: Goal: Ability to manage health-related needs will improve Outcome: Progressing   Problem: Clinical Measurements: Goal: Ability to maintain clinical measurements within normal limits will improve Outcome: Progressing Goal: Respiratory complications will improve Outcome: Progressing   Problem: Activity: Goal: Risk for activity intolerance will decrease Outcome: Progressing   Problem: Nutrition: Goal: Adequate nutrition will be maintained Outcome: Progressing   Problem: Coping: Goal: Level of anxiety will decrease Outcome: Progressing   Problem: Elimination: Goal: Will not experience complications related to urinary retention Outcome: Progressing   Problem: Pain Managment: Goal: General experience of comfort will improve and/or be controlled Outcome: Progressing   Problem: Skin Integrity: Goal: Risk for impaired skin integrity will decrease Outcome: Progressing

## 2024-05-18 ENCOUNTER — Encounter (HOSPITAL_COMMUNITY): Payer: Self-pay | Admitting: Cardiology

## 2024-05-18 DIAGNOSIS — I200 Unstable angina: Secondary | ICD-10-CM | POA: Diagnosis not present

## 2024-05-18 LAB — CBC
HCT: 32.7 % — ABNORMAL LOW (ref 36.0–46.0)
Hemoglobin: 10.8 g/dL — ABNORMAL LOW (ref 12.0–15.0)
MCH: 26.3 pg (ref 26.0–34.0)
MCHC: 33 g/dL (ref 30.0–36.0)
MCV: 79.6 fL — ABNORMAL LOW (ref 80.0–100.0)
Platelets: 361 K/uL (ref 150–400)
RBC: 4.11 MIL/uL (ref 3.87–5.11)
RDW: 13.4 % (ref 11.5–15.5)
WBC: 5.8 K/uL (ref 4.0–10.5)
nRBC: 0 % (ref 0.0–0.2)

## 2024-05-18 LAB — GLUCOSE, CAPILLARY
Glucose-Capillary: 113 mg/dL — ABNORMAL HIGH (ref 70–99)
Glucose-Capillary: 126 mg/dL — ABNORMAL HIGH (ref 70–99)
Glucose-Capillary: 153 mg/dL — ABNORMAL HIGH (ref 70–99)
Glucose-Capillary: 120 mg/dL — ABNORMAL HIGH (ref 70–99)
Glucose-Capillary: 122 mg/dL — ABNORMAL HIGH (ref 70–99)

## 2024-05-18 LAB — HEPARIN LEVEL (UNFRACTIONATED)
Heparin Unfractionated: 0.25 [IU]/mL — ABNORMAL LOW (ref 0.30–0.70)
Heparin Unfractionated: 0.32 [IU]/mL (ref 0.30–0.70)

## 2024-05-18 MED ORDER — INSULIN ASPART 100 UNIT/ML IJ SOLN: 0.0000 [IU] | Freq: Three times a day (TID) | INTRAMUSCULAR | Status: DC

## 2024-05-18 MED ORDER — MELATONIN 3 MG PO TABS
3.0000 mg | ORAL_TABLET | Freq: Every day | ORAL | Status: DC
  Administered 2024-05-18 – 2024-05-20 (×4): 3 mg via ORAL
  Filled 2024-05-18 (×4): qty 1

## 2024-05-18 MED ORDER — INSULIN ASPART 100 UNIT/ML IJ SOLN
0.0000 [IU] | Freq: Three times a day (TID) | INTRAMUSCULAR | Status: DC
  Administered 2024-05-19: 3 [IU] via SUBCUTANEOUS
  Administered 2024-05-19: 2 [IU] via SUBCUTANEOUS
  Administered 2024-05-19: 3 [IU] via SUBCUTANEOUS
  Administered 2024-05-20: 5 [IU] via SUBCUTANEOUS
  Administered 2024-05-20: 8 [IU] via SUBCUTANEOUS
  Administered 2024-05-21: 3 [IU] via SUBCUTANEOUS

## 2024-05-18 NOTE — Progress Notes (Signed)
 PHARMACY - ANTICOAGULATION CONSULT NOTE  Pharmacy Consult for heparin  Indication: chest pain/ACS  Allergies  Allergen Reactions   Latex Rash    Blisters    Morphine  And Codeine Itching and Nausea And Vomiting   Other Hives and Rash    Strawberries and oranges    Amoxicillin Other (See Comments)    Caused mouth sores, and Gastric issues    Prednisone     Makes me bleed out of my mouth   Nickel Rash   Penicillins Hives    Patient Measurements: Height: 5' 2.5 (158.8 cm) Weight: 64.4 kg (142 lb) IBW/kg (Calculated) : 51.25 HEPARIN  DW (KG): 64.2  Vital Signs: Temp: 97.9 F (36.6 C) (10/25 0507) Temp Source: Oral (10/25 0507) BP: 146/69 (10/25 0507) Pulse Rate: 95 (10/25 0507)  Labs: Recent Labs    05/18/24 0545  HGB 10.8*  HCT 32.7*  PLT 361  HEPARINUNFRC 0.25*    CrCl cannot be calculated (Patient's most recent lab result is older than the maximum 21 days allowed.).  Assessment: 54 yoF admitted with positive stress test for cath noted to have mvCAD. Pharmacy to start IV heparin  2h after TR band removal (removed ~1520). No AC PTA.   AM: heparin  level slightly below goal on 800 units/hr. Per RN, no issues with heparin  running continuously or signs/symptoms of bleeding.  Goal of Therapy:  Heparin  level 0.3-0.7 units/ml Monitor platelets by anticoagulation protocol: Yes   Plan:  Heparin  900 units/hr Check heparin  level 8h  Plans for CABG  Lynwood Poplar, PharmD, BCPS Clinical Pharmacist 05/18/2024 6:47 AM

## 2024-05-18 NOTE — Progress Notes (Signed)
  Progress Note  Patient Name: Natalie Anderson Date of Encounter: 05/18/2024 [facility] Cardiologist: Alvan Carrier, MD   Interval Summary   No acute overnight events. Patient reports feeling relatively well. No new or acute complaints.    Vital Signs Vitals:   05/17/24 1622 05/17/24 2054 05/18/24 0026 05/18/24 0507  BP: (!) 158/91 (!) 143/69 131/70 (!) 146/69  Pulse: 100 86 76 95  Resp:  17 18 18   Temp:  97.8 F (36.6 C) 98 F (36.7 C) 97.9 F (36.6 C)  TempSrc:  Oral Oral Oral  SpO2: 99% 98% 99% 98%  Weight:      Height:        Intake/Output Summary (Last 24 hours) at 05/18/2024 0848 Last data filed at 05/18/2024 9286 Gross per 24 hour  Intake 220.28 ml  Output 650 ml  Net -429.72 ml      05/17/2024    4:07 PM 05/17/2024    8:56 AM 05/13/2024   11:16 AM  Last 3 Weights  Weight (lbs) 142 lb 142 lb 142 lb 3.2 oz  Weight (kg) 64.411 kg 64.411 kg 64.501 kg      Telemetry/ECG  SR, burst of AT, no VAs - Personally Reviewed  Physical Exam  General: Well developed, in no acute distress.  Neck: No JVD.  Cardiac: Normal rate, regular rhythm.  Resp: Normal work of breathing.  Ext: No edema.  Neuro: No gross focal deficits.  Psych: Normal affect.   LHC 10/24:  POST-OPERATIVE DIAGNOSIS:   Severe two-vessel disease: 30% Left Main with 99% calcified ostial LAD stenosis followed by a focal eccentric 95% calcified stenosis in the proximal segment and 70% eccentric lesion at the takeoff of a small D1.  The LAD then bifurcates into a large septal perforator and in the distal LAD with that there is TIMI II flow distally LCx is relatively free of disease after the ostium there is a diminutive OM1 and mid vessel terminates as a lateral OM 2 with an AV groove branch giving off small LPL 1 RCA is heavily calcified proximal segment with ostial 60% stenosis (catheter damping) followed by a focal 70% stenosis and then a long segment of diffuse 20 to 50% stenosis.  The  remainder of the RCA is large caliber and free of disease it bifurcates into a large PDA and PAVB with 1 major PLV branch. Normal LVEDP  Assessment & Plan  Natalie Anderson  78 y.o. female with HTN, HLD and DM-2 who underwent LHC on 10/24 for progressive angina. She was found to have severe 2-vessel disease with 99% calcified LAD stenosis and 70% stenosis of the RCA. She was admitted post [facility] for CABG evaluation.  Problem List:  Severe 2 vessel CAD HTN HLD  DM2  Plan:  -Cardiac Surgery planning for 2v-CABG on 10/28, appreciate assistance.  -Continue aspirin 81mg  daily. -Continue IV heparin .  -Continue home rosuvastatin. -Continue amlodipine for BP control.  Signed, Fonda Kitty, MD

## 2024-05-18 NOTE — Progress Notes (Signed)
 PHARMACY - ANTICOAGULATION CONSULT NOTE  Pharmacy Consult for heparin  Indication: chest pain/ACS  Allergies  Allergen Reactions   Latex Rash    Blisters    Morphine  And Codeine Itching and Nausea And Vomiting   Other Hives and Rash    Strawberries and oranges    Amoxicillin Other (See Comments)    Caused mouth sores, and Gastric issues    Prednisone     Makes me bleed out of my mouth   Nickel Rash   Penicillins Hives    Patient Measurements: Height: 5' 2.5 (158.8 cm) Weight: 64.4 kg (142 lb) IBW/kg (Calculated) : 51.25 HEPARIN  DW (KG): 64.2  Vital Signs: Temp: 97.9 F (36.6 C) (10/25 0507) Temp Source: Oral (10/25 0507) BP: 146/69 (10/25 0507) Pulse Rate: 95 (10/25 0507)  Labs: Recent Labs    05/18/24 0545 05/18/24 1503  HGB 10.8*  --   HCT 32.7*  --   PLT 361  --   HEPARINUNFRC 0.25* 0.32    CrCl cannot be calculated (Patient's most recent lab result is older than the maximum 21 days allowed.).  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 Pharmacy to start IV heparin  2h after TR band removal (removed ~1520). No AC PTA.   PM: heparin  level therapeutic on 900 units/hr. Per RN, no issues with heparin  running continuously or signs/symptoms of bleeding.  Goal of Therapy:  Heparin  level 0.3-0.7 units/ml Monitor platelets by anticoagulation protocol: Yes   Plan:  Continue Heparin  at 900 units/hr Daily heparin  level and CBC Plans for CABG 10/28  Rocky Slade, PharmD, BCPS Clinical Pharmacist 05/18/2024 4:16 PM

## 2024-05-18 NOTE — Plan of Care (Signed)
  Problem: Activity: Goal: Ability to return to baseline activity level will improve Outcome: Progressing   Problem: Cardiovascular: Goal: Ability to achieve and maintain adequate cardiovascular perfusion will improve Outcome: Progressing Goal: Vascular access site(s) Level 0-1 will be maintained Outcome: Completed/Met   Problem: Health Behavior/Discharge Planning: Goal: Ability to safely manage health-related needs after discharge will improve Outcome: Progressing   Problem: Education: Goal: Knowledge of General Education information will improve Description: Including pain rating scale, medication(s)/side effects and non-pharmacologic comfort measures Outcome: Progressing   Problem: Health Behavior/Discharge Planning: Goal: Ability to manage health-related needs will improve Outcome: Progressing   Problem: Clinical Measurements: Goal: Ability to maintain clinical measurements within normal limits will improve Outcome: Progressing Goal: Will remain free from infection Outcome: Progressing Goal: Diagnostic test results will improve Outcome: Progressing Goal: Respiratory complications will improve Outcome: Progressing Goal: Cardiovascular complication will be avoided Outcome: Progressing   Problem: Activity: Goal: Risk for activity intolerance will decrease Outcome: Progressing   Problem: Nutrition: Goal: Adequate nutrition will be maintained Outcome: Progressing   Problem: Coping: Goal: Level of anxiety will decrease Outcome: Completed/Met   Problem: Elimination: Goal: Will not experience complications related to bowel motility Outcome: Progressing Goal: Will not experience complications related to urinary retention Outcome: Progressing   Problem: Pain Managment: Goal: General experience of comfort will improve and/or be controlled Outcome: Progressing   Problem: Safety: Goal: Ability to remain free from injury will improve Outcome: Progressing   Problem:  Safety: Goal: Ability to remain free from injury will improve Outcome: Progressing   Problem: Skin Integrity: Goal: Risk for impaired skin integrity will decrease Outcome: Progressing

## 2024-05-19 ENCOUNTER — Inpatient Hospital Stay (HOSPITAL_COMMUNITY)

## 2024-05-19 DIAGNOSIS — I200 Unstable angina: Secondary | ICD-10-CM | POA: Diagnosis not present

## 2024-05-19 DIAGNOSIS — E1165 Type 2 diabetes mellitus with hyperglycemia: Secondary | ICD-10-CM | POA: Diagnosis not present

## 2024-05-19 LAB — GLUCOSE, CAPILLARY
Glucose-Capillary: 132 mg/dL — ABNORMAL HIGH (ref 70–99)
Glucose-Capillary: 193 mg/dL — ABNORMAL HIGH (ref 70–99)
Glucose-Capillary: 180 mg/dL — ABNORMAL HIGH (ref 70–99)
Glucose-Capillary: 112 mg/dL — ABNORMAL HIGH (ref 70–99)

## 2024-05-19 LAB — CBC
HCT: 32.4 % — ABNORMAL LOW (ref 36.0–46.0)
Hemoglobin: 10.8 g/dL — ABNORMAL LOW (ref 12.0–15.0)
MCH: 26.3 pg (ref 26.0–34.0)
MCHC: 33.3 g/dL (ref 30.0–36.0)
MCV: 79 fL — ABNORMAL LOW (ref 80.0–100.0)
Platelets: 368 K/uL (ref 150–400)
RBC: 4.1 MIL/uL (ref 3.87–5.11)
RDW: 13.3 % (ref 11.5–15.5)
WBC: 6.9 K/uL (ref 4.0–10.5)
nRBC: 0 % (ref 0.0–0.2)

## 2024-05-19 LAB — BLOOD GAS, ARTERIAL
Acid-base deficit: 0.1 mmol/L (ref 0.0–2.0)
Bicarbonate: 23.9 mmol/L (ref 20.0–28.0)
O2 Saturation: 99.5 %
Patient temperature: 36.4
pCO2 arterial: 35 mmHg (ref 32–48)
pH, Arterial: 7.44 (ref 7.35–7.45)
pO2, Arterial: 170 mmHg — ABNORMAL HIGH (ref 83–108)

## 2024-05-19 LAB — HEPARIN LEVEL (UNFRACTIONATED): Heparin Unfractionated: 0.4 [IU]/mL (ref 0.30–0.70)

## 2024-05-19 NOTE — Progress Notes (Addendum)
 PHARMACY - ANTICOAGULATION CONSULT NOTE  Pharmacy Consult for heparin  Indication: chest pain/ACS  Patient Measurements: Height: 5' 2.5 (158.8 cm) Weight: 64.4 kg (142 lb) IBW/kg (Calculated) : 51.25 HEPARIN  DW (KG): 64.2  Vital Signs: Temp: 98.1 F (36.7 C) (10/26 0358) Temp Source: Oral (10/26 0358) BP: 129/69 (10/26 0358) Pulse Rate: 81 (10/26 0358)  Labs: Recent Labs    05/18/24 0545 05/18/24 1503 05/19/24 0304  HGB 10.8*  --  10.8*  HCT 32.7*  --  32.4*  PLT 361  --  368  HEPARINUNFRC 0.25* 0.32 0.40    CrCl cannot be calculated (Patient's most recent lab result is older than the maximum 21 days allowed.).  Assessment: 89 yoF admitted with positive stress test; for cath 10/24 noted to have mvCAD. Patient now admitted post LHC for CABG evaluation. Patient scheduled for CABG Tuesday 10/28. Pharmacy consulted to dose heparin  in meantime. Patient not on [facility] PTA.   AM heparin  level therapeutic at 0.40 on infusion of 900 units/h. No issues with heparin  infusion or bleeding reported.   Goal of Therapy:  Heparin  level 0.3-0.7 units/ml Monitor platelets by anticoagulation protocol: Yes   Plan:  Continue heparin  at 900 units/h.  Monitor heparin  level, CBC daily while on heparin  infusion.   Maurilio Patten, PharmD PGY1 Pharmacy Resident [facility] 05/19/2024 7:21 AM

## 2024-05-19 NOTE — Plan of Care (Signed)
  Problem: Activity: Goal: Ability to return to baseline activity level will improve 05/19/2024 2248 by Peri Magdalena LABOR, RN Outcome: Progressing 05/19/2024 2247 by Peri Magdalena LABOR, RN Outcome: Progressing   Problem: Cardiovascular: Goal: Ability to achieve and maintain adequate cardiovascular perfusion will improve 05/19/2024 2248 by Peri Magdalena LABOR, RN Outcome: Progressing 05/19/2024 2247 by Peri Magdalena LABOR, RN Outcome: Progressing   Problem: Education: Goal: Knowledge of General Education information will improve Description: Including pain rating scale, medication(s)/side effects and non-pharmacologic comfort measures 05/19/2024 2248 by Peri Magdalena LABOR, RN Outcome: Progressing 05/19/2024 2247 by Peri Magdalena LABOR, RN Outcome: Progressing   Problem: Clinical Measurements: Goal: Cardiovascular complication will be avoided 05/19/2024 2248 by Peri Magdalena LABOR, RN Outcome: Progressing 05/19/2024 2247 by Peri Magdalena LABOR, RN Outcome: Progressing   Problem: Nutrition: Goal: Adequate nutrition will be maintained 05/19/2024 2248 by Peri Magdalena LABOR, RN Outcome: Progressing 05/19/2024 2247 by Peri Magdalena LABOR, RN Outcome: Progressing   Problem: Elimination: Goal: Will not experience complications related to bowel motility 05/19/2024 2248 by Peri Magdalena LABOR, RN Outcome: Progressing 05/19/2024 2247 by Peri Magdalena LABOR, RN Outcome: Progressing Goal: Will not experience complications related to urinary retention 05/19/2024 2248 by Peri Magdalena LABOR, RN Outcome: Progressing 05/19/2024 2247 by Peri Magdalena LABOR, RN Outcome: Progressing   Problem: Safety: Goal: Ability to remain free from injury will improve 05/19/2024 2248 by Peri Magdalena LABOR, RN Outcome: Progressing 05/19/2024 2247 by Peri Magdalena LABOR, RN Outcome: Progressing

## 2024-05-19 NOTE — Progress Notes (Signed)
  Progress Note  Patient Name: Natalie Anderson Date of Encounter: 05/19/2024 [facility] Cardiologist: Alvan Carrier, MD   Interval Summary   No acute overnight events. Patient reports feeling relatively well. Has intermittent mild chest pain lasting just seconds. No new or acute complaints.    Vital Signs Vitals:   05/19/24 0027 05/19/24 0358 05/19/24 0808 05/19/24 1215  BP: (!) 144/76 129/69 (!) 154/85   Pulse: 69 81 (!) 107   Resp: 18 18  18   Temp: 97.8 F (36.6 C) 98.1 F (36.7 C) (!) 97.5 F (36.4 C) 98.8 F (37.1 C)  TempSrc: Oral Oral Oral Oral  SpO2: 95% 97% 100%   Weight:      Height:        Intake/Output Summary (Last 24 hours) at 05/19/2024 1312 Last data filed at 05/19/2024 0904 Gross per 24 hour  Intake 809.75 ml  Output 0 ml  Net 809.75 ml      05/17/2024    4:07 PM 05/17/2024    8:56 AM 05/13/2024   11:16 AM  Last 3 Weights  Weight (lbs) 142 lb 142 lb 142 lb 3.2 oz  Weight (kg) 64.411 kg 64.411 kg 64.501 kg      Telemetry/ECG  SR, no VAs - Personally Reviewed  Physical Exam  General: Well developed, in no acute distress.  Neck: No JVD.  Cardiac: Normal rate, regular rhythm.  Resp: Normal work of breathing.  Ext: No edema.  Neuro: No gross focal deficits.  Psych: Normal affect.   LHC 10/24:  POST-OPERATIVE DIAGNOSIS:   Severe two-vessel disease: 30% Left Main with 99% calcified ostial LAD stenosis followed by a focal eccentric 95% calcified stenosis in the proximal segment and 70% eccentric lesion at the takeoff of a small D1.  The LAD then bifurcates into a large septal perforator and in the distal LAD with that there is TIMI II flow distally LCx is relatively free of disease after the ostium there is a diminutive OM1 and mid vessel terminates as a lateral OM 2 with an AV groove branch giving off small LPL 1 RCA is heavily calcified proximal segment with ostial 60% stenosis (catheter damping) followed by a focal 70% stenosis and  then a long segment of diffuse 20 to 50% stenosis.  The remainder of the RCA is large caliber and free of disease it bifurcates into a large PDA and PAVB with 1 major PLV branch. Normal LVEDP  Assessment & Plan  Natalie Anderson  78 y.o. female with HTN, HLD and DM-2 who underwent LHC on 10/24 for progressive angina. She was found to have severe 2-vessel disease with 99% calcified LAD stenosis and 70% stenosis of the RCA. She was admitted post [facility] for CABG evaluation.  Problem List:  Severe 2 vessel CAD HTN HLD  DM2  Plan:  -Cardiac Surgery planning for 2v-CABG on 10/28, appreciate assistance.  -Continue aspirin 81mg  daily. -Continue IV heparin .  -Continue home rosuvastatin. -Continue amlodipine for BP control.  Signed, Fonda Kitty, MD

## 2024-05-20 ENCOUNTER — Inpatient Hospital Stay (HOSPITAL_COMMUNITY)

## 2024-05-20 DIAGNOSIS — Z01810 Encounter for preprocedural cardiovascular examination: Secondary | ICD-10-CM | POA: Diagnosis not present

## 2024-05-20 DIAGNOSIS — I200 Unstable angina: Secondary | ICD-10-CM | POA: Diagnosis not present

## 2024-05-20 DIAGNOSIS — I2510 Atherosclerotic heart disease of native coronary artery without angina pectoris: Secondary | ICD-10-CM | POA: Diagnosis not present

## 2024-05-20 LAB — BASIC METABOLIC PANEL WITH GFR
Anion gap: 11 (ref 5–15)
BUN: 12 mg/dL (ref 8–23)
CO2: 27 mmol/L (ref 22–32)
Calcium: 9.7 mg/dL (ref 8.9–10.3)
Chloride: 101 mmol/L (ref 98–111)
Creatinine, Ser: 1.09 mg/dL — ABNORMAL HIGH (ref 0.44–1.00)
GFR, Estimated: 52 mL/min — ABNORMAL LOW (ref >60)
Glucose, Bld: 83 mg/dL (ref 70–99)
Potassium: 4.5 mmol/L (ref 3.5–5.1)
Sodium: 139 mmol/L (ref 135–145)

## 2024-05-20 LAB — PROTIME-INR
INR: 1 (ref 0.8–1.2)
Prothrombin Time: 13.7 s (ref 11.4–15.2)

## 2024-05-20 LAB — CBC
HCT: 31.7 % — ABNORMAL LOW (ref 36.0–46.0)
Hemoglobin: 10.4 g/dL — ABNORMAL LOW (ref 12.0–15.0)
MCH: 26.1 pg (ref 26.0–34.0)
MCHC: 32.8 g/dL (ref 30.0–36.0)
MCV: 79.4 fL — ABNORMAL LOW (ref 80.0–100.0)
Platelets: 371 K/uL (ref 150–400)
RBC: 3.99 MIL/uL (ref 3.87–5.11)
RDW: 13.3 % (ref 11.5–15.5)
WBC: 6.6 K/uL (ref 4.0–10.5)
nRBC: 0 % (ref 0.0–0.2)

## 2024-05-20 LAB — VAS US DOPPLER PRE CABG
Left ABI: 0.67
Right ABI: 0.73

## 2024-05-20 LAB — GLUCOSE, CAPILLARY
Glucose-Capillary: 267 mg/dL — ABNORMAL HIGH (ref 70–99)
Glucose-Capillary: 73 mg/dL (ref 70–99)
Glucose-Capillary: 201 mg/dL — ABNORMAL HIGH (ref 70–99)
Glucose-Capillary: 100 mg/dL — ABNORMAL HIGH (ref 70–99)

## 2024-05-20 LAB — LIPOPROTEIN A (LPA): Lipoprotein (a): 29.8 nmol/L (ref <75.0)

## 2024-05-20 LAB — PREPARE RBC (CROSSMATCH)

## 2024-05-20 LAB — HEPARIN LEVEL (UNFRACTIONATED): Heparin Unfractionated: 0.39 [IU]/mL (ref 0.30–0.70)

## 2024-05-20 LAB — APTT: aPTT: 78 s — ABNORMAL HIGH (ref 24–36)

## 2024-05-20 MED ORDER — TEMAZEPAM 15 MG PO CAPS
15.0000 mg | ORAL_CAPSULE | Freq: Once | ORAL | Status: AC | PRN
  Administered 2024-05-20: 15 mg via ORAL
  Filled 2024-05-20: qty 1

## 2024-05-20 MED ORDER — CHLORHEXIDINE GLUCONATE 0.12 % MT SOLN
15.0000 mL | Freq: Once | OROMUCOSAL | Status: AC
  Administered 2024-05-21: 15 mL via OROMUCOSAL
  Filled 2024-05-20: qty 15

## 2024-05-20 MED ORDER — BISACODYL 5 MG PO TBEC: 5.0000 mg | DELAYED_RELEASE_TABLET | Freq: Once | ORAL | Status: DC

## 2024-05-20 MED ORDER — METOPROLOL TARTRATE 12.5 MG HALF TABLET
12.5000 mg | ORAL_TABLET | Freq: Once | ORAL | Status: AC
  Administered 2024-05-21: 12.5 mg via ORAL
  Filled 2024-05-20: qty 1

## 2024-05-20 MED ORDER — LEVOFLOXACIN IN D5W 500 MG/100ML IV SOLN
500.0000 mg | INTRAVENOUS | Status: AC
  Administered 2024-05-21: 500 mg via INTRAVENOUS
  Filled 2024-05-20: qty 100

## 2024-05-20 MED ORDER — NITROGLYCERIN IN D5W 200-5 MCG/ML-% IV SOLN
2.0000 ug/min | INTRAVENOUS | Status: DC
Start: 2024-05-21 — End: 2024-05-22
  Filled 2024-05-20: qty 250

## 2024-05-20 MED ORDER — DEXMEDETOMIDINE HCL IN NACL 400 MCG/100ML IV SOLN
0.1000 ug/kg/h | INTRAVENOUS | Status: AC
Start: 2024-05-21 — End: 2024-05-22
  Administered 2024-05-21: .4 ug/kg/h via INTRAVENOUS
  Filled 2024-05-20: qty 100

## 2024-05-20 MED ORDER — TRANEXAMIC ACID (OHS) BOLUS VIA INFUSION
15.0000 mg/kg | INTRAVENOUS | Status: AC
  Administered 2024-05-21: 966 mg via INTRAVENOUS
  Filled 2024-05-20: qty 966

## 2024-05-20 MED ORDER — MAGNESIUM SULFATE 50 % IJ SOLN
INTRAMUSCULAR | Status: DC
  Filled 2024-05-20: qty 13

## 2024-05-20 MED ORDER — NOREPINEPHRINE 4 MG/250ML-% IV SOLN
0.0000 ug/min | INTRAVENOUS | Status: AC
Start: 2024-05-21 — End: 2024-05-22
  Administered 2024-05-21: 2 ug/min via INTRAVENOUS
  Filled 2024-05-20: qty 250

## 2024-05-20 MED ORDER — POTASSIUM CHLORIDE 2 MEQ/ML IV SOLN
80.0000 meq | INTRAVENOUS | Status: DC
  Filled 2024-05-20: qty 40

## 2024-05-20 MED ORDER — TRANEXAMIC ACID 1000 MG/10ML IV SOLN
1.5000 mg/kg/h | INTRAVENOUS | Status: AC
  Administered 2024-05-21: 1.5 mg/kg/h via INTRAVENOUS
  Filled 2024-05-20: qty 10

## 2024-05-20 MED ORDER — PLASMA-LYTE A IV SOLN
INTRAVENOUS | Status: DC
  Filled 2024-05-20: qty 2.5

## 2024-05-20 MED ORDER — HEPARIN 30,000 UNITS/1000 ML (OHS) CELLSAVER SOLUTION
Status: DC
Start: 2024-05-21 — End: 2024-05-22
  Filled 2024-05-20: qty 1000

## 2024-05-20 MED ORDER — CHLORHEXIDINE GLUCONATE CLOTH 2 % EX PADS
6.0000 | MEDICATED_PAD | Freq: Once | CUTANEOUS | Status: AC
  Administered 2024-05-21: 6 via TOPICAL

## 2024-05-20 MED ORDER — INSULIN REGULAR(HUMAN) IN NACL 100-0.9 UT/100ML-% IV SOLN
INTRAVENOUS | Status: AC
  Administered 2024-05-21: 4 [IU]/h via INTRAVENOUS
  Filled 2024-05-20: qty 100

## 2024-05-20 MED ORDER — VANCOMYCIN HCL 1250 MG/250ML IV SOLN
1250.0000 mg | INTRAVENOUS | Status: AC
  Administered 2024-05-21: 1250 mg via INTRAVENOUS
  Filled 2024-05-20: qty 250

## 2024-05-20 MED ORDER — EPINEPHRINE HCL 5 MG/250ML IV SOLN IN NS
0.0000 ug/min | INTRAVENOUS | Status: DC
Start: 2024-05-21 — End: 2024-05-22
  Filled 2024-05-20: qty 250

## 2024-05-20 MED ORDER — TRANEXAMIC ACID (OHS) PUMP PRIME SOLUTION
2.0000 mg/kg | INTRAVENOUS | Status: DC
  Filled 2024-05-20: qty 1.29

## 2024-05-20 MED ORDER — PHENYLEPHRINE HCL-NACL 20-0.9 MG/250ML-% IV SOLN
30.0000 ug/min | INTRAVENOUS | Status: DC
Start: 2024-05-21 — End: 2024-05-22
  Filled 2024-05-20: qty 250

## 2024-05-20 MED ORDER — MILRINONE LACTATE IN DEXTROSE 20-5 MG/100ML-% IV SOLN
0.3000 ug/kg/min | INTRAVENOUS | Status: DC
  Filled 2024-05-20: qty 100

## 2024-05-20 MED ORDER — CHLORHEXIDINE GLUCONATE CLOTH 2 % EX PADS
6.0000 | MEDICATED_PAD | Freq: Once | CUTANEOUS | Status: AC
  Administered 2024-05-20: 6 via TOPICAL

## 2024-05-20 NOTE — Progress Notes (Signed)
 PHARMACY - ANTICOAGULATION CONSULT NOTE  Pharmacy Consult for heparin  Indication: chest pain/ACS  Patient Measurements: Height: 5' 2.5 (158.8 cm) Weight: 64.4 kg (142 lb) IBW/kg (Calculated) : 51.25 HEPARIN  DW (KG): 64.2  Vital Signs: Temp: 98.3 F (36.8 C) (10/27 0756) Temp Source: Oral (10/27 0756) BP: 130/63 (10/27 0756) Pulse Rate: 101 (10/27 0756)  Labs: Recent Labs    05/18/24 0545 05/18/24 1503 05/19/24 0304 05/20/24 0353  HGB 10.8*  --  10.8* 10.4*  HCT 32.7*  --  32.4* 31.7*  PLT 361  --  368 371  APTT  --   --   --  78*  LABPROT  --   --   --  13.7  INR  --   --   --  1.0  HEPARINUNFRC 0.25* 0.32 0.40 0.39    CrCl cannot be calculated (Patient's most recent lab result is older than the maximum 21 days allowed.).  Assessment: 89 yoF admitted with positive stress test; for cath 10/24 noted to have mvCAD. Patient now admitted post LHC for CABG evaluation. Patient scheduled for CABG Tuesday 10/28. Pharmacy consulted to dose heparin  in meantime. Patient not on AC PTA.   Heparin  level came back therapeutic at 0.39, aPTT came back therapeutic at 78, on 900 units/hr. Hgb 10.4, plt 371. No s/sx of bleeding or infusion issues.   Goal of Therapy:  Heparin  level 0.3-0.7 units/ml Monitor platelets by anticoagulation protocol: Yes   Plan:  Continue heparin  at 900 units/hr Monitor heparin  level, CBC daily while on heparin  infusion Plan for CABG 10/28  Thank you for allowing pharmacy to participate in this patient's care,  Suzen Sour, PharmD, BCCCP Clinical Pharmacist  Phone: [phone] 05/20/2024 8:40 AM  Please check AMION for all [facility] Pharmacy phone numbers After 10:00 PM, call Main Pharmacy [phone]

## 2024-05-20 NOTE — Progress Notes (Signed)
 Discussed with pt IS, sternal precautions, mobility post op, and d/c planning. Pt receptive. She sts her siblings will stay with her at d/c. Pulling 1500 ml on IS currently. She moved to recliner following sternal precautions. She already has OHS booklet. Attempted to set up preop video however her tv does not have the education channel. [phone] Aliene Aris BS, ACSM-CEP 05/20/2024 10:39 AM

## 2024-05-20 NOTE — TOC Initial Note (Signed)
 Transition of Care [facility]) - Initial/Assessment Note    Patient Details  Name: Natalie Anderson MRN: [id number] Date of Birth: 01-Sep-1945  [facility] of [facility]) CM/SW Contact:    Sudie Erminio Deems, RN Phone Number: 05/20/2024, 4:10 PM  Clinical Narrative: Patient presented for chest pain-post LHC and plan for CABG 05-21-24. PTA patient was from home alone with family support. Patients sister was at the bedside during the visit. Patient states she will have support of brother and sister post hospitalization. Patient states she has DME cane and rolling walker in the home. Inpatient Case Manager will continue to follow for disposition needs as the patient progresses.                    Expected Discharge Plan: Home w Home Health Services Barriers to Discharge: Continued Medical Work up   Patient Goals and CMS Choice Patient states their goals for this hospitalization and ongoing recovery are:: Patient wants to get back home and drink her coffee ( usually likes to sit on front porch)  Expected Discharge Plan and Services   Discharge Planning Services: CM Consult Post Acute Care Choice: Home Health Living arrangements for the past 2 months: Single Family Home                   DME Agency: NA  Prior Living Arrangements/Services Living arrangements for the past 2 months: Single Family Home Lives with:: Self Patient language and need for interpreter reviewed:: Yes Do you feel safe going back to the place where you live?: Yes      Need for Family Participation in Patient Care: Yes (Comment) Care giver support system in place?: Yes (comment) Current home services: DME (cane and rolling walker.) Criminal Activity/Legal Involvement Pertinent to Current Situation/Hospitalization: No - Comment as needed  Activities of Daily Living   ADL Screening (condition at time of admission) Independently performs ADLs?: Yes (appropriate for developmental age) Is the patient deaf or have  difficulty hearing?: No Does the patient have difficulty seeing, even when wearing glasses/contacts?: No Does the patient have difficulty concentrating, remembering, or making decisions?: Yes (Memory deficit, per patient report; has legal healthcare power of attorney for assistance in making healthcare decisions)  Permission Sought/Granted Permission sought to share information with : Family Supports, Case Manager                Emotional Assessment Appearance:: Appears stated age Attitude/Demeanor/Rapport: Engaged Affect (typically observed): Appropriate Orientation: : Oriented to Self, Oriented to Place, Oriented to  Time, Oriented to Situation Alcohol  / Substance Use: Not Applicable Psych Involvement: No (comment)  Admission diagnosis:  Progressive angina (HCC) [I20.0] Patient Active Problem List   Diagnosis Date Noted   Progressive angina (HCC) 05/17/2024   Gastroesophageal reflux disease 03/07/2022   Positive FIT (fecal immunochemical test) 10/27/2021   Dysphagia 10/27/2021   PCP:  Maree Isles, MD Pharmacy:   [facility] - [place], [place] - [address] [place] [address] Phone: [phone] Fax: [phone]     Social Drivers of Health (SDOH) Social History: SDOH Screenings   Food Insecurity: No Food Insecurity (05/18/2024)  Housing: Low Risk  (05/18/2024)  Transportation Needs: No Transportation Needs (05/18/2024)  Utilities: Not At Risk (05/18/2024)  Tobacco Use: Low Risk  (05/18/2024)   SDOH Interventions:     Readmission Risk Interventions     No data to display

## 2024-05-20 NOTE — Progress Notes (Signed)
 Pre-CABG testing has been completed. Preliminary results can be found in CV Proc through chart review.   05/20/24 2:48 PM Cathlyn Collet RVT

## 2024-05-20 NOTE — Progress Notes (Signed)
 Rounding Note   Patient Name: Natalie Anderson Date of Encounter: 05/20/2024  [facility] Cardiologist: Alvan Carrier, MD   Subjective  No chest pain  Scheduled Meds:  amLODipine  5 mg Oral QPM   aspirin EC  81 mg Oral q AM   cholecalciferol  5,000 Units Oral QPM   free water   500 mL Oral Once   gabapentin   300 mg Oral BID   insulin  aspart  0-15 Units Subcutaneous TID WC   insulin  glargine-yfgn  20 Units Subcutaneous q AM   loratadine   10 mg Oral Daily   melatonin  3 mg Oral QHS   mupirocin ointment  1 Application Nasal BID   pantoprazole   20 mg Oral Daily   [START ON 05/23/2024] rosuvastatin  5 mg Oral Weekly   sodium chloride  flush  3 mL Intravenous Q12H   Continuous Infusions:  heparin  900 Units/hr (05/20/24 0541)   PRN Meds: acetaminophen , albuterol, fluticasone , ondansetron  (ZOFRAN ) IV, sodium chloride  flush   Vital Signs  Vitals:   05/19/24 2018 05/20/24 0006 05/20/24 0412 05/20/24 0756  BP: (!) 148/50 118/71 126/61 130/63  Pulse: (!) 107 72 86 (!) 101  Resp: 19 16 18 20   Temp: 98.2 F (36.8 C) 97.8 F (36.6 C) 98 F (36.7 C) 98.3 F (36.8 C)  TempSrc: Oral Oral Oral Oral  SpO2: 100% 94% 96% 93%  Weight:      Height:        Intake/Output Summary (Last 24 hours) at 05/20/2024 0957 Last data filed at 05/20/2024 0757 Gross per 24 hour  Intake 729.29 ml  Output --  Net 729.29 ml      05/17/2024    4:07 PM 05/17/2024    8:56 AM 05/13/2024   11:16 AM  Last 3 Weights  Weight (lbs) 142 lb 142 lb 142 lb 3.2 oz  Weight (kg) 64.411 kg 64.411 kg 64.501 kg      Telemetry sinus - Personally Reviewed  ECG  No am tracing - Personally Reviewed  Physical Exam  General: Well developed, well nourished, NAD  HEENT: OP clear, mucus membranes moist  SKIN: warm, dry. No rashes. Neuro: No focal deficits  Musculoskeletal: Muscle strength 5/5 all ext  Psychiatric: Mood and affect normal  Neck: No JVD  Lungs:Clear bilaterally, no wheezes,  rhonci, crackles Cardiovascular: Regular rate and rhythm.  Abdomen:Soft.  Extremities: No lower extremity edema.   Labs High Sensitivity Troponin:  No results for input(s): TROPONINIHS in the last 720 hours.   ChemistryNo results for input(s): NA, K, CL, CO2, GLUCOSE, BUN, CREATININE, CALCIUM , MG, PROT, ALBUMIN, AST, ALT, ALKPHOS, BILITOT, GFRNONAA, GFRAA, ANIONGAP in the last 168 hours.  Lipids No results for input(s): CHOL, TRIG, HDL, LABVLDL, LDLCALC, CHOLHDL in the last 168 hours.  Hematology Recent Labs  Lab 05/18/24 0545 05/19/24 0304 05/20/24 0353  WBC 5.8 6.9 6.6  RBC 4.11 4.10 3.99  HGB 10.8* 10.8* 10.4*  HCT 32.7* 32.4* 31.7*  MCV 79.6* 79.0* 79.4*  MCH 26.3 26.3 26.1  MCHC 33.0 33.3 32.8  RDW 13.4 13.3 13.3  PLT 361 368 371   Thyroid  No results for input(s): TSH, FREET4 in the last 168 hours.  BNPNo results for input(s): BNP, PROBNP in the last 168 hours.  DDimer No results for input(s): DDIMER in the last 168 hours.   Radiology  DG Chest 2 View Result Date: 05/19/2024 CLINICAL DATA:  Preop exam. EXAM: DG CHEST 2V COMPARISON:  Radiographs 12/31/2010 FINDINGS: Low lung volumes. Normal heart size  for technique. Minor basilar atelectasis. No confluent opacity. No pulmonary edema, pneumothorax or significant pleural effusion. Remote left rib fractures. IMPRESSION: Low lung volumes with minor basilar atelectasis. Electronically Signed   By: Andrea Gasman M.D.   On: 05/19/2024 12:05    Cardiac Studies  Echocardiogram 04/15/2024: - LV: Normal size with EF of 72% and grade 1 diastolic dysfunction. - RV: Normal size and function. - LA: Normal size. - RA: Normal size. - Aortic valve: Trileaflet of aortic valve. Sclerosis but stenosis. No regurgitation. - Mital valve: Midl MAC and moderate mitral valve leaflet thickening. No regurgitation. _______________  Left Cardiac Catheterization 05/17/2024:   Mid LM to  Ost LAD lesion is 30% stenosed with 20% stenosed side branch in Ost Cx to Prox Cx.   Ost LAD to Prox LAD lesion is 99% stenosed. Prox LAD to Mid LAD lesion is 95% stenosed. Mid LAD lesion is 70% stenosed with 30% stenosed side branch in 1st Diag.  Distally the LAD has TIMI II flow.  The LAD branches into a septal perforator, interventricular branch and major diagonal branch which reaches the apex.   Ost RCA lesion is 60% stenosed. Prox RCA-1 lesion is 70% stenosed. Prox RCA-2 lesion is 50% stenosed.   Remainder the LCx and RCA relatively free of disease.   -----------------------------------------   LV end diastolic pressure is normal.   Dominance: Right     PLAN OF CARE:  Plan for TCTS Consult to consider CABG 2/2 Severe LAD & RCA disease not favorable for PCI.  Admit to inpatient  -> start heparin  2 hours after TR band removal.  TCTS consultation.  Continue home medications.  Hold metformin  and use sliding scale insulin     Patient Profile   78 y.o. female with a history of hypertension, hyperlipidemia, type 2 diabetes mellitus, COPD, migraines, and chronic back pain who was recently seen by Dr. Alvan in the office on 05/13/2024 and reported chest pain and shortness of breath for the last 5-6 weeks. Outpatient cardiac catheterization was arranged. He presented for this on 05/17/2024 and LHC showed severe 2 vessel CAD affecting the LAD and RCA. She was admitted and CT surgery was consulted. Plan is for CABG on 05/21/2024.  Assessment & Plan   CAD with unstable angina: Cardiac cath 05/17/24 with severe 2 vessel CAD (LAD and RCA). Planning for CABG tomorrow. She is chest pain free. Continue ASA and statin.   Hypertension: BP is controlled. Hold Losartan starting tomorrow.   Hyperlipidemia: Continue statin. Lipoprotein (a) 29.8 on 05/18/2024. Fasting lipids tomorrow.   Type 2 Diabetes Mellitus: Hemoglobin A1c 6.1% this admission.On Metformin  and Mounjaro at home. Both on hold.Continue SSI.    COPD: Stable. No wheezing. Continue prn albuterol inhaler.    For questions or updates, please contact [facility] Please consult [email or web address] for contact info under   Signed, Callie E Goodrich, PA-C  05/20/2024, 9:57 AM

## 2024-05-21 ENCOUNTER — Encounter (HOSPITAL_COMMUNITY): Payer: Self-pay | Admitting: Cardiology

## 2024-05-21 ENCOUNTER — Inpatient Hospital Stay (HOSPITAL_COMMUNITY): Payer: Self-pay | Admitting: Critical Care Medicine

## 2024-05-21 ENCOUNTER — Inpatient Hospital Stay (HOSPITAL_COMMUNITY)

## 2024-05-21 ENCOUNTER — Inpatient Hospital Stay (HOSPITAL_COMMUNITY)
Admission: AD | Disposition: A | Discharge status: Home Health | Payer: Self-pay | Source: Home / Self Care | Attending: Thoracic Surgery (Cardiothoracic Vascular Surgery)

## 2024-05-21 DIAGNOSIS — R918 Other nonspecific abnormal finding of lung field: Secondary | ICD-10-CM | POA: Diagnosis not present

## 2024-05-21 DIAGNOSIS — I10 Essential (primary) hypertension: Secondary | ICD-10-CM | POA: Diagnosis not present

## 2024-05-21 DIAGNOSIS — J449 Chronic obstructive pulmonary disease, unspecified: Secondary | ICD-10-CM | POA: Diagnosis not present

## 2024-05-21 DIAGNOSIS — E785 Hyperlipidemia, unspecified: Secondary | ICD-10-CM

## 2024-05-21 DIAGNOSIS — I2510 Atherosclerotic heart disease of native coronary artery without angina pectoris: Secondary | ICD-10-CM

## 2024-05-21 DIAGNOSIS — E119 Type 2 diabetes mellitus without complications: Secondary | ICD-10-CM

## 2024-05-21 DIAGNOSIS — I25110 Atherosclerotic heart disease of native coronary artery with unstable angina pectoris: Secondary | ICD-10-CM | POA: Diagnosis not present

## 2024-05-21 DIAGNOSIS — Z951 Presence of aortocoronary bypass graft: Secondary | ICD-10-CM | POA: Diagnosis not present

## 2024-05-21 DIAGNOSIS — D649 Anemia, unspecified: Secondary | ICD-10-CM | POA: Diagnosis not present

## 2024-05-21 DIAGNOSIS — Z4682 Encounter for fitting and adjustment of non-vascular catheter: Secondary | ICD-10-CM | POA: Diagnosis not present

## 2024-05-21 DIAGNOSIS — Z48812 Encounter for surgical aftercare following surgery on the circulatory system: Secondary | ICD-10-CM | POA: Diagnosis not present

## 2024-05-21 HISTORY — PX: CORONARY ARTERY BYPASS GRAFT: SHX141

## 2024-05-21 LAB — CBC
HCT: 32.7 % — ABNORMAL LOW (ref 36.0–46.0)
HCT: 26.8 % — ABNORMAL LOW (ref 36.0–46.0)
HCT: 27.1 % — ABNORMAL LOW (ref 36.0–46.0)
Hemoglobin: 10.8 g/dL — ABNORMAL LOW (ref 12.0–15.0)
Hemoglobin: 8.6 g/dL — ABNORMAL LOW (ref 12.0–15.0)
Hemoglobin: 9 g/dL — ABNORMAL LOW (ref 12.0–15.0)
MCH: 26.6 pg (ref 26.0–34.0)
MCH: 26.1 pg (ref 26.0–34.0)
MCH: 26.9 pg (ref 26.0–34.0)
MCHC: 33 g/dL (ref 30.0–36.0)
MCHC: 32.1 g/dL (ref 30.0–36.0)
MCHC: 33.2 g/dL (ref 30.0–36.0)
MCV: 80.5 fL (ref 80.0–100.0)
MCV: 81.2 fL (ref 80.0–100.0)
MCV: 80.9 fL (ref 80.0–100.0)
Platelets: 349 K/uL (ref 150–400)
Platelets: 271 K/uL (ref 150–400)
Platelets: 331 K/uL (ref 150–400)
RBC: 4.06 MIL/uL (ref 3.87–5.11)
RBC: 3.3 MIL/uL — ABNORMAL LOW (ref 3.87–5.11)
RBC: 3.35 MIL/uL — ABNORMAL LOW (ref 3.87–5.11)
RDW: 13.4 % (ref 11.5–15.5)
RDW: 13.2 % (ref 11.5–15.5)
RDW: 13.2 % (ref 11.5–15.5)
WBC: 7.2 K/uL (ref 4.0–10.5)
WBC: 11.4 K/uL — ABNORMAL HIGH (ref 4.0–10.5)
WBC: 15.3 K/uL — ABNORMAL HIGH (ref 4.0–10.5)
nRBC: 0 % (ref 0.0–0.2)
nRBC: 0 % (ref 0.0–0.2)
nRBC: 0 % (ref 0.0–0.2)

## 2024-05-21 LAB — GLUCOSE, CAPILLARY
Glucose-Capillary: 173 mg/dL — ABNORMAL HIGH (ref 70–99)
Glucose-Capillary: 113 mg/dL — ABNORMAL HIGH (ref 70–99)
Glucose-Capillary: 116 mg/dL — ABNORMAL HIGH (ref 70–99)
Glucose-Capillary: 110 mg/dL — ABNORMAL HIGH (ref 70–99)
Glucose-Capillary: 127 mg/dL — ABNORMAL HIGH (ref 70–99)
Glucose-Capillary: 158 mg/dL — ABNORMAL HIGH (ref 70–99)
Glucose-Capillary: 210 mg/dL — ABNORMAL HIGH (ref 70–99)
Glucose-Capillary: 208 mg/dL — ABNORMAL HIGH (ref 70–99)
Glucose-Capillary: 201 mg/dL — ABNORMAL HIGH (ref 70–99)
Glucose-Capillary: 199 mg/dL — ABNORMAL HIGH (ref 70–99)
Glucose-Capillary: 177 mg/dL — ABNORMAL HIGH (ref 70–99)

## 2024-05-21 LAB — BASIC METABOLIC PANEL WITH GFR
Anion gap: 12 (ref 5–15)
Anion gap: 10 (ref 5–15)
BUN: 13 mg/dL (ref 8–23)
BUN: 13 mg/dL (ref 8–23)
CO2: 23 mmol/L (ref 22–32)
CO2: 19 mmol/L — ABNORMAL LOW (ref 22–32)
Calcium: 9.1 mg/dL (ref 8.9–10.3)
Calcium: 8.1 mg/dL — ABNORMAL LOW (ref 8.9–10.3)
Chloride: 102 mmol/L (ref 98–111)
Chloride: 100 mmol/L (ref 98–111)
Creatinine, Ser: 1.08 mg/dL — ABNORMAL HIGH (ref 0.44–1.00)
Creatinine, Ser: 1.03 mg/dL — ABNORMAL HIGH (ref 0.44–1.00)
GFR, Estimated: 53 mL/min — ABNORMAL LOW (ref >60)
GFR, Estimated: 56 mL/min — ABNORMAL LOW (ref >60)
Glucose, Bld: 102 mg/dL — ABNORMAL HIGH (ref 70–99)
Glucose, Bld: 170 mg/dL — ABNORMAL HIGH (ref 70–99)
Potassium: 3.9 mmol/L (ref 3.5–5.1)
Potassium: 4.3 mmol/L (ref 3.5–5.1)
Sodium: 137 mmol/L (ref 135–145)
Sodium: 129 mmol/L — ABNORMAL LOW (ref 135–145)

## 2024-05-21 LAB — LIPID PANEL
Cholesterol: 217 mg/dL — ABNORMAL HIGH (ref 0–200)
HDL: 44 mg/dL (ref >40)
LDL Cholesterol: 139 mg/dL — ABNORMAL HIGH (ref 0–99)
Total CHOL/HDL Ratio: 4.9 ratio
Triglycerides: 169 mg/dL — ABNORMAL HIGH (ref <150)
VLDL: 34 mg/dL (ref 0–40)

## 2024-05-21 LAB — POCT I-STAT 7, (LYTES, BLD GAS, ICA,H+H)
Acid-base deficit: 4 mmol/L — ABNORMAL HIGH (ref 0.0–2.0)
Acid-base deficit: 4 mmol/L — ABNORMAL HIGH (ref 0.0–2.0)
Acid-base deficit: 5 mmol/L — ABNORMAL HIGH (ref 0.0–2.0)
Acid-base deficit: 6 mmol/L — ABNORMAL HIGH (ref 0.0–2.0)
Acid-base deficit: 6 mmol/L — ABNORMAL HIGH (ref 0.0–2.0)
Bicarbonate: 21.6 mmol/L (ref 20.0–28.0)
Bicarbonate: 21.1 mmol/L (ref 20.0–28.0)
Bicarbonate: 20.5 mmol/L (ref 20.0–28.0)
Bicarbonate: 19.3 mmol/L — ABNORMAL LOW (ref 20.0–28.0)
Bicarbonate: 18.7 mmol/L — ABNORMAL LOW (ref 20.0–28.0)
Calcium, Ion: 1.23 mmol/L (ref 1.15–1.40)
Calcium, Ion: 1.23 mmol/L (ref 1.15–1.40)
Calcium, Ion: 1.19 mmol/L (ref 1.15–1.40)
Calcium, Ion: 1.18 mmol/L (ref 1.15–1.40)
Calcium, Ion: 1.17 mmol/L (ref 1.15–1.40)
HCT: 26 % — ABNORMAL LOW (ref 36.0–46.0)
HCT: 27 % — ABNORMAL LOW (ref 36.0–46.0)
HCT: 22 % — ABNORMAL LOW (ref 36.0–46.0)
HCT: 23 % — ABNORMAL LOW (ref 36.0–46.0)
HCT: 25 % — ABNORMAL LOW (ref 36.0–46.0)
Hemoglobin: 8.8 g/dL — ABNORMAL LOW (ref 12.0–15.0)
Hemoglobin: 9.2 g/dL — ABNORMAL LOW (ref 12.0–15.0)
Hemoglobin: 7.5 g/dL — ABNORMAL LOW (ref 12.0–15.0)
Hemoglobin: 7.8 g/dL — ABNORMAL LOW (ref 12.0–15.0)
Hemoglobin: 8.5 g/dL — ABNORMAL LOW (ref 12.0–15.0)
O2 Saturation: 99 %
O2 Saturation: 97 %
O2 Saturation: 96 %
O2 Saturation: 90 %
O2 Saturation: 87 %
Patient temperature: 35
Patient temperature: 36.9
Patient temperature: 97.5
Patient temperature: 98.3
Potassium: 4 mmol/L (ref 3.5–5.1)
Potassium: 4 mmol/L (ref 3.5–5.1)
Potassium: 4.1 mmol/L (ref 3.5–5.1)
Potassium: 4.1 mmol/L (ref 3.5–5.1)
Potassium: 4.2 mmol/L (ref 3.5–5.1)
Sodium: 135 mmol/L (ref 135–145)
Sodium: 136 mmol/L (ref 135–145)
Sodium: 136 mmol/L (ref 135–145)
Sodium: 136 mmol/L (ref 135–145)
Sodium: 133 mmol/L — ABNORMAL LOW (ref 135–145)
TCO2: 23 mmol/L (ref 22–32)
TCO2: 22 mmol/L (ref 22–32)
TCO2: 22 mmol/L (ref 22–32)
TCO2: 20 mmol/L — ABNORMAL LOW (ref 22–32)
TCO2: 20 mmol/L — ABNORMAL LOW (ref 22–32)
pCO2 arterial: 41.7 mmHg (ref 32–48)
pCO2 arterial: 35.9 mmHg (ref 32–48)
pCO2 arterial: 41.2 mmHg (ref 32–48)
pCO2 arterial: 36.4 mmHg (ref 32–48)
pCO2 arterial: 34.9 mmHg (ref 32–48)
pH, Arterial: 7.324 — ABNORMAL LOW (ref 7.35–7.45)
pH, Arterial: 7.369 (ref 7.35–7.45)
pH, Arterial: 7.305 — ABNORMAL LOW (ref 7.35–7.45)
pH, Arterial: 7.33 — ABNORMAL LOW (ref 7.35–7.45)
pH, Arterial: 7.337 — ABNORMAL LOW (ref 7.35–7.45)
pO2, Arterial: 154 mmHg — ABNORMAL HIGH (ref 83–108)
pO2, Arterial: 87 mmHg (ref 83–108)
pO2, Arterial: 90 mmHg (ref 83–108)
pO2, Arterial: 61 mmHg — ABNORMAL LOW (ref 83–108)
pO2, Arterial: 56 mmHg — ABNORMAL LOW (ref 83–108)

## 2024-05-21 LAB — MAGNESIUM: Magnesium: 3 mg/dL — ABNORMAL HIGH (ref 1.7–2.4)

## 2024-05-21 LAB — POCT I-STAT, CHEM 8
BUN: 12 mg/dL (ref 8–23)
BUN: 12 mg/dL (ref 8–23)
BUN: 11 mg/dL (ref 8–23)
Calcium, Ion: 1.31 mmol/L (ref 1.15–1.40)
Calcium, Ion: 1.27 mmol/L (ref 1.15–1.40)
Calcium, Ion: 1.25 mmol/L (ref 1.15–1.40)
Chloride: 100 mmol/L (ref 98–111)
Chloride: 100 mmol/L (ref 98–111)
Chloride: 101 mmol/L (ref 98–111)
Creatinine, Ser: 1.1 mg/dL — ABNORMAL HIGH (ref 0.44–1.00)
Creatinine, Ser: 0.9 mg/dL (ref 0.44–1.00)
Creatinine, Ser: 0.9 mg/dL (ref 0.44–1.00)
Glucose, Bld: 193 mg/dL — ABNORMAL HIGH (ref 70–99)
Glucose, Bld: 159 mg/dL — ABNORMAL HIGH (ref 70–99)
Glucose, Bld: 130 mg/dL — ABNORMAL HIGH (ref 70–99)
HCT: 31 % — ABNORMAL LOW (ref 36.0–46.0)
HCT: 29 % — ABNORMAL LOW (ref 36.0–46.0)
HCT: 30 % — ABNORMAL LOW (ref 36.0–46.0)
Hemoglobin: 10.5 g/dL — ABNORMAL LOW (ref 12.0–15.0)
Hemoglobin: 9.9 g/dL — ABNORMAL LOW (ref 12.0–15.0)
Hemoglobin: 10.2 g/dL — ABNORMAL LOW (ref 12.0–15.0)
Potassium: 4 mmol/L (ref 3.5–5.1)
Potassium: 4 mmol/L (ref 3.5–5.1)
Potassium: 4 mmol/L (ref 3.5–5.1)
Sodium: 136 mmol/L (ref 135–145)
Sodium: 134 mmol/L — ABNORMAL LOW (ref 135–145)
Sodium: 136 mmol/L (ref 135–145)
TCO2: 24 mmol/L (ref 22–32)
TCO2: 23 mmol/L (ref 22–32)
TCO2: 20 mmol/L — ABNORMAL LOW (ref 22–32)

## 2024-05-21 LAB — ECHO INTRAOPERATIVE TEE
Height: 62.5 in
Weight: 2271.62 [oz_av]

## 2024-05-21 LAB — PROTIME-INR
INR: 1.7 — ABNORMAL HIGH (ref 0.8–1.2)
Prothrombin Time: 20.9 s — ABNORMAL HIGH (ref 11.4–15.2)

## 2024-05-21 LAB — HEPARIN LEVEL (UNFRACTIONATED): Heparin Unfractionated: 0.4 [IU]/mL (ref 0.30–0.70)

## 2024-05-21 LAB — APTT: aPTT: 48 s — ABNORMAL HIGH (ref 24–36)

## 2024-05-21 SURGERY — OFF PUMP CORONARY ARTERY BYPASS GRAFTING (CABG)
Anesthesia: General | Site: Chest

## 2024-05-21 MED ORDER — ACETAMINOPHEN 160 MG/5ML PO SOLN: 1000.0000 mg | Freq: Four times a day (QID) | ORAL | Status: AC

## 2024-05-21 MED ORDER — PROTAMINE SULFATE 10 MG/ML IV SOLN
INTRAVENOUS | Status: DC | PRN
  Administered 2024-05-21: 5 mg via INTRAVENOUS
  Administered 2024-05-21: 75 mg via INTRAVENOUS

## 2024-05-21 MED ORDER — METOPROLOL TARTRATE 25 MG/10 ML ORAL SUSPENSION: 12.5000 mg | Freq: Two times a day (BID) | ORAL | Status: DC

## 2024-05-21 MED ORDER — ASPIRIN 81 MG PO CHEW: 324.0000 mg | CHEWABLE_TABLET | Freq: Every day | ORAL | Status: DC

## 2024-05-21 MED ORDER — PLASMA-LYTE A IV SOLN
INTRAVENOUS | Status: DC | PRN
  Administered 2024-05-21: 500 mL via INTRAVASCULAR

## 2024-05-21 MED ORDER — MIDAZOLAM HCL (PF) 2 MG/2ML IJ SOLN: 2.0000 mg | INTRAMUSCULAR | Status: DC | PRN

## 2024-05-21 MED ORDER — IPRATROPIUM-ALBUTEROL 0.5-2.5 (3) MG/3ML IN SOLN
3.0000 mL | RESPIRATORY_TRACT | Status: DC | PRN
  Administered 2024-05-21: 3 mL via RESPIRATORY_TRACT
  Filled 2024-05-21: qty 3

## 2024-05-21 MED ORDER — SODIUM CHLORIDE 0.9% FLUSH: 3.0000 mL | INTRAVENOUS | Status: DC | PRN

## 2024-05-21 MED ORDER — POTASSIUM CHLORIDE 10 MEQ/50ML IV SOLN: 10.0000 meq | INTRAVENOUS | Status: AC

## 2024-05-21 MED ORDER — CLEVIDIPINE BUTYRATE 0.5 MG/ML IV EMUL
INTRAVENOUS | Status: AC
  Filled 2024-05-21: qty 100

## 2024-05-21 MED ORDER — MIDAZOLAM HCL 2 MG/2ML IJ SOLN
INTRAMUSCULAR | Status: AC
Start: 2024-05-21 — End: 2024-05-21
  Filled 2024-05-21: qty 2

## 2024-05-21 MED ORDER — REVEFENACIN 175 MCG/3ML IN SOLN
175.0000 ug | Freq: Every day | RESPIRATORY_TRACT | Status: DC
  Administered 2024-05-21 – 2024-05-28 (×8): 175 ug via RESPIRATORY_TRACT
  Filled 2024-05-21 (×8): qty 3

## 2024-05-21 MED ORDER — METOCLOPRAMIDE HCL 5 MG/ML IJ SOLN
10.0000 mg | Freq: Four times a day (QID) | INTRAMUSCULAR | Status: AC
  Administered 2024-05-21 – 2024-05-22 (×6): 10 mg via INTRAVENOUS
  Filled 2024-05-21 (×6): qty 2

## 2024-05-21 MED ORDER — BISACODYL 10 MG RE SUPP: 10.0000 mg | Freq: Every day | RECTAL | Status: DC

## 2024-05-21 MED ORDER — NITROGLYCERIN 0.2 MG/ML ON CALL CATH LAB
INTRAVENOUS | Status: DC | PRN
  Administered 2024-05-21: 40 ug via INTRAVENOUS

## 2024-05-21 MED ORDER — SODIUM CHLORIDE 0.9% FLUSH
3.0000 mL | Freq: Two times a day (BID) | INTRAVENOUS | Status: DC
Start: 2024-05-22 — End: 2024-05-25
  Administered 2024-05-22 – 2024-05-25 (×7): 3 mL via INTRAVENOUS

## 2024-05-21 MED ORDER — SODIUM CHLORIDE 0.9 % IV SOLN: INTRAVENOUS | Status: AC

## 2024-05-21 MED ORDER — ENSURE PRE-SURGERY PO LIQD
296.0000 mL | Freq: Once | ORAL | Status: AC
  Administered 2024-05-21: 296 mL via ORAL
  Filled 2024-05-21: qty 296

## 2024-05-21 MED ORDER — ACETAMINOPHEN 160 MG/5ML PO SOLN: 650.0000 mg | Freq: Once | ORAL | Status: DC

## 2024-05-21 MED ORDER — CHLORHEXIDINE GLUCONATE CLOTH 2 % EX PADS
6.0000 | MEDICATED_PAD | Freq: Every day | CUTANEOUS | Status: DC
  Administered 2024-05-21 – 2024-05-25 (×5): 6 via TOPICAL

## 2024-05-21 MED ORDER — ACETAMINOPHEN 500 MG PO TABS
1000.0000 mg | ORAL_TABLET | Freq: Four times a day (QID) | ORAL | Status: AC
  Administered 2024-05-21 – 2024-05-26 (×16): 1000 mg via ORAL
  Filled 2024-05-21 (×18): qty 2

## 2024-05-21 MED ORDER — INSULIN REGULAR(HUMAN) IN NACL 100-0.9 UT/100ML-% IV SOLN: INTRAVENOUS | Status: AC

## 2024-05-21 MED ORDER — FENTANYL CITRATE (PF) 250 MCG/5ML IJ SOLN
INTRAMUSCULAR | Status: AC
  Filled 2024-05-21: qty 5

## 2024-05-21 MED ORDER — ORAL CARE MOUTH RINSE: 15.0000 mL | OROMUCOSAL | Status: DC | PRN

## 2024-05-21 MED ORDER — 0.9 % SODIUM CHLORIDE (POUR BTL) OPTIME
TOPICAL | Status: DC | PRN
  Administered 2024-05-21: 5000 mL

## 2024-05-21 MED ORDER — SODIUM CHLORIDE 0.45 % IV SOLN: INTRAVENOUS | Status: AC | PRN

## 2024-05-21 MED ORDER — LACTATED RINGERS IV SOLN: INTRAVENOUS | Status: DC | PRN

## 2024-05-21 MED ORDER — DEXMEDETOMIDINE HCL IN NACL 400 MCG/100ML IV SOLN: 0.0000 ug/kg/h | INTRAVENOUS | Status: DC

## 2024-05-21 MED ORDER — VANCOMYCIN HCL IN DEXTROSE 1-5 GM/200ML-% IV SOLN
1000.0000 mg | Freq: Once | INTRAVENOUS | Status: AC
  Administered 2024-05-21: 1000 mg via INTRAVENOUS
  Filled 2024-05-21: qty 200

## 2024-05-21 MED ORDER — SODIUM CHLORIDE (PF) 0.9 % IJ SOLN: OROMUCOSAL | Status: DC | PRN

## 2024-05-21 MED ORDER — NOREPINEPHRINE BITARTRATE 1 MG/ML IV SOLN
INTRAVENOUS | Status: DC | PRN
  Administered 2024-05-21: .5 mL via INTRAVENOUS
  Administered 2024-05-21: 1 mL via INTRAVENOUS
  Administered 2024-05-21 (×4): .5 mL via INTRAVENOUS

## 2024-05-21 MED ORDER — METOPROLOL TARTRATE 5 MG/5ML IV SOLN: 2.5000 mg | INTRAVENOUS | Status: DC | PRN

## 2024-05-21 MED ORDER — TRAMADOL HCL 50 MG PO TABS
50.0000 mg | ORAL_TABLET | ORAL | Status: DC | PRN
  Administered 2024-05-22 (×2): 100 mg via ORAL
  Filled 2024-05-21 (×2): qty 2

## 2024-05-21 MED ORDER — FENTANYL CITRATE (PF) 50 MCG/ML IJ SOSY
25.0000 ug | PREFILLED_SYRINGE | INTRAMUSCULAR | Status: DC | PRN
  Administered 2024-05-22: 25 ug via INTRAVENOUS
  Filled 2024-05-21: qty 1

## 2024-05-21 MED ORDER — HEPARIN SODIUM (PORCINE) 1000 UNIT/ML IJ SOLN
INTRAMUSCULAR | Status: DC | PRN
  Administered 2024-05-21: 10000 [IU] via INTRAVENOUS
  Administered 2024-05-21: 4000 [IU] via INTRAVENOUS

## 2024-05-21 MED ORDER — PHENYLEPHRINE 80 MCG/ML (10ML) SYRINGE FOR IV PUSH (FOR BLOOD PRESSURE SUPPORT)
PREFILLED_SYRINGE | INTRAVENOUS | Status: DC | PRN
  Administered 2024-05-21: 80 ug via INTRAVENOUS
  Administered 2024-05-21: 160 ug via INTRAVENOUS

## 2024-05-21 MED ORDER — NITROGLYCERIN IN D5W 200-5 MCG/ML-% IV SOLN: 0.0000 ug/min | INTRAVENOUS | Status: DC

## 2024-05-21 MED ORDER — PROPOFOL 10 MG/ML IV BOLUS
INTRAVENOUS | Status: DC | PRN
  Administered 2024-05-21 (×3): 20 mg via INTRAVENOUS
  Administered 2024-05-21: 50 mg via INTRAVENOUS

## 2024-05-21 MED ORDER — ASPIRIN 325 MG PO TBEC
325.0000 mg | DELAYED_RELEASE_TABLET | Freq: Every day | ORAL | Status: DC
  Administered 2024-05-22 – 2024-05-25 (×4): 325 mg via ORAL
  Filled 2024-05-21 (×4): qty 1

## 2024-05-21 MED ORDER — ASPIRIN 81 MG PO CHEW
324.0000 mg | CHEWABLE_TABLET | Freq: Once | ORAL | Status: AC
  Administered 2024-05-21: 324 mg via ORAL
  Filled 2024-05-21: qty 4

## 2024-05-21 MED ORDER — MIDAZOLAM HCL 2 MG/2ML IJ SOLN
INTRAMUSCULAR | Status: AC
  Filled 2024-05-21: qty 2

## 2024-05-21 MED ORDER — CHLORHEXIDINE GLUCONATE 0.12 % MT SOLN
15.0000 mL | OROMUCOSAL | Status: AC
  Administered 2024-05-21: 15 mL via OROMUCOSAL
  Filled 2024-05-21: qty 15

## 2024-05-21 MED ORDER — SODIUM CHLORIDE 0.9 % IV SOLN
250.0000 mL | INTRAVENOUS | Status: DC
  Administered 2024-05-22: 250 mL via INTRAVENOUS

## 2024-05-21 MED ORDER — PROPOFOL 10 MG/ML IV BOLUS
INTRAVENOUS | Status: AC
  Filled 2024-05-21: qty 20

## 2024-05-21 MED ORDER — PANTOPRAZOLE SODIUM 40 MG IV SOLR
40.0000 mg | Freq: Every day | INTRAVENOUS | Status: AC
  Administered 2024-05-21 – 2024-05-22 (×2): 40 mg via INTRAVENOUS
  Filled 2024-05-21 (×2): qty 10

## 2024-05-21 MED ORDER — EPINEPHRINE 1 MG/10ML IV SOSY
PREFILLED_SYRINGE | INTRAVENOUS | Status: DC | PRN
  Administered 2024-05-21: 10 ug via INTRAVENOUS

## 2024-05-21 MED ORDER — LACTATED RINGERS IV SOLN: INTRAVENOUS | Status: AC

## 2024-05-21 MED ORDER — CLEVIDIPINE BUTYRATE 0.5 MG/ML IV EMUL
0.0000 mg/h | INTRAVENOUS | Status: DC
  Administered 2024-05-21: 8 mg/h via INTRAVENOUS
  Administered 2024-05-21: 2 mg/h via INTRAVENOUS
  Administered 2024-05-22 (×2): 8 mg/h via INTRAVENOUS
  Filled 2024-05-21 (×4): qty 100

## 2024-05-21 MED ORDER — ROCURONIUM BROMIDE 10 MG/ML (PF) SYRINGE
PREFILLED_SYRINGE | INTRAVENOUS | Status: DC | PRN
  Administered 2024-05-21: 80 mg via INTRAVENOUS
  Administered 2024-05-21: 10 mg via INTRAVENOUS
  Administered 2024-05-21: 20 mg via INTRAVENOUS
  Administered 2024-05-21: 30 mg via INTRAVENOUS

## 2024-05-21 MED ORDER — BISACODYL 5 MG PO TBEC
10.0000 mg | DELAYED_RELEASE_TABLET | Freq: Every day | ORAL | Status: DC
  Administered 2024-05-22 – 2024-05-25 (×4): 10 mg via ORAL
  Filled 2024-05-21 (×4): qty 2

## 2024-05-21 MED ORDER — CEFAZOLIN SODIUM-DEXTROSE 2-4 GM/100ML-% IV SOLN
2.0000 g | Freq: Three times a day (TID) | INTRAVENOUS | Status: AC
  Administered 2024-05-21 – 2024-05-23 (×6): 2 g via INTRAVENOUS
  Filled 2024-05-21 (×6): qty 100

## 2024-05-21 MED ORDER — FENTANYL CITRATE (PF) 250 MCG/5ML IJ SOLN
INTRAMUSCULAR | Status: DC | PRN
  Administered 2024-05-21: 50 ug via INTRAVENOUS
  Administered 2024-05-21: 100 ug via INTRAVENOUS
  Administered 2024-05-21: 150 ug via INTRAVENOUS
  Administered 2024-05-21: 250 ug via INTRAVENOUS
  Administered 2024-05-21: 100 ug via INTRAVENOUS

## 2024-05-21 MED ORDER — LIDOCAINE 2% (20 MG/ML) 5 ML SYRINGE
INTRAMUSCULAR | Status: AC
  Filled 2024-05-21: qty 5

## 2024-05-21 MED ORDER — PANTOPRAZOLE SODIUM 40 MG PO TBEC
40.0000 mg | DELAYED_RELEASE_TABLET | Freq: Every day | ORAL | Status: DC
  Administered 2024-05-23 – 2024-05-28 (×6): 40 mg via ORAL
  Filled 2024-05-21 (×6): qty 1

## 2024-05-21 MED ORDER — DOCUSATE SODIUM 100 MG PO CAPS
200.0000 mg | ORAL_CAPSULE | Freq: Every day | ORAL | Status: DC
  Administered 2024-05-22 – 2024-05-25 (×4): 200 mg via ORAL
  Filled 2024-05-21 (×4): qty 2

## 2024-05-21 MED ORDER — ONDANSETRON HCL 4 MG/2ML IJ SOLN
4.0000 mg | Freq: Four times a day (QID) | INTRAMUSCULAR | Status: DC | PRN
  Administered 2024-05-21 – 2024-05-25 (×2): 4 mg via INTRAVENOUS
  Filled 2024-05-21 (×2): qty 2

## 2024-05-21 MED ORDER — DEXTROSE 50 % IV SOLN: 0.0000 mL | INTRAVENOUS | Status: DC | PRN

## 2024-05-21 MED ORDER — ROCURONIUM BROMIDE 10 MG/ML (PF) SYRINGE
PREFILLED_SYRINGE | INTRAVENOUS | Status: AC
  Filled 2024-05-21: qty 10

## 2024-05-21 MED ORDER — ARFORMOTEROL TARTRATE 15 MCG/2ML IN NEBU
15.0000 ug | INHALATION_SOLUTION | Freq: Two times a day (BID) | RESPIRATORY_TRACT | Status: DC
  Administered 2024-05-21 – 2024-05-28 (×14): 15 ug via RESPIRATORY_TRACT
  Filled 2024-05-21 (×14): qty 2

## 2024-05-21 MED ORDER — MIDAZOLAM HCL 5 MG/5ML IJ SOLN
INTRAMUSCULAR | Status: DC | PRN
  Administered 2024-05-21: 2 mg via INTRAVENOUS

## 2024-05-21 MED ORDER — NOREPINEPHRINE 4 MG/250ML-% IV SOLN: 0.0000 ug/min | INTRAVENOUS | Status: DC

## 2024-05-21 MED ORDER — MAGNESIUM SULFATE 4 GM/100ML IV SOLN
4.0000 g | Freq: Once | INTRAVENOUS | Status: AC
  Administered 2024-05-21: 4 g via INTRAVENOUS
  Filled 2024-05-21: qty 100

## 2024-05-21 MED ORDER — ALBUMIN HUMAN 5 % IV SOLN
250.0000 mL | INTRAVENOUS | Status: DC | PRN
  Administered 2024-05-21 (×3): 12.5 g via INTRAVENOUS
  Filled 2024-05-21: qty 250

## 2024-05-21 MED ORDER — METOPROLOL TARTRATE 12.5 MG HALF TABLET
12.5000 mg | ORAL_TABLET | Freq: Two times a day (BID) | ORAL | Status: DC
  Administered 2024-05-21: 12.5 mg via ORAL
  Filled 2024-05-21: qty 1

## 2024-05-21 MED ORDER — OXYCODONE HCL 5 MG PO TABS
5.0000 mg | ORAL_TABLET | ORAL | Status: DC | PRN
  Administered 2024-05-21: 5 mg via ORAL
  Administered 2024-05-22: 10 mg via ORAL
  Filled 2024-05-21: qty 2
  Filled 2024-05-21: qty 1

## 2024-05-21 SURGICAL SUPPLY — 74 items
ADAPTER MULTI PERFUSION 15 (ADAPTER) ×1 IMPLANT
BAG DECANTER FOR FLEXI CONT (MISCELLANEOUS) ×4 IMPLANT
BLADE CLIPPER SURG (BLADE) ×1 IMPLANT
BLADE STERNUM SYSTEM 6 (BLADE) ×2 IMPLANT
BLOWER MISTER CAL-MED (MISCELLANEOUS) ×2 IMPLANT
BNDG ELASTIC 4INX 5YD STR LF (GAUZE/BANDAGES/DRESSINGS) ×2 IMPLANT
BNDG ELASTIC 6INX 5YD STR LF (GAUZE/BANDAGES/DRESSINGS) ×2 IMPLANT
BNDG GAUZE DERMACEA FLUFF 4 (GAUZE/BANDAGES/DRESSINGS) ×2 IMPLANT
CABLE SURGICAL S-101-97-12 (CABLE) IMPLANT
CANISTER SUCTION 3000ML PPV (SUCTIONS) ×2 IMPLANT
CANNULA AORTIC ROOT 9FR (CANNULA) ×1 IMPLANT
CANNULA MC2 2 STG 29/37 NON-V (CANNULA) IMPLANT
CANNULA NON VENT 20FR 12 (CANNULA) IMPLANT
CANNULA VESSEL 3MM BLUNT TIP (CANNULA) ×2 IMPLANT
CLIP CORONARY ARTERY RETRACTOR (MISCELLANEOUS) IMPLANT
CONN ST 1/2X1/2 BEN (MISCELLANEOUS) ×2 IMPLANT
CONNECTOR BLAKE 2:1 CARIO BLK (MISCELLANEOUS) ×2 IMPLANT
DERMABOND ADVANCED .7 DNX12 (GAUZE/BANDAGES/DRESSINGS) ×2 IMPLANT
DRAIN CHANNEL 19F RND (DRAIN) ×4 IMPLANT
DRAIN CONNECTOR BLAKE 1:1 (MISCELLANEOUS) IMPLANT
DRAPE INCISE IOBAN 66X45 STRL (DRAPES) ×2 IMPLANT
DRAPE SRG 135X102X78XABS (DRAPES) ×2 IMPLANT
DRAPE WARM FLUID 44X44 (DRAPES) ×2 IMPLANT
DRESSING AQUACEL AG SP 3.5X10 (GAUZE/BANDAGES/DRESSINGS) ×1 IMPLANT
DRSG COVADERM 4X14 (GAUZE/BANDAGES/DRESSINGS) ×1 IMPLANT
ELECTRODE BLDE 4.0 EZ CLN MEGD (MISCELLANEOUS) ×2 IMPLANT
ELECTRODE REM PT RTRN 9FT ADLT (ELECTROSURGICAL) ×4 IMPLANT
FELT TEFLON 1X6 (MISCELLANEOUS) ×2 IMPLANT
GAUZE SPONGE 4X4 12PLY STRL (GAUZE/BANDAGES/DRESSINGS) ×2 IMPLANT
GLOVE BIO SURGEON STRL SZ7 (GLOVE) ×6 IMPLANT
GLOVE BIOGEL PI IND STRL 7.5 (GLOVE) ×4 IMPLANT
GOWN STRL REUS W/ TWL LRG LVL3 (GOWN DISPOSABLE) ×8 IMPLANT
GOWN STRL REUS W/ TWL XL LVL3 (GOWN DISPOSABLE) ×4 IMPLANT
GOWN STRL SURGICAL XL XLNG (GOWN DISPOSABLE) ×4 IMPLANT
INSERT FOGARTY XLG (MISCELLANEOUS) ×1 IMPLANT
INSERT SUTURE HOLDER (MISCELLANEOUS) ×2 IMPLANT
KIT BASIN OR (CUSTOM PROCEDURE TRAY) ×2 IMPLANT
KIT SUCTION CATH 14FR (SUCTIONS) ×1 IMPLANT
KIT TURNOVER KIT B (KITS) ×2 IMPLANT
KIT VASOVIEW HEMOPRO 2 VH 4000 (KITS) ×2 IMPLANT
LEAD PACING MYOCARDI (MISCELLANEOUS) ×2 IMPLANT
MARKER DISTAL GRAFT W/ HOLDER (MISCELLANEOUS) ×4 IMPLANT
OFFPUMP STABILIZER SUV (MISCELLANEOUS) ×2 IMPLANT
PACK E OPEN HEART (SUTURE) ×2 IMPLANT
PACK OPEN HEART (CUSTOM PROCEDURE TRAY) ×2 IMPLANT
PAD ARMBOARD POSITIONER FOAM (MISCELLANEOUS) ×4 IMPLANT
PAD ELECT DEFIB RADIOL ZOLL (MISCELLANEOUS) ×2 IMPLANT
PENCIL BUTTON HOLSTER BLD 10FT (ELECTRODE) ×2 IMPLANT
POSITIONER ACROBAT-I OFFPUMP (MISCELLANEOUS) ×2 IMPLANT
POSITIONER HEAD DONUT 9IN (MISCELLANEOUS) ×2 IMPLANT
PUNCH AORTIC ROTATE 4.0MM (MISCELLANEOUS) ×2 IMPLANT
SOLN 0.9% NACL POUR BTL 1000ML (IV SOLUTION) ×10 IMPLANT
SOLN STERILE WATER BTL 1000 ML (IV SOLUTION) ×4 IMPLANT
SUPPORT HEART JANKE-BARRON (MISCELLANEOUS) ×1 IMPLANT
SUT ETHIBOND X763 2 0 SH 1 (SUTURE) ×2 IMPLANT
SUT MNCRL AB 3-0 PS2 18 (SUTURE) ×4 IMPLANT
SUT PDS AB 1 CTX 36 (SUTURE) ×4 IMPLANT
SUT PROLENE 4 0 SH DA (SUTURE) ×2 IMPLANT
SUT PROLENE 5 0 C 1 36 (SUTURE) ×4 IMPLANT
SUT PROLENE 7 0 BV 1 (SUTURE) ×1 IMPLANT
SUT PROLENE 7 0 BV1 MDA (SUTURE) ×2 IMPLANT
SUT STEEL 6MS V (SUTURE) ×4 IMPLANT
SUT VIC AB 2-0 CT1 TAPERPNT 27 (SUTURE) ×1 IMPLANT
SUT VIC AB 3-0 X1 27 (SUTURE) ×1 IMPLANT
SYSTEM SAHARA CHEST DRAIN ATS (WOUND CARE) ×2 IMPLANT
TAPE CLOTH SURG 4X10 WHT LF (GAUZE/BANDAGES/DRESSINGS) ×1 IMPLANT
TAPE PAPER 2X10 WHT MICROPORE (GAUZE/BANDAGES/DRESSINGS) ×1 IMPLANT
TAPES RETRACTO (MISCELLANEOUS) ×3 IMPLANT
TOWEL GREEN STERILE (TOWEL DISPOSABLE) ×2 IMPLANT
TOWEL GREEN STERILE FF (TOWEL DISPOSABLE) ×2 IMPLANT
TUBE SUCT INTRACARD DLP 20F (MISCELLANEOUS) IMPLANT
TUBE SUCTION CARDIAC 10FR (CANNULA) IMPLANT
TUBING LAP HI FLOW INSUFFLATIO (TUBING) ×2 IMPLANT
UNDERPAD 30X36 HEAVY ABSORB (UNDERPADS AND DIAPERS) ×2 IMPLANT

## 2024-05-21 NOTE — Plan of Care (Signed)
  Problem: Education: Goal: Knowledge of General Education information will improve Description: Including pain rating scale, medication(s)/side effects and non-pharmacologic comfort measures Outcome: Progressing   Problem: Health Behavior/Discharge Planning: Goal: Ability to manage health-related needs will improve Outcome: Progressing   Problem: Clinical Measurements: Goal: Will remain free from infection Outcome: Progressing Goal: Diagnostic test results will improve Outcome: Progressing Goal: Respiratory complications will improve Outcome: Progressing Goal: Cardiovascular complication will be avoided Outcome: Progressing   Problem: Activity: Goal: Risk for activity intolerance will decrease Outcome: Progressing   Problem: Pain Managment: Goal: General experience of comfort will improve and/or be controlled Outcome: Progressing   Problem: Safety: Goal: Ability to remain free from injury will improve Outcome: Progressing

## 2024-05-21 NOTE — Progress Notes (Signed)
 Post extubation ABG PaO2 61, CCM notified. No new orders, monitor O2 sats, goal is >92%. Patient has been 93-95% since extubation. Will continue to monitor.

## 2024-05-21 NOTE — Progress Notes (Signed)
 Evaluated this evening due to increasing oxygen requirements. With coughing and deep breaths her saturations increased to 95% on NRB. When she is left alone she drops back to ~90%, shallow breaths.   Trial of HHFNC, repeat ABG with PaO2 56. Trial of bipap for recruitement and to support ventilation until she is more awake.  Leita SHAUNNA Gaskins, DO 05/21/24 6:53 PM [facility]  For contact information, see Amion. If no response to pager, please call PCCM consult pager. After hours, 7PM- 7AM, please call Elink.

## 2024-05-21 NOTE — Anesthesia Procedure Notes (Signed)
 Procedure Name: Intubation Date/Time: 05/21/2024 8:10 AM  Performed by: Mannie Krystal LABOR, CRNAPre-anesthesia Checklist: Patient identified, Emergency Drugs available, Suction available and Patient being monitored Patient Re-evaluated:Patient Re-evaluated prior to induction Oxygen Delivery Method: Circle system utilized Preoxygenation: Pre-oxygenation with 100% oxygen Induction Type: IV induction Ventilation: Mask ventilation without difficulty and Oral airway inserted - appropriate to patient size Laryngoscope Size: Miller and 2 Tube type: Oral Tube size: 8.0 mm Number of attempts: 1 Airway Equipment and Method: Stylet and Oral airway Placement Confirmation: ETT inserted through vocal cords under direct vision, positive ETCO2 and breath sounds checked- equal and bilateral Secured at: 22 cm Tube secured with: Tape Dental Injury: Teeth and Oropharynx as per pre-operative assessment

## 2024-05-21 NOTE — Consult Note (Signed)
 NAME:  Natalie Anderson, MRN:  [id number], DOB:  Jun 13, 1946, LOS: 4 ADMISSION DATE:  05/17/2024, CONSULTATION DATE:  10/28 REFERRING MD:  Dr. Shyrl, CHIEF COMPLAINT:  CABG x 2  History of Present Illness:  Patient is a 78 yo F presents to [facility] w/ DMT2, HTN, HLD presents to [facility] on 10/24.  Patient seen by cardiology for ongoing sob and exertional CP over the past 4-5 weeks. Echo lvef 70% w/ grade 1 diastolic dysfunction. Nuc test shwoing large partially reversible anterior defect. On 10/24 take for cath showing proximal stenosis to both RCA and LAD. Admitted post cath. Started on heparin , asa, statin. TCTS consulted and recommended CABG. On 10/28 patient taken to OR CABGx2. Post op intubated/sedate. Pccm consulted.  Pertinent  Medical History   Past Medical History:  Diagnosis Date   Arthritis    Asthma    as a child   Blood transfusion 07/25/1977   Cataract immature    bilateral   Chronic back pain    spondylosis and stenosis   Complication of anesthesia    stopped breathing after anesthesia   COPD (chronic obstructive pulmonary disease) (HCC)    Diabetes mellitus    takes Metformin  bid and Levimir bid and Victoza daily   Dizziness    occasionally   Dry skin    and itchy    Fibroid tumor    GERD (gastroesophageal reflux disease)    takes Protonix  daily   History of shingles 15-11yrs ago   Hyperlipidemia    takes Lipitor occasionally   Hypertension    takes Losartan daily   Impaired hearing    Joint pain    Joint swelling    Migraine    last one 3 wks ago   Nocturia    Osteoporosis    Osteoporosis    Pneumonia    hx of-double as a child   PONV (postoperative nausea and vomiting)    Seasonal allergies    takes Claritin  daily   Shortness of breath    with exertion   Urinary frequency    UTI (lower urinary tract infection)    hx of   Vertigo    hx of   Vitamin D deficiency    takes Vit D 50,000units on Sat     Significant Hospital Events: Including  procedures, antibiotic start and stop dates in addition to other pertinent events   10/24 cath showing severe 2 vessel disease; admitted by cards 10/28 cabg x2  Interim History / Subjective:  See above  Objective   Blood pressure 121/83, pulse 67, temperature 98 F (36.7 C), temperature source Oral, resp. rate 17, height 5' 2.5 (1.588 m), weight 64.4 kg, SpO2 98%.    Vent Mode: SIMV;PSV;PRVC FiO2 (%):  [50 %] 50 % Set Rate:  [16 bmp] 16 bmp Vt Set:  [410 mL] 410 mL PEEP:  [5 cmH20] 5 cmH20 Pressure Support:  [10 cmH20] 10 cmH20 Plateau Pressure:  [14 cmH20] 14 cmH20   Intake/Output Summary (Last 24 hours) at 05/21/2024 1216 Last data filed at 05/21/2024 1115 Gross per 24 hour  Intake 3165.04 ml  Output 650 ml  Net 2515.04 ml   Filed Weights   05/17/24 0856 05/17/24 1607 05/21/24 0629  Weight: 64.4 kg 64.4 kg 64.4 kg    Examination: General: critically ill appearing on mech vent HEENT: MM pink/moist; ETT in place Neuro: sedate CV: s1s2, rate 60s; CT in place PULM:  dim BS bilaterally; on mech vent simv GI: soft, bsx4  active  Extremities: warm/dry, no edema  Skin: no rashes or lesions   Resolved Hospital Problem list     Assessment & Plan:  Postoperative vent management COPD?: patient states she has hx on prn albuterol; never smoker but exposed through second hand smoke Plan: - CXR to confirm ETT and CT position - Continue full vent support (4-8cc/kg IBW) - Wean FiO2 for O2 sat > 90% - Daily WUA/SBT, rapid wean with SIMV per protocol - VAP bundle - Pulmonary hygiene - F/u ABG - PAD protocol for sedation: Precedex for goal RASS 0 to -1 - prn duoneb  S/p 2-vessel CABG CAD HTN HLD Plan: - cont levo for map >65 - albumin per protocol - f/u post op cbc, pt, ptt, inr - ancef /vanc surgical ppx - Continue ASA and statin - wean insulin  gtt per protocol - Postoperative care per TCTS - CT management per protocol  DMT2 -A1c 6.1 on 10/24 Plan: -insulin  gtt  post op -cbg monitoring  Mildly elevated creat Plan: -Trend BMP / urinary output -Replace electrolytes as indicated -Avoid nephrotoxic agents, ensure adequate renal perfusion  Anemia Plan: -trend cbc  Peripheral neuropathy Plan: -consider resuming home gabapentin  tomorrow   Best Practice (right click and Reselect all SmartList Selections daily)   Diet/type: NPO w/ meds via tube DVT prophylaxis: SCD GI prophylaxis: PPI Lines: Central line and Arterial Line Foley:  Yes, and it is still needed Code Status:  full code Last date of multidisciplinary goals of care discussion [per primary]  Labs   CBC: Recent Labs  Lab 05/18/24 0545 05/19/24 0304 05/20/24 0353 05/21/24 0401 05/21/24 0822 05/21/24 0955 05/21/24 1109 05/21/24 1112  WBC 5.8 6.9 6.6 7.2  --   --   --   --   HGB 10.8* 10.8* 10.4* 10.8* 10.5* 9.9* 8.8* 10.2*  HCT 32.7* 32.4* 31.7* 32.7* 31.0* 29.0* 26.0* 30.0*  MCV 79.6* 79.0* 79.4* 80.5  --   --   --   --   PLT 361 368 371 349  --   --   --   --     Basic Metabolic Panel: Recent Labs  Lab 05/20/24 1025 05/21/24 0401 05/21/24 0822 05/21/24 0955 05/21/24 1109 05/21/24 1112  NA 139 137 136 134* 135 136  K 4.5 3.9 4.0 4.0 4.0 4.0  CL 101 102 100 100  --  101  CO2 27 23  --   --   --   --   GLUCOSE 83 102* 193* 159*  --  130*  BUN 12 13 12 12   --  11  CREATININE 1.09* 1.08* 1.10* 0.90  --  0.90  CALCIUM  9.7 9.1  --   --   --   --    GFR: Estimated Creatinine Clearance: 45.9 mL/min (by C-G formula based on SCr of 0.9 mg/dL). Recent Labs  Lab 05/18/24 0545 05/19/24 0304 05/20/24 0353 05/21/24 0401  WBC 5.8 6.9 6.6 7.2    Liver Function Tests: No results for input(s): AST, ALT, ALKPHOS, BILITOT, PROT, ALBUMIN in the last 168 hours. No results for input(s): LIPASE, AMYLASE in the last 168 hours. No results for input(s): AMMONIA in the last 168 hours.  ABG    Component Value Date/Time   PHART 7.324 (L) 05/21/2024 1109    PCO2ART 41.7 05/21/2024 1109   PO2ART 154 (H) 05/21/2024 1109   HCO3 21.6 05/21/2024 1109   TCO2 20 (L) 05/21/2024 1112   ACIDBASEDEF 4.0 (H) 05/21/2024 1109   O2SAT 99 05/21/2024 1109  Coagulation Profile: Recent Labs  Lab 05/20/24 0353  INR 1.0    Cardiac Enzymes: No results for input(s): CKTOTAL, CKMB, CKMBINDEX, TROPONINI in the last 168 hours.  HbA1C: Hgb A1c MFr Bld  Date/Time Value Ref Range Status  05/17/2024 05:08 PM 6.1 (H) 4.8 - 5.6 % Final    Comment:    (NOTE) Diagnosis of Diabetes The following HbA1c ranges recommended by the American Diabetes Association (ADA) may be used as an aid in the diagnosis of diabetes mellitus.  Hemoglobin             Suggested A1C NGSP%              Diagnosis  <5.7                   Non Diabetic  5.7-6.4                Pre-Diabetic  >6.4                   Diabetic  <7.0                   Glycemic control for                       adults with diabetes.      CBG: Recent Labs  Lab 05/20/24 0759 05/20/24 1144 05/20/24 1606 05/20/24 2054 05/21/24 0547  GLUCAP 267* 73 201* 100* 173*    Review of Systems:   Patient is intubated; therefore, history has been obtained from chart review.    Past Medical History:  She,  has a past medical history of Arthritis, Asthma, Blood transfusion (07/25/1977), Cataract immature, Chronic back pain, Complication of anesthesia, COPD (chronic obstructive pulmonary disease) (HCC), Diabetes mellitus, Dizziness, Dry skin, Fibroid tumor, GERD (gastroesophageal reflux disease), History of shingles (15-74yrs ago), Hyperlipidemia, Hypertension, Impaired hearing, Joint pain, Joint swelling, Migraine, Nocturia, Osteoporosis, Osteoporosis, Pneumonia, PONV (postoperative nausea and vomiting), Seasonal allergies, Shortness of breath, Urinary frequency, UTI (lower urinary tract infection), Vertigo, and Vitamin D deficiency.   Surgical History:   Past Surgical History:  Procedure  Laterality Date   BACK SURGERY  12/24/2010   x2   CATARACT EXTRACTION W/PHACO Right 04/05/2021   Procedure: CATARACT EXTRACTION PHACO AND INTRAOCULAR LENS PLACEMENT (IOC);  Surgeon: Harrie Agent, MD;  Location: [facility];  Service: Ophthalmology;  Laterality: Right;  CDE 5.98   CATARACT EXTRACTION W/PHACO Left 04/19/2021   Procedure: CATARACT EXTRACTION PHACO AND INTRAOCULAR LENS PLACEMENT LEFT EYE;  Surgeon: Harrie Agent, MD;  Location: [facility];  Service: Ophthalmology;  Laterality: Left;  left CDE=5.90   COLONOSCOPY WITH PROPOFOL  N/A 12/09/2021   Surgeon: Shaaron Lamar HERO, MD; Six 3 to 9 mm polyps removed and 1 hemostasis clip placed.  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  Recommended repeat in 3 years.   DILATION AND CURETTAGE OF UTERUS  07/25/1977   ESOPHAGOGASTRODUODENOSCOPY (EGD) WITH PROPOFOL  N/A 12/09/2021   Surgeon: Shaaron Lamar HERO, MD; Moderate Schatzki ring. Dilated. Small hiatal hernia.   HEMOSTASIS CLIP PLACEMENT  12/09/2021   Procedure: HEMOSTASIS CLIP PLACEMENT;  Surgeon: Shaaron Lamar HERO, MD;  Location: [facility];  Service: Endoscopy;;   LEFT HEART CATH AND CORONARY ANGIOGRAPHY N/A 05/17/2024   Procedure: LEFT HEART CATH AND CORONARY ANGIOGRAPHY;  Surgeon: Anner Alm ORN, MD;  Location: [facility] INVASIVE CV LAB;  Service: Cardiovascular;  Laterality: N/A;   MALONEY DILATION N/A 12/09/2021   Procedure:  MALONEY DILATION;  Surgeon: Shaaron Lamar HERO, MD;  Location: [facility];  Service: Endoscopy;  Laterality: N/A;   POLYPECTOMY  12/09/2021   Procedure: POLYPECTOMY;  Surgeon: Shaaron Lamar HERO, MD;  Location: [facility];  Service: Endoscopy;;     Social History:   reports that she has never smoked. She has never used smokeless tobacco. She reports that she does not drink alcohol  and does not use drugs.   Family History:  Her family history includes Breast cancer in her mother; Colon cancer in her paternal grandmother; Colon cancer (age of  onset: 80) in her cousin and cousin; Colon cancer (age of onset: 22) in her cousin; Colon cancer (age of onset: 29) in her nephew; Colon cancer (age of onset: 46) in her niece; Colon polyps in her sister; Lung cancer in her brother and mother. There is no history of Anesthesia problems, Hypotension, Malignant hyperthermia, or Pseudochol deficiency.   Allergies Allergies  Allergen Reactions   Shellfish Allergy Shortness Of Breath    Shrimp allergy   Latex Rash    Blisters    Morphine  And Codeine Itching and Nausea And Vomiting   Other Hives and Rash    Strawberries and oranges    Amoxicillin Other (See Comments)    Caused mouth sores, and Gastric issues    Prednisone     Makes me bleed out of my mouth   Nickel Rash   Penicillins Hives     Home Medications  Prior to Admission medications   Medication Sig Start Date End Date Taking? Authorizing Provider  albuterol (PROVENTIL HFA;VENTOLIN HFA) 108 (90 BASE) MCG/ACT inhaler Inhale 2 puffs into the lungs every 6 (six) hours as needed for wheezing or shortness of breath.   Yes [provider]  amLODipine (NORVASC) 5 MG tablet Take 5 mg by mouth at bedtime.   Yes [provider]  aspirin EC 81 MG tablet Take 81 mg by mouth at bedtime. Swallow whole.   Yes [provider]  Cholecalciferol (VITAMIN D-3) 125 MCG (5000 UT) TABS Take 5,000 Units by mouth at bedtime.   Yes [provider]  fluticasone  (FLONASE) 50 MCG/ACT nasal spray Place 1 spray into both nostrils in the morning and at bedtime. 01/03/24  Yes [provider]  gabapentin  (NEURONTIN ) 300 MG capsule Take 600 mg by mouth 3 (three) times daily. Patient taking differently: Take 600 mg by mouth 2 (two) times daily.   Yes [provider]  loratadine  (CLARITIN ) 10 MG tablet Take 10 mg by mouth daily.   Yes [provider]  losartan (COZAAR) 100 MG tablet Take 100 mg by mouth in the morning.   Yes [provider]   metFORMIN  (GLUCOPHAGE ) 500 MG tablet Take 1,000 mg by mouth 2 (two) times daily with a meal.   Yes [provider]  MOUNJARO 2.5 MG/0.5ML Pen Inject 2.5 mg into the skin once a week. Fridays 12/06/23  Yes [provider]  pantoprazole  (PROTONIX ) 20 MG tablet Take 1 tablet (20 mg total) by mouth daily. 01/05/24  Yes Rourk, Lamar HERO, MD  rosuvastatin (CRESTOR) 5 MG tablet Take 5 mg by mouth once a week. Wednesdays 04/02/24  Yes [provider]  TRESIBA FLEXTOUCH 200 UNIT/ML FlexTouch Pen Inject 40 Units into the skin in the morning. 03/11/21  Yes [provider]     Critical care time: 45 minutes    JD Emilio DEVONNA [facility] 05/21/2024, 12:16 PM  Please see [email or web address]  for pager details.  From 7A-7P if no response, please call [phone]. After hours, please call ELink [phone].

## 2024-05-21 NOTE — Progress Notes (Signed)
     [address]       [place] [address]             [phone]       No events Vitals:   05/21/24 0524 05/21/24 0629  BP: 131/81 (!) 153/70  Pulse: 100 92  Resp: 20 18  Temp: 98.4 F (36.9 C) 98 F (36.7 C)  SpO2: 99% 97%   Alert NAD Sinus EWOB  OR for CABG

## 2024-05-21 NOTE — Progress Notes (Signed)
   [address], [place] [address]             [phone]    S/p Off pump CABG x 2  Extubated  BP 128/75   Pulse 88   Temp 98.2 F (36.8 C)   Resp 16   Ht 5' 2.5 (1.588 m)   Wt 64.4 kg   SpO2 90%   BMI 25.55 kg/m  4L [place] 93% sat CI 2.5, CVP 10   Intake/Output Summary (Last 24 hours) at 05/21/2024 1812 Last data filed at 05/21/2024 1700 Gross per 24 hour  Intake 3692.22 ml  Output 1008 ml  Net 2684.22 ml   K 4.1 Hgb 7.8 Minimal CT output  Oris Staffieri C. Kerrin, MD [facility] [phone]

## 2024-05-21 NOTE — Op Note (Signed)
     [address]       [place] [address]             [phone]                                         05/21/2024    Patient:  Natalie Anderson Pre-Op Dx: LILLA CAD     HLP   HTN   COPD  Post-op Dx:  same Procedure: Off pump CABG X 2.  LIMA LAD, RSVG distal RCA   Endoscopic greater saphenous vein harvest on the left   Surgeon and Role:      * Sondra Blixt, Linnie KIDD, MD - Primary    DEWAINE MICAEL Cera, PA-C - assisting  An experienced assistant was required given the complexity of this surgery and the standard of surgical care. The assistant was needed for exposure, dissection, suctioning, retraction of delicate tissues and sutures, instrument exchange and for overall help during this procedure.     Anesthesia  general EBL:  Blood Administration: none  Drains: 19 F blake drain: L, mediastinal  Wires: V Counts: correct   Indications: 78yo female with 2V CAD.  Echocardiogram shows preserved biventricular function and no significant valvular disease.  Will plan for off-pump CABG 2.  Findings: Small LIMA, very small vein.  Good LAD and distal RCA  Operative Technique: After the risks, benefits and alternatives were thoroughly discussed, the patient was brought to the operative theatre.  All invasive lines were placed in pre-op holding.  Anesthesia was induced, and the patient was prepped and draped in normal sterile fashion.  An appropriate surgical pause was performed, and pre-operative antibiotics were dosed accordingly.  We began with simultaneous incisions along the left leg for harvesting of the greater saphenous vein and the chest for the sternotomy.  In regards to the sternotomy, this was carried down with bovie cautery, and the sternum was divided with a reciprocating saw.  Meticulous hemostasis was obtained.  The left internal thoracic artery was exposed and harvested in in pedicled fashion.  The patient was systemically heparinized, and the artery was  divided distally, and placed in a papaverine sponge.    The sternal elevator was removed, and a retractor was placed.  The pericardium was divided in the midline and fashioned into a cradle with pericardial stitches.     Next, we exposed a good target on the  LAD, and fashioned an end to side anastomosis between it and the LITA.  We next exposed the posterior wall and identified a good target on distal RCA.  An end to side anastomosis between it and reverse saphenous vein.  Meticulous hemostasis was obtained.    A partial occludding clamp was then placed on the ascending aorta, and we created an end to side anastomosis between it and the proximal vein graft.  The proximal sitewas marked with a ring.  Hemostasis was obtained, and the heparin  was reversed with protamine.  Chest tubes and wires were placed, and the sternum was re-approximated with with sternal wires.  The soft tissue and skin were re-approximated wth absorbable suture.    The patient tolerated the procedure without any immediate complications, and was transferred to the ICU in guarded condition.  Sarie Stall KIDD Rayas

## 2024-05-21 NOTE — Transfer of Care (Signed)
 Immediate Anesthesia Transfer of Care Note  Patient: Natalie Anderson  Procedure(s) Performed: OFF PUMP CORONARY ARTERY BYPASS GRAFTING X 2, USING LEFT INTERNAL MAMMARY ARTERY AND ENDOSCOPIC HARVESTED LEFT GREATER SAPHENOUS VEIN (Chest)  Patient Location: SICU  Anesthesia Type:General  Level of Consciousness: sedated and Patient remains intubated per anesthesia plan  Airway & Oxygen Therapy: Patient remains intubated per anesthesia plan and Patient placed on Ventilator (see vital sign flow sheet for setting)  Post-op Assessment: Report given to RN and Post -op Vital signs reviewed and stable  Post vital signs: Reviewed and stable  Last Vitals:  Vitals Value Taken Time  BP    Temp    Pulse 67 05/21/24 12:02  Resp 16 05/21/24 12:02  SpO2 98 % 05/21/24 12:02  Vitals shown include unfiled device data.  Last Pain:  Vitals:   05/21/24 0714  TempSrc:   PainSc: 0-No pain      Patients Stated Pain Goal: 0 (05/21/24 0714)  Complications: No notable events documented.

## 2024-05-21 NOTE — Progress Notes (Signed)
 Interim CCM Progress Note Patient extubated successfully. Despite increasing nitroglycerin gtt patient still hypertensive- not meeting SBP goal of 100.  P: Start cleviprex, SBP goal of 100, MAP >65  Continue to monitor BP per aline   Christian Cleda Imel AGACNP-BC   [facility] 05/21/2024, 5:34 PM  Please see [email or web address] for pager details.  From 7A-7P if no response, please call [phone]. After hours, please call ELink [phone].

## 2024-05-21 NOTE — Hospital Course (Addendum)
 Referring: Alm Clay, MD Primary Care: Maree Isles, MD Primary Cardiologist:Branch, Dorn, MD     History of Present Illness: At time of CT surgical consultation   [name] Natalie Anderson is a 78 year old female with a past history notable for hypertension, dyslipidemia, type 2 diabetes mellitus, chronic back pain, gastroesophageal reflux disease.  She is a lifelong non-smoker but was exposed to second hand smoke in her home for 50 years and said she has COPD as a result.  She was recently evaluated by her PCP, Dr. Loreli, for exertional chest pain with shortness of breath ongoing for 4-5 weeks.  Outpatient evaluation included an echocardiogram showing an ejection fraction of greater than 70% with no wall motion abnormality.  There was grade 1 diastolic dysfunction but no valvular pathology.  She went on to have a nuclear stress test showing a large partially reversible anterior defect.  This prompted referral to Dr. Alvan, cardiologist, for further evaluation.  He recommended left heart catheterization which was performed earlier today demonstrating proximal stenoses in both the RCA and LAD as detailed below.   [name] describes having worsening chest pain on a daily basis that occurs now with minimal exertion such as just walking between 2 rooms or bending down to pick up something off the floor.   [name] is widowed but has continued to live alone and care for herself for the past 15 years since her husband died.  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  She states she had good family support and would have assistance after she leaves the hospital.   Past surgical history is notable for 2 back surgeries for herniated disc with residual chronic weakness in her right lower extremity.  She also has an esophageal stricture that has been dilated on the occasions and she said it  was due to be done again in the near future.  Following full review of the patient and all relevant studies Dr. Shyrl recommended CABG.  Hospital course:  Following completion of her diagnostic workup and medical stabilization she was felt to be stable to proceed and on 05/21/2024 she was taken the operating room at which time she underwent off-pump CABG x 2. LIMA-LAD and a saphenous vein-RCA graft were placed.  Patient tolerated the procedure well was taken to the surgical intensive care unit in stable condition.  Postoperative hospital course:  Patient was extubated using standard postcardiac surgical protocols without difficulty.  She did however require early initiation of BiPAP for poor oxygenation.  This has improved over time.  By postop day 2 she was on high flow heated nasal cannula with BiPAP at night.  Earlier this was associated with some metabolic acidosis which is also resolving.  She did require Cleviprex for blood pressure management.  This was able to be weaned and she has been started on oral metoprolol as well as aspirin and statin.  She did require some additional amps of bicarb.  The critical care service was consulted to assist with ICU management.  She did have some expected postoperative volume overload and is been diuresed accordingly.  She does have an AKI and creatinine peaked at 1.64.  This recent value was 1.18.  On postop day 3 she continued to require high flow nasal cannula at 8 L.  She also continued with nocturnal BiPAP.  Her respiratory status improved.  She was felt stable for  transfer to the progressive care unit on 05/25/2024.  The patient had issues with nausea and scheduled reglan was added to provide relief.  She was tachycardic and her lopressor as titrated as tolerated to obtain better HR control.  She has had some elevated blood pressure readings and Norvasc was resumed.  ARB has not been resumed due to the recent AKI and she is noted to have good blood  pressure control currently.  Of asked PT and OT to evaluate for home needs.  Home equipment has been arranged.  Incisions are healing well without evidence of infection.  She is tolerating diet although not eating normal amounts due to some occasional nausea and lack of appetite.  Sugars have been under good control.  She will be started on a lower home dose of insulin  than previously and she has the ability to home titrate as she is very familiar with adjusting dosing based on blood sugars.  And she will be able she is off oxygen and maintaining good saturations on room air.  Home inhalers will be resumed.  Overall, at the time of discharge the patient is felt to be quite stable.

## 2024-05-21 NOTE — Anesthesia Procedure Notes (Signed)
 Arterial Line Insertion Start/End10/28/2025 6:55 AM, 05/21/2024 7:00 AM Performed by: Leopoldo Bruckner, MD, Mannie Krystal LABOR, CRNA, CRNA  Patient location: Pre-op. Preanesthetic checklist: patient identified, IV checked, site marked, risks and benefits discussed, surgical consent, monitors and equipment checked, pre-op evaluation, timeout performed and anesthesia consent Lidocaine  1% used for infiltration Right, radial was placed Catheter size: 20 G Hand hygiene performed , maximum sterile barriers used  and Seldinger technique used Allen's test indicative of satisfactory collateral circulation Attempts: 1 Following insertion, Biopatch and dressing applied. Post procedure assessment: normal  Patient tolerated the procedure well with no immediate complications.

## 2024-05-21 NOTE — Procedures (Signed)
 Extubation Procedure Note  Patient Details:   Name: Natalie Anderson DOB: 12-22-1945 MRN: [id number]   Airway Documentation:    Vent end date: 05/21/24 Vent end time: 1555   Evaluation  O2 sats: stable throughout Complications: No apparent complications Patient did tolerate procedure well. Bilateral Breath Sounds: Diminished, Clear   Yes  Patient extubated per rapid heart wean protocol. Positive cuff leak. No stridor noted. NIF -21, VC 0.6L. Vitals are stable on 4L [place]. RN at bedside.  Areanna Gengler H Mccartney Chuba 05/21/2024, 4:05 PM

## 2024-05-21 NOTE — Progress Notes (Signed)
 Notified Hendrickson MD about arterial line not working and states he would like another. RRT attempted twice. Elink RN notified for CCM placement.  Hobert Sink RN

## 2024-05-21 NOTE — Anesthesia Preprocedure Evaluation (Signed)
 Anesthesia Evaluation  Patient identified by MRN, date of birth, ID band Patient awake    Reviewed: Allergy & Precautions, NPO status , Patient's Chart, lab work & pertinent test results  History of Anesthesia Complications (+) PONV and history of anesthetic complications  Airway Mallampati: III  TM Distance: >3 FB Neck ROM: Full    Dental  (+) Edentulous Upper, Dental Advisory Given, Missing,    Pulmonary shortness of breath, asthma , COPD   breath sounds clear to auscultation       Cardiovascular hypertension, Pt. on medications + angina  + CAD   Rhythm:Regular    Mid LM to Ost LAD lesion is 30% stenosed with 20% stenosed side branch in Ost Cx to Prox Cx.   Ost LAD to Prox LAD lesion is 99% stenosed. Prox LAD to Mid LAD lesion is 95% stenosed. Mid LAD lesion is 70% stenosed with 30% stenosed side branch in 1st Diag.  Distally the LAD has TIMI II flow.  The LAD branches into a septal perforator, interventricular branch and major diagonal branch which reaches the apex.   Ost RCA lesion is 60% stenosed. Prox RCA-1 lesion is 70% stenosed. Prox RCA-2 lesion is 50% stenosed.   Remainder the LCx and RCA relatively free of disease.   -----------------------------------------   LV end diastolic pressure is normal.    Neuro/Psych  Headaches, neg Seizures    GI/Hepatic ,GERD  Medicated and Controlled,,  Endo/Other  diabetes    Renal/GU      Musculoskeletal  (+) Arthritis ,    Abdominal   Peds  Hematology   Anesthesia Other Findings  h/o schatzki's ring s/p dilation in 2023  Reproductive/Obstetrics                              Anesthesia Physical Anesthesia Plan  ASA: 4  Anesthesia Plan: General   Post-op Pain Management:    Induction: Intravenous  PONV Risk Score and Plan: 4 or greater and Ondansetron   Airway Management Planned: Oral ETT  Additional Equipment: Arterial line, CVP  and Ultrasound Guidance Line Placement  Intra-op Plan:   Post-operative Plan: Post-operative intubation/ventilation  Informed Consent: I have reviewed the patients History and Physical, chart, labs and discussed the procedure including the risks, benefits and alternatives for the proposed anesthesia with the patient or authorized representative who has indicated his/her understanding and acceptance.     Dental advisory given  Plan Discussed with: CRNA  Anesthesia Plan Comments:          Anesthesia Quick Evaluation

## 2024-05-21 NOTE — Anesthesia Procedure Notes (Signed)
 Central Venous Catheter Insertion Performed by: Leopoldo Bruckner, MD, anesthesiologist Start/End10/28/2025 7:01 AM, 05/21/2024 7:17 AM Patient location: Pre-op. Preanesthetic checklist: patient identified, IV checked, site marked, risks and benefits discussed, surgical consent, monitors and equipment checked, pre-op evaluation, timeout performed and anesthesia consent Lidocaine  1% used for infiltration and patient sedated Hand hygiene performed  and maximum sterile barriers used  Catheter size: 8 Fr Total catheter length 16. Central line was placed.Double lumen Procedure performed using ultrasound to evaluate access site. Ultrasound Notes:image(s) printed for medical record. Attempts: 1 Following insertion, dressing applied, line sutured and Biopatch. Post procedure assessment: blood return through all ports, free fluid flow and no air  Patient tolerated the procedure well with no immediate complications.

## 2024-05-21 NOTE — Brief Op Note (Signed)
 05/17/2024 - 05/21/2024  1:47 PM  PATIENT:  Natalie Anderson  78 y.o. female  PRE-OPERATIVE DIAGNOSIS:  CORONARY ARTERY DISEASE  POST-OPERATIVE DIAGNOSIS:  CORONARY ARTERY DISEASE  PROCEDURE:  Procedure(s): OFF PUMP CORONARY ARTERY BYPASS GRAFTING X 2, USING LEFT INTERNAL MAMMARY ARTERY AND ENDOSCOPIC HARVESTED LEFT GREATER SAPHENOUS VEIN (N/A) Vein harvest time: Vein prep time: LIMA-LAD, SVG-RCA  SURGEON:  Surgeons and Role:    * Lightfoot, Linnie KIDD, MD - Primary  PHYSICIAN ASSISTANT: Aragon Scarantino PA-C  ASSISTANTS: LUKE STACKS RNFA   ANESTHESIA:   general  EBL:  100 mL   BLOOD ADMINISTERED:none  DRAINS: LEFT PLEURAL AND MEDIASTINAL CHEST TUBES   LOCAL MEDICATIONS USED:  NONE  SPECIMEN:  No Specimen  DISPOSITION OF SPECIMEN:  N/A  COUNTS:  YES  TOURNIQUET:  * No tourniquets in log *  DICTATION: .Dragon Dictation  PLAN OF CARE: Admit to inpatient   PATIENT DISPOSITION:  ICU - intubated and hemodynamically stable.   Delay start of Pharmacological VTE agent (>24hrs) due to surgical blood loss or risk of bleeding: yes  COMPLICATIONS: NO KNOWN

## 2024-05-21 NOTE — Progress Notes (Signed)
 Attempted to get A-line without success. RN aware.

## 2024-05-22 ENCOUNTER — Inpatient Hospital Stay (HOSPITAL_COMMUNITY)

## 2024-05-22 ENCOUNTER — Telehealth (HOSPITAL_COMMUNITY): Payer: Self-pay

## 2024-05-22 ENCOUNTER — Encounter (HOSPITAL_COMMUNITY): Payer: Self-pay | Admitting: Thoracic Surgery (Cardiothoracic Vascular Surgery)

## 2024-05-22 DIAGNOSIS — J9600 Acute respiratory failure, unspecified whether with hypoxia or hypercapnia: Secondary | ICD-10-CM | POA: Diagnosis not present

## 2024-05-22 DIAGNOSIS — Z452 Encounter for adjustment and management of vascular access device: Secondary | ICD-10-CM | POA: Diagnosis not present

## 2024-05-22 DIAGNOSIS — Z951 Presence of aortocoronary bypass graft: Secondary | ICD-10-CM | POA: Diagnosis not present

## 2024-05-22 DIAGNOSIS — Z48812 Encounter for surgical aftercare following surgery on the circulatory system: Secondary | ICD-10-CM | POA: Diagnosis not present

## 2024-05-22 DIAGNOSIS — I10 Essential (primary) hypertension: Secondary | ICD-10-CM | POA: Diagnosis not present

## 2024-05-22 DIAGNOSIS — I200 Unstable angina: Secondary | ICD-10-CM | POA: Diagnosis not present

## 2024-05-22 DIAGNOSIS — E8721 Acute metabolic acidosis: Secondary | ICD-10-CM | POA: Diagnosis not present

## 2024-05-22 DIAGNOSIS — R918 Other nonspecific abnormal finding of lung field: Secondary | ICD-10-CM | POA: Diagnosis not present

## 2024-05-22 LAB — POCT I-STAT 7, (LYTES, BLD GAS, ICA,H+H)
Acid-base deficit: 7 mmol/L — ABNORMAL HIGH (ref 0.0–2.0)
Acid-base deficit: 10 mmol/L — ABNORMAL HIGH (ref 0.0–2.0)
Acid-base deficit: 5 mmol/L — ABNORMAL HIGH (ref 0.0–2.0)
Bicarbonate: 18.6 mmol/L — ABNORMAL LOW (ref 20.0–28.0)
Bicarbonate: 15.9 mmol/L — ABNORMAL LOW (ref 20.0–28.0)
Bicarbonate: 20.2 mmol/L (ref 20.0–28.0)
Calcium, Ion: 1.19 mmol/L (ref 1.15–1.40)
Calcium, Ion: 1.09 mmol/L — ABNORMAL LOW (ref 1.15–1.40)
Calcium, Ion: 1.11 mmol/L — ABNORMAL LOW (ref 1.15–1.40)
HCT: 27 % — ABNORMAL LOW (ref 36.0–46.0)
HCT: 24 % — ABNORMAL LOW (ref 36.0–46.0)
HCT: 25 % — ABNORMAL LOW (ref 36.0–46.0)
Hemoglobin: 9.2 g/dL — ABNORMAL LOW (ref 12.0–15.0)
Hemoglobin: 8.2 g/dL — ABNORMAL LOW (ref 12.0–15.0)
Hemoglobin: 8.5 g/dL — ABNORMAL LOW (ref 12.0–15.0)
O2 Saturation: 89 %
O2 Saturation: 97 %
O2 Saturation: 97 %
Patient temperature: 99.1
Patient temperature: 98.3
Potassium: 4.1 mmol/L (ref 3.5–5.1)
Potassium: 3.4 mmol/L — ABNORMAL LOW (ref 3.5–5.1)
Potassium: 6.6 mmol/L (ref 3.5–5.1)
Sodium: 132 mmol/L — ABNORMAL LOW (ref 135–145)
Sodium: 135 mmol/L (ref 135–145)
Sodium: 131 mmol/L — ABNORMAL LOW (ref 135–145)
TCO2: 20 mmol/L — ABNORMAL LOW (ref 22–32)
TCO2: 17 mmol/L — ABNORMAL LOW (ref 22–32)
TCO2: 21 mmol/L — ABNORMAL LOW (ref 22–32)
pCO2 arterial: 37.4 mmHg (ref 32–48)
pCO2 arterial: 33.6 mmHg (ref 32–48)
pCO2 arterial: 35.9 mmHg (ref 32–48)
pH, Arterial: 7.305 — ABNORMAL LOW (ref 7.35–7.45)
pH, Arterial: 7.284 — ABNORMAL LOW (ref 7.35–7.45)
pH, Arterial: 7.358 (ref 7.35–7.45)
pO2, Arterial: 63 mmHg — ABNORMAL LOW (ref 83–108)
pO2, Arterial: 98 mmHg (ref 83–108)
pO2, Arterial: 90 mmHg (ref 83–108)

## 2024-05-22 LAB — BASIC METABOLIC PANEL WITH GFR
Anion gap: 10 (ref 5–15)
Anion gap: 10 (ref 5–15)
BUN: 16 mg/dL (ref 8–23)
BUN: 21 mg/dL (ref 8–23)
CO2: 20 mmol/L — ABNORMAL LOW (ref 22–32)
CO2: 20 mmol/L — ABNORMAL LOW (ref 22–32)
Calcium: 8.3 mg/dL — ABNORMAL LOW (ref 8.9–10.3)
Calcium: 8 mg/dL — ABNORMAL LOW (ref 8.9–10.3)
Chloride: 100 mmol/L (ref 98–111)
Chloride: 99 mmol/L (ref 98–111)
Creatinine, Ser: 1.29 mg/dL — ABNORMAL HIGH (ref 0.44–1.00)
Creatinine, Ser: 1.67 mg/dL — ABNORMAL HIGH (ref 0.44–1.00)
GFR, Estimated: 42 mL/min — ABNORMAL LOW (ref >60)
GFR, Estimated: 31 mL/min — ABNORMAL LOW (ref >60)
Glucose, Bld: 145 mg/dL — ABNORMAL HIGH (ref 70–99)
Glucose, Bld: 128 mg/dL — ABNORMAL HIGH (ref 70–99)
Potassium: 4.1 mmol/L (ref 3.5–5.1)
Potassium: 6.5 mmol/L (ref 3.5–5.1)
Sodium: 130 mmol/L — ABNORMAL LOW (ref 135–145)
Sodium: 129 mmol/L — ABNORMAL LOW (ref 135–145)

## 2024-05-22 LAB — CBC
HCT: 28.6 % — ABNORMAL LOW (ref 36.0–46.0)
HCT: 27.1 % — ABNORMAL LOW (ref 36.0–46.0)
Hemoglobin: 9.4 g/dL — ABNORMAL LOW (ref 12.0–15.0)
Hemoglobin: 8.9 g/dL — ABNORMAL LOW (ref 12.0–15.0)
MCH: 26.8 pg (ref 26.0–34.0)
MCH: 26.6 pg (ref 26.0–34.0)
MCHC: 32.9 g/dL (ref 30.0–36.0)
MCHC: 32.8 g/dL (ref 30.0–36.0)
MCV: 81.5 fL (ref 80.0–100.0)
MCV: 80.9 fL (ref 80.0–100.0)
Platelets: 374 K/uL (ref 150–400)
Platelets: 314 K/uL (ref 150–400)
RBC: 3.51 MIL/uL — ABNORMAL LOW (ref 3.87–5.11)
RBC: 3.35 MIL/uL — ABNORMAL LOW (ref 3.87–5.11)
RDW: 13.5 % (ref 11.5–15.5)
RDW: 13.6 % (ref 11.5–15.5)
WBC: 17.6 K/uL — ABNORMAL HIGH (ref 4.0–10.5)
WBC: 15.8 K/uL — ABNORMAL HIGH (ref 4.0–10.5)
nRBC: 0 % (ref 0.0–0.2)
nRBC: 0 % (ref 0.0–0.2)

## 2024-05-22 LAB — RENAL FUNCTION PANEL
Albumin: 3.1 g/dL — ABNORMAL LOW (ref 3.5–5.0)
Anion gap: 12 (ref 5–15)
BUN: 23 mg/dL (ref 8–23)
CO2: 23 mmol/L (ref 22–32)
Calcium: 7.5 mg/dL — ABNORMAL LOW (ref 8.9–10.3)
Chloride: 99 mmol/L (ref 98–111)
Creatinine, Ser: 1.53 mg/dL — ABNORMAL HIGH (ref 0.44–1.00)
GFR, Estimated: 35 mL/min — ABNORMAL LOW (ref >60)
Glucose, Bld: 110 mg/dL — ABNORMAL HIGH (ref 70–99)
Phosphorus: 3.7 mg/dL (ref 2.5–4.6)
Potassium: 4 mmol/L (ref 3.5–5.1)
Sodium: 134 mmol/L — ABNORMAL LOW (ref 135–145)

## 2024-05-22 LAB — MAGNESIUM
Magnesium: 2.4 mg/dL (ref 1.7–2.4)
Magnesium: 2.2 mg/dL (ref 1.7–2.4)

## 2024-05-22 LAB — GLUCOSE, CAPILLARY
Glucose-Capillary: 118 mg/dL — ABNORMAL HIGH (ref 70–99)
Glucose-Capillary: 118 mg/dL — ABNORMAL HIGH (ref 70–99)
Glucose-Capillary: 129 mg/dL — ABNORMAL HIGH (ref 70–99)
Glucose-Capillary: 130 mg/dL — ABNORMAL HIGH (ref 70–99)
Glucose-Capillary: 132 mg/dL — ABNORMAL HIGH (ref 70–99)
Glucose-Capillary: 135 mg/dL — ABNORMAL HIGH (ref 70–99)
Glucose-Capillary: 137 mg/dL — ABNORMAL HIGH (ref 70–99)
Glucose-Capillary: 139 mg/dL — ABNORMAL HIGH (ref 70–99)
Glucose-Capillary: 142 mg/dL — ABNORMAL HIGH (ref 70–99)
Glucose-Capillary: 143 mg/dL — ABNORMAL HIGH (ref 70–99)
Glucose-Capillary: 147 mg/dL — ABNORMAL HIGH (ref 70–99)
Glucose-Capillary: 149 mg/dL — ABNORMAL HIGH (ref 70–99)
Glucose-Capillary: 150 mg/dL — ABNORMAL HIGH (ref 70–99)
Glucose-Capillary: 167 mg/dL — ABNORMAL HIGH (ref 70–99)

## 2024-05-22 LAB — LACTIC ACID, PLASMA: Lactic Acid, Venous: 1.2 mmol/L (ref 0.5–1.9)

## 2024-05-22 MED ORDER — POTASSIUM CHLORIDE CRYS ER 20 MEQ PO TBCR: 80.0000 meq | EXTENDED_RELEASE_TABLET | Freq: Once | ORAL | Status: DC

## 2024-05-22 MED ORDER — FUROSEMIDE 10 MG/ML IJ SOLN
40.0000 mg | Freq: Once | INTRAMUSCULAR | Status: AC
  Administered 2024-05-22: 40 mg via INTRAVENOUS
  Filled 2024-05-22: qty 4

## 2024-05-22 MED ORDER — DEXTROSE 50 % IV SOLN
1.0000 | Freq: Once | INTRAVENOUS | Status: AC
  Administered 2024-05-22: 50 mL via INTRAVENOUS
  Filled 2024-05-22: qty 50

## 2024-05-22 MED ORDER — INSULIN GLARGINE-YFGN 100 UNIT/ML ~~LOC~~ SOLN
10.0000 [IU] | Freq: Once | SUBCUTANEOUS | Status: AC
  Administered 2024-05-22: 10 [IU] via SUBCUTANEOUS
  Filled 2024-05-22 (×2): qty 0.1

## 2024-05-22 MED ORDER — SODIUM ZIRCONIUM CYCLOSILICATE 10 G PO PACK
10.0000 g | PACK | Freq: Once | ORAL | Status: AC
  Administered 2024-05-22: 10 g via ORAL
  Filled 2024-05-22: qty 1

## 2024-05-22 MED ORDER — OXYCODONE HCL 5 MG PO TABS
5.0000 mg | ORAL_TABLET | ORAL | Status: DC | PRN
  Administered 2024-05-22 – 2024-05-27 (×8): 5 mg via ORAL
  Filled 2024-05-22 (×8): qty 1

## 2024-05-22 MED ORDER — INSULIN GLARGINE-YFGN 100 UNIT/ML ~~LOC~~ SOLN
5.0000 [IU] | Freq: Once | SUBCUTANEOUS | Status: DC
  Filled 2024-05-22: qty 0.05

## 2024-05-22 MED ORDER — SODIUM BICARBONATE 8.4 % IV SOLN
50.0000 meq | Freq: Once | INTRAVENOUS | Status: AC
  Administered 2024-05-22: 50 meq via INTRAVENOUS
  Filled 2024-05-22: qty 50

## 2024-05-22 MED ORDER — INSULIN GLARGINE-YFGN 100 UNIT/ML ~~LOC~~ SOLN
10.0000 [IU] | Freq: Every day | SUBCUTANEOUS | Status: DC
  Administered 2024-05-23 – 2024-05-25 (×3): 10 [IU] via SUBCUTANEOUS
  Filled 2024-05-22 (×4): qty 0.1

## 2024-05-22 MED ORDER — INSULIN ASPART 100 UNIT/ML IV SOLN
10.0000 [IU] | Freq: Once | INTRAVENOUS | Status: AC
  Administered 2024-05-22: 10 [IU] via INTRAVENOUS

## 2024-05-22 MED ORDER — GABAPENTIN 300 MG PO CAPS
300.0000 mg | ORAL_CAPSULE | Freq: Two times a day (BID) | ORAL | Status: DC
  Administered 2024-05-22 – 2024-05-28 (×13): 300 mg via ORAL
  Filled 2024-05-22 (×13): qty 1

## 2024-05-22 MED ORDER — FUROSEMIDE 10 MG/ML IJ SOLN
40.0000 mg | Freq: Once | INTRAMUSCULAR | Status: AC
Start: 2024-05-22 — End: 2024-05-22
  Administered 2024-05-22: 40 mg via INTRAVENOUS
  Filled 2024-05-22: qty 4

## 2024-05-22 MED ORDER — METOPROLOL TARTRATE 25 MG PO TABS
25.0000 mg | ORAL_TABLET | Freq: Two times a day (BID) | ORAL | Status: DC
  Administered 2024-05-22 – 2024-05-26 (×9): 25 mg via ORAL
  Filled 2024-05-22 (×9): qty 1

## 2024-05-22 MED ORDER — INSULIN GLARGINE-YFGN 100 UNIT/ML ~~LOC~~ SOLN: 5.0000 [IU] | Freq: Every day | SUBCUTANEOUS | Status: DC

## 2024-05-22 MED ORDER — INSULIN ASPART 100 UNIT/ML IJ SOLN
0.0000 [IU] | INTRAMUSCULAR | Status: DC
  Administered 2024-05-22: 2 [IU] via SUBCUTANEOUS
  Administered 2024-05-23: 8 [IU] via SUBCUTANEOUS
  Administered 2024-05-23: 2 [IU] via SUBCUTANEOUS

## 2024-05-22 MED ORDER — POTASSIUM CHLORIDE 20 MEQ PO PACK
80.0000 meq | PACK | Freq: Once | ORAL | Status: AC
  Administered 2024-05-22: 80 meq via ORAL
  Filled 2024-05-22: qty 4

## 2024-05-22 MED ORDER — TRAMADOL HCL 50 MG PO TABS
50.0000 mg | ORAL_TABLET | ORAL | Status: DC | PRN
  Administered 2024-05-22 – 2024-05-25 (×6): 50 mg via ORAL
  Filled 2024-05-22 (×7): qty 1

## 2024-05-22 MED ORDER — SODIUM BICARBONATE 8.4 % IV SOLN
100.0000 meq | Freq: Once | INTRAVENOUS | Status: AC
  Administered 2024-05-22: 100 meq via INTRAVENOUS
  Filled 2024-05-22: qty 100

## 2024-05-22 NOTE — Anesthesia Postprocedure Evaluation (Signed)
 Anesthesia Post Note  Patient: Natalie Anderson  Procedure(s) Performed: OFF PUMP CORONARY ARTERY BYPASS GRAFTING X 2, USING LEFT INTERNAL MAMMARY ARTERY AND ENDOSCOPIC HARVESTED LEFT GREATER SAPHENOUS VEIN (Chest)     Patient location during evaluation: SICU Anesthesia Type: General Level of consciousness: sedated Pain management: pain level controlled Vital Signs Assessment: post-procedure vital signs reviewed and stable Respiratory status: patient remains intubated per anesthesia plan Cardiovascular status: stable Postop Assessment: no apparent nausea or vomiting Anesthetic complications: no   No notable events documented.                  Bethenny Losee

## 2024-05-22 NOTE — Consult Note (Signed)
 NAME:  Natalie Anderson, MRN:  [id number], DOB:  15-Oct-1945, LOS: 5 ADMISSION DATE:  05/17/2024, CONSULTATION DATE:  10/28 REFERRING MD:  Dr. Shyrl, CHIEF COMPLAINT:  CABG x 2  History of Present Illness:  Patient is a 78 yo F presents to [facility] w/ DMT2, HTN, HLD presents to [facility] on 10/24.  Patient seen by cardiology for ongoing sob and exertional CP over the past 4-5 weeks. Echo lvef 70% w/ grade 1 diastolic dysfunction. Nuc test shwoing large partially reversible anterior defect. On 10/24 take for cath showing proximal stenosis to both RCA and LAD. Admitted post cath. Started on heparin , asa, statin. TCTS consulted and recommended CABG. On 10/28 patient taken to OR CABGx2. Post op intubated/sedate. Pccm consulted.  Pertinent  Medical History   Past Medical History:  Diagnosis Date   Arthritis    Asthma    as a child   Blood transfusion 07/25/1977   Cataract immature    bilateral   Chronic back pain    spondylosis and stenosis   Complication of anesthesia    stopped breathing after anesthesia   COPD (chronic obstructive pulmonary disease) (HCC)    Diabetes mellitus    takes Metformin  bid and Levimir bid and Victoza daily   Dizziness    occasionally   Dry skin    and itchy    Fibroid tumor    GERD (gastroesophageal reflux disease)    takes Protonix  daily   History of shingles 15-83yrs ago   Hyperlipidemia    takes Lipitor occasionally   Hypertension    takes Losartan daily   Impaired hearing    Joint pain    Joint swelling    Migraine    last one 3 wks ago   Nocturia    Osteoporosis    Osteoporosis    Pneumonia    hx of-double as a child   PONV (postoperative nausea and vomiting)    Seasonal allergies    takes Claritin  daily   Shortness of breath    with exertion   Urinary frequency    UTI (lower urinary tract infection)    hx of   Vertigo    hx of   Vitamin D deficiency    takes Vit D 50,000units on Sat     Significant Hospital Events: Including  procedures, antibiotic start and stop dates in addition to other pertinent events   10/24 cath showing severe 2 vessel disease; admitted by cards 10/28 cabg x2 10/29 patient extubated yesterday afternoon, post extubation ABG 7.337, pCO2 34.9, pO2 56, bicarb 18.7-hypoxemia with metabolic acidosis, patient was placed on trial of high flow nasal cannula and then transition to BiPAP due to no significant improvement in hypoxemia on ABG.  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 rate 18, height 5' 2.5 (1.588 m), weight 70.3 kg, SpO2 96%. CVP:  [8 mmHg-26 mmHg] 26 mmHg CO:  [2.7 L/min-6 L/min] 4.2 L/min CI:  [1.6 L/min/m2-3.6 L/min/m2] 2.5 L/min/m2  Vent Mode: PCV;BIPAP FiO2 (%):  [40 %-100 %] 70 % Set Rate:  [4 bmp-16 bmp] 14 bmp Vt Set:  [  410 mL] 410 mL PEEP:  [5 cmH20-8 cmH20] 8 cmH20 Pressure Support:  [10 cmH20] 10 cmH20 Plateau Pressure:  [14 cmH20] 14 cmH20   Intake/Output Summary (Last 24 hours) at 05/22/2024 0801 Last data filed at 05/22/2024 0700 Gross per 24 hour  Intake 3613.26 ml  Output 1425 ml  Net 2188.26 ml   Filed Weights   05/17/24 1607 05/21/24 0629 05/22/24 0500  Weight: 64.4 kg 64.4 kg 70.3 kg    Examination:  General: Pleasant acute on chronic older adult female, lying in ICU bed on BiPAP in NAD HEENT: Normocephalic, PERRL intact, pink MM, missing teeth, wearing BiPAP Pulmonary: Clear but diminished in lower bases, on BiPAP FiO2 70% CV: S1, S2, RRR--normal sinus rhythm, epicardial wires in place-backup rate 50, mediastinal and pleural chest tubes  intact-minimal serosanguineous output GI: Soft, active bowel sounds Extremities: Moves all extremities to command, A-line in left wrist, generalized edema GU: Foley in place Skin: No rashes/lesions Neuro: Alert following all commands while on BiPAP  Resolved Hospital Problem list     Assessment & Plan:  Postoperative vent management COPD?: patient states she has hx on prn albuterol; never smoker but exposed through second hand smoke Hypoxemia Metabolic acidosis 10/28 patient extubated successfully, initially just requiring nasal cannula but post extubation ABG showing hypoxemia with metabolic acidosis-patient placed on BiPAP after trial of high flow nasal cannula 10/29 ABG pH 7.305, pCO2 send 37.4, pO2 63, bicarb 18.6-patient on 70% FiO2 Patient maintaining O2 sats between 94 to 95%, patient not any distress Chest x-ray showing low lung volumes, atelectasis, trace pleural effusions P: Discussed with CVTS, plan to diurese patient 40 mg of IV Lasix Continue to wean FiO2 down as tolerated will repeat ABG around noon today Encourage deep breathing exercises, dangle at side of bed Will assess the need for additional diuresis later today Triple therapy BD  S/p 2-vessel CABG CAD HTN HLD P: Continue postop management per TCTs Monitor chest tube output Continue to keep epicardial wires and as well as chest tubes in place Off pressors but requiring Cleviprex for hypertension Continue to wean as tolerated Diurese as above Start metoprolol Continue ASA/statin  DMT2 -A1c 6.1 on 10/24 P: Continue to wean insulin  drip per protocol Will need sliding scale insulin  along with CBG checks after infusion is stopped  AKI Hyponatremia Creatinine 1.29 from 1.03 P: Continue to trend renal function daily  Continue to monitor and optimize electrolytes daily Continue to monitor urine output Continue strict I/Os Continue Adequate renal perfusion  Avoid nephrotoxic agents  Continue to  monitor sodium Patient is 4 L positive since admission, monitor urine output with starting diuresis  Expected postop anemia Hemoglobin stable 9.4 P: Continue to trend CBC daily Monitor chest tube output Monitor for signs of bleeding Transfuse for hemoglobin less than 7  Peripheral neuropathy P: Continue current pain regimen- Consider restarting gabapentin  in the next 24 hours   Labs   CBC: Recent Labs  Lab 05/20/24 0353 05/21/24 0401 05/21/24 0822 05/21/24 1205 05/21/24 1214 05/21/24 1700 05/21/24 1800 05/21/24 1850 05/22/24 0525 05/22/24 0552  WBC 6.6 7.2  --  11.4*  --   --  15.3*  --  17.6*  --   HGB 10.4* 10.8*   < > 8.6*   < > 7.8* 9.0* 8.5* 9.4* 9.2*  HCT 31.7* 32.7*   < > 26.8*   < > 23.0* 27.1* 25.0* 28.6* 27.0*  MCV 79.4* 80.5  --  81.2  --   --  80.9  --  81.5  --   PLT 371 349  --  271  --   --  331  --  374  --    < > = values in this interval not displayed.    Basic Metabolic Panel: Recent Labs  Lab 05/20/24 1025 05/21/24 0401 05/21/24 0822 05/21/24 0955 05/21/24 1109 05/21/24 1112 05/21/24 1214 05/21/24 1700 05/21/24 1800 05/21/24 1850 05/22/24 0525 05/22/24 0552  NA 139 137 136 134*   < > 136   < > 136 129* 133* 130* 132*  K 4.5 3.9 4.0 4.0   < > 4.0   < > 4.1 4.3 4.2 4.1 4.1  CL 101 102 100 100  --  101  --   --  100  --  100  --   CO2 27 23  --   --   --   --   --   --  19*  --  20*  --   GLUCOSE 83 102* 193* 159*  --  130*  --   --  170*  --  145*  --   BUN 12 13 12 12   --  11  --   --  13  --  16  --   CREATININE 1.09* 1.08* 1.10* 0.90  --  0.90  --   --  1.03*  --  1.29*  --   CALCIUM  9.7 9.1  --   --   --   --   --   --  8.1*  --  8.3*  --   MG  --   --   --   --   --   --   --   --  3.0*  --  2.4  --    < > = values in this interval not displayed.   GFR: Estimated Creatinine Clearance: 33.4 mL/min (A) (by C-G formula based on SCr of 1.29 mg/dL (H)). Recent Labs  Lab 05/21/24 0401 05/21/24 1205 05/21/24 1800 05/22/24 0525   WBC 7.2 11.4* 15.3* 17.6*    Liver Function Tests: No results for input(s): AST, ALT, ALKPHOS, BILITOT, PROT, ALBUMIN in the last 168 hours. No results for input(s): LIPASE, AMYLASE in the last 168 hours. No results for input(s): AMMONIA in the last 168 hours.  ABG    Component Value Date/Time   PHART 7.305 (L) 05/22/2024 0552   PCO2ART 37.4 05/22/2024 0552   PO2ART 63 (L) 05/22/2024 0552   HCO3 18.6 (L) 05/22/2024 0552   TCO2 20 (L) 05/22/2024 0552   ACIDBASEDEF 7.0 (H) 05/22/2024 0552   O2SAT 89 05/22/2024 0552     Coagulation Profile: Recent Labs  Lab 05/20/24 0353 05/21/24 1205  INR 1.0 1.7*    Cardiac Enzymes: No results for input(s): CKTOTAL, CKMB, CKMBINDEX, TROPONINI in the last 168 hours.  HbA1C: Hgb A1c MFr Bld  Date/Time Value Ref Range Status  05/17/2024 05:08 PM 6.1 (H) 4.8 - 5.6 % Final    Comment:    (NOTE) Diagnosis of Diabetes The following HbA1c ranges recommended by the American Diabetes Association (ADA) may be used as an aid in the diagnosis of diabetes mellitus.  Hemoglobin             Suggested A1C NGSP%              Diagnosis  <5.7                   Non Diabetic  5.7-6.4  Pre-Diabetic  >6.4                   Diabetic  <7.0                   Glycemic control for                       adults with diabetes.      CBG: Recent Labs  Lab 05/21/24 2325 05/22/24 0022 05/22/24 0207 05/22/24 0309 05/22/24 0536  GLUCAP 177* 147* 129* 149* 143*    Review of Systems:   Patient is intubated; therefore, history has been obtained from chart review.    Past Medical History:  She,  has a past medical history of Arthritis, Asthma, Blood transfusion (07/25/1977), Cataract immature, Chronic back pain, Complication of anesthesia, COPD (chronic obstructive pulmonary disease) (HCC), Diabetes mellitus, Dizziness, Dry skin, Fibroid tumor, GERD (gastroesophageal reflux disease), History of shingles  (15-21yrs ago), Hyperlipidemia, Hypertension, Impaired hearing, Joint pain, Joint swelling, Migraine, Nocturia, Osteoporosis, Osteoporosis, Pneumonia, PONV (postoperative nausea and vomiting), Seasonal allergies, Shortness of breath, Urinary frequency, UTI (lower urinary tract infection), Vertigo, and Vitamin D deficiency.   Surgical History:   Past Surgical History:  Procedure Laterality Date   BACK SURGERY  12/24/2010   x2   CATARACT EXTRACTION W/PHACO Right 04/05/2021   Procedure: CATARACT EXTRACTION PHACO AND INTRAOCULAR LENS PLACEMENT (IOC);  Surgeon: Harrie Agent, MD;  Location: [facility];  Service: Ophthalmology;  Laterality: Right;  CDE 5.98   CATARACT EXTRACTION W/PHACO Left 04/19/2021   Procedure: CATARACT EXTRACTION PHACO AND INTRAOCULAR LENS PLACEMENT LEFT EYE;  Surgeon: Harrie Agent, MD;  Location: [facility];  Service: Ophthalmology;  Laterality: Left;  left CDE=5.90   COLONOSCOPY WITH PROPOFOL  N/A 12/09/2021   Surgeon: Shaaron Lamar HERO, MD; Six 3 to 9 mm polyps removed and 1 hemostasis clip placed.  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  Recommended repeat in 3 years.   DILATION AND CURETTAGE OF UTERUS  07/25/1977   ESOPHAGOGASTRODUODENOSCOPY (EGD) WITH PROPOFOL  N/A 12/09/2021   Surgeon: Shaaron Lamar HERO, MD; Moderate Schatzki ring. Dilated. Small hiatal hernia.   HEMOSTASIS CLIP PLACEMENT  12/09/2021   Procedure: HEMOSTASIS CLIP PLACEMENT;  Surgeon: Shaaron Lamar HERO, MD;  Location: [facility];  Service: Endoscopy;;   LEFT HEART CATH AND CORONARY ANGIOGRAPHY N/A 05/17/2024   Procedure: LEFT HEART CATH AND CORONARY ANGIOGRAPHY;  Surgeon: Anner Alm ORN, MD;  Location: [facility] INVASIVE CV LAB;  Service: Cardiovascular;  Laterality: N/A;   MALONEY DILATION N/A 12/09/2021   Procedure: AGAPITO DILATION;  Surgeon: Shaaron Lamar HERO, MD;  Location: [facility];  Service: Endoscopy;  Laterality: N/A;   POLYPECTOMY  12/09/2021   Procedure: POLYPECTOMY;   Surgeon: Shaaron Lamar HERO, MD;  Location: [facility];  Service: Endoscopy;;     Social History:   reports that she has never smoked. She has never used smokeless tobacco. She reports that she does not drink alcohol  and does not use drugs.   Family History:  Her family history includes Breast cancer in her mother; Colon cancer in her paternal grandmother; Colon cancer (age of onset: 60) in her cousin and cousin; Colon cancer (age of onset: 46) in her cousin; Colon cancer (age of onset: 50) in her nephew; Colon cancer (age of onset: 11) in her niece; Colon polyps in her sister; Lung cancer in her brother and mother. There is no history of Anesthesia problems, Hypotension, Malignant hyperthermia,  or Pseudochol deficiency.   Allergies Allergies  Allergen Reactions   Shellfish Allergy Shortness Of Breath    Shrimp allergy   Latex Rash    Blisters    Morphine  And Codeine Itching and Nausea And Vomiting   Other Hives and Rash    Strawberries and oranges    Amoxicillin Other (See Comments)    Caused mouth sores, and Gastric issues    Prednisone     Makes me bleed out of my mouth   Nickel Rash   Penicillins Hives     Home Medications  Prior to Admission medications   Medication Sig Start Date End Date Taking? Authorizing Provider  albuterol (PROVENTIL HFA;VENTOLIN HFA) 108 (90 BASE) MCG/ACT inhaler Inhale 2 puffs into the lungs every 6 (six) hours as needed for wheezing or shortness of breath.   Yes [provider]  amLODipine (NORVASC) 5 MG tablet Take 5 mg by mouth at bedtime.   Yes [provider]  aspirin EC 81 MG tablet Take 81 mg by mouth at bedtime. Swallow whole.   Yes [provider]  Cholecalciferol (VITAMIN D-3) 125 MCG (5000 UT) TABS Take 5,000 Units by mouth at bedtime.   Yes [provider]  fluticasone  (FLONASE) 50 MCG/ACT nasal spray Place 1 spray into both nostrils in the morning and at bedtime. 01/03/24  Yes [provider]   gabapentin  (NEURONTIN ) 300 MG capsule Take 600 mg by mouth 3 (three) times daily. Patient taking differently: Take 600 mg by mouth 2 (two) times daily.   Yes [provider]  loratadine  (CLARITIN ) 10 MG tablet Take 10 mg by mouth daily.   Yes [provider]  losartan (COZAAR) 100 MG tablet Take 100 mg by mouth in the morning.   Yes [provider]  metFORMIN  (GLUCOPHAGE ) 500 MG tablet Take 1,000 mg by mouth 2 (two) times daily with a meal.   Yes [provider]  MOUNJARO 2.5 MG/0.5ML Pen Inject 2.5 mg into the skin once a week. Fridays 12/06/23  Yes [provider]  pantoprazole  (PROTONIX ) 20 MG tablet Take 1 tablet (20 mg total) by mouth daily. 01/05/24  Yes Rourk, Lamar HERO, MD  rosuvastatin (CRESTOR) 5 MG tablet Take 5 mg by mouth once a week. Wednesdays 04/02/24  Yes [provider]  TRESIBA FLEXTOUCH 200 UNIT/ML FlexTouch Pen Inject 40 Units into the skin in the morning. 03/11/21  Yes [provider]     Critical care time: 45 minutes     Christian Yolani Vo AGACNP-BC   [facility] 05/22/2024, 9:52 AM  Please see [email or web address] for pager details.  From 7A-7P if no response, please call [phone]. After hours, please call ELink [phone].

## 2024-05-22 NOTE — Progress Notes (Addendum)
 TCTS DAILY ICU PROGRESS NOTE                   [address]            [place] [address]          [phone]   1 Day Post-Op Procedure(s) (LRB): OFF PUMP CORONARY ARTERY BYPASS GRAFTING X 2, USING LEFT INTERNAL MAMMARY ARTERY AND ENDOSCOPIC HARVESTED LEFT GREATER SAPHENOUS VEIN (N/A)  Total Length of Stay:  LOS: 5 days   Subjective: Pain controlled  Objective: Vital signs in last 24 hours: Temp:  [94.8 F (34.9 C)-98.9 F (37.2 C)] 98.4 F (36.9 C) (10/29 0323) Pulse Rate:  [67-107] 81 (10/29 0700) Cardiac Rhythm: Normal sinus rhythm (10/28 2000) Resp:  [8-25] 22 (10/29 0700) BP: (83-153)/(50-127) 137/61 (10/29 0645) SpO2:  [82 %-100 %] 96 % (10/29 0700) Arterial Line BP: (100-194)/(35-82) 128/52 (10/29 0700) FiO2 (%):  [40 %-100 %] 70 % (10/29 0355) Weight:  [70.3 kg] 70.3 kg (10/29 0500)  Filed Weights   05/17/24 1607 05/21/24 0629 05/22/24 0500  Weight: 64.4 kg 64.4 kg 70.3 kg    Weight change: 5.9 kg   Hemodynamic parameters for last 24 hours: CVP:  [8 mmHg-21 mmHg] 18 mmHg CO:  [2.7 L/min-6 L/min] 4.2 L/min CI:  [1.6 L/min/m2-3.6 L/min/m2] 2.5 L/min/m2  Intake/Output from previous day: 10/28 0701 - 10/29 0700 In: 3613.3 [I.V.:2461.3; IV Piggyback:1152] Out: 1425 [Urine:1100; Blood:100; Chest Tube:225]  Intake/Output this shift: No intake/output data recorded.  Current Meds: Scheduled Meds:  acetaminophen   1,000 mg Oral Q6H   Or   acetaminophen  (TYLENOL ) oral liquid 160 mg/5 mL  1,000 mg Per Tube Q6H   acetaminophen  (TYLENOL ) oral liquid 160 mg/5 mL  650 mg Per Tube Once   arformoterol  15 mcg Nebulization BID   aspirin EC  325 mg Oral Daily   Or   aspirin  324 mg Per Tube Daily   bisacodyl  10 mg Oral Daily   Or   bisacodyl  10 mg Rectal Daily   Chlorhexidine Gluconate Cloth  6 each Topical Daily   docusate sodium  200 mg Oral Daily   metoCLOPramide (REGLAN) injection  10 mg Intravenous Q6H   metoprolol tartrate  12.5 mg Oral  BID   Or   metoprolol tartrate  12.5 mg Per Tube BID   mupirocin ointment  1 Application Nasal BID   [START ON 05/23/2024] pantoprazole   40 mg Oral Daily   pantoprazole  (PROTONIX ) IV  40 mg Intravenous QHS   revefenacin  175 mcg Nebulization Daily   [START ON 05/23/2024] rosuvastatin  5 mg Oral Weekly   sodium chloride  flush  3 mL Intravenous Q12H   Continuous Infusions:  sodium chloride      sodium chloride      sodium chloride      albumin human 20 mL/hr at 05/22/24 0200    ceFAZolin  (ANCEF ) IV 2 g (05/22/24 0553)   clevidipine 8 mg/hr (05/22/24 0549)   dexmedetomidine (PRECEDEX) IV infusion Stopped (05/21/24 1608)   insulin  2.6 Units/hr (05/22/24 0500)   lactated ringers      lactated ringers  20 mL/hr at 05/21/24 1218   nitroGLYCERIN Stopped (05/21/24 1742)   norepinephrine (LEVOPHED) Adult infusion Stopped (05/21/24 1538)   PRN Meds:.sodium chloride , albumin human, dextrose , fentaNYL  (SUBLIMAZE ) injection, ipratropium-albuterol, metoprolol tartrate, midazolam  PF, ondansetron  (ZOFRAN ) IV, mouth rinse, oxyCODONE , sodium chloride  flush, traMADol  General appearance: alert, cooperative, and no distress Heart: RRR Lungs: clear anteriorly Abdomen: soft, non tender Extremities:  no edema Wound: dressings CDI  Lab Results: CBC: Recent Labs    05/21/24 1800 05/21/24 1850 05/22/24 0525 05/22/24 0552  WBC 15.3*  --  17.6*  --   HGB 9.0*   < > 9.4* 9.2*  HCT 27.1*   < > 28.6* 27.0*  PLT 331  --  374  --    < > = values in this interval not displayed.   BMET:  Recent Labs    05/21/24 0401 05/21/24 0822 05/21/24 1112 05/21/24 1214 05/21/24 1800 05/21/24 1850 05/22/24 0552  NA 137   < > 136   < > 129* 133* 132*  K 3.9   < > 4.0   < > 4.3 4.2 4.1  CL 102   < > 101  --  100  --   --   CO2 23  --   --   --  19*  --   --   GLUCOSE 102*   < > 130*  --  170*  --   --   BUN 13   < > 11  --  13  --   --   CREATININE 1.08*   < > 0.90  --  1.03*  --   --   CALCIUM  9.1  --   --    --  8.1*  --   --    < > = values in this interval not displayed.    CMET: Lab Results  Component Value Date   WBC 17.6 (H) 05/22/2024   HGB 9.2 (L) 05/22/2024   HCT 27.0 (L) 05/22/2024   PLT 374 05/22/2024   GLUCOSE 170 (H) 05/21/2024   CHOL 217 (H) 05/21/2024   TRIG 169 (H) 05/21/2024   HDL 44 05/21/2024   LDLCALC 139 (H) 05/21/2024   NA 132 (L) 05/22/2024   K 4.1 05/22/2024   CL 100 05/21/2024   CREATININE 1.03 (H) 05/21/2024   BUN 13 05/21/2024   CO2 19 (L) 05/21/2024   INR 1.7 (H) 05/21/2024   HGBA1C 6.1 (H) 05/17/2024      PT/INR:  Recent Labs    05/21/24 1205  LABPROT 20.9*  INR 1.7*   Radiology: [facility] Chest Port 1 View Result Date: 05/21/2024 EXAM: 1 VIEW(S) XRAY OF THE CHEST 05/21/2024 12:29:00 PM COMPARISON: Comparison 05/19/2024. CLINICAL HISTORY: [id number] S/P CABG x 2 [id number]. S/P CABG FINDINGS: LINES, TUBES AND DEVICES: Endotracheal and nasogastric tubes are in good position. Right internal jugular catheter is noted with tip in SVC. LUNGS AND PLEURA: Minimal left basilar subsegmental atelectasis or scarring is noted. No pulmonary edema. No pleural effusion. No pneumothorax. HEART AND MEDIASTINUM: No acute abnormality of the cardiac and mediastinal silhouettes. BONES AND SOFT TISSUES: Old left rib fractures are again noted. IMPRESSION: 1. Endotracheal and nasogastric tubes in appropriate position. 2. Right internal jugular catheter tip in the superior vena cava. 3. Minimal left basilar subsegmental atelectasis or scarring. 4. Old left rib fractures. Electronically signed by: Lynwood Seip MD 05/21/2024 12:59 PM EDT RP Workstation: [id number]     Assessment/Plan: S/P Procedure(s) (LRB): OFF PUMP CORONARY ARTERY BYPASS GRAFTING X 2, USING LEFT INTERNAL MAMMARY ARTERY AND ENDOSCOPIC HARVESTED LEFT GREATER SAPHENOUS VEIN (N/A) POD#1  1 afeb, hemodyn stable, mildly elev SVRI, CVP 26-? If accurate current gtts - cleviprex- wean as able, NSR, EKG - old septal infarct  findings 2 O2 sats ok on bipap/70%, ABG shows some met acidosis/hypoxemia- PCCM assisting w/ management 3 good UOP, weight up 6 kg if accurate-  will lbegin diuresis 4 CT 225 ml- likely remove today 5 CXR- bibasilar atx 6 BS- adeq control, routine protocols 7 minor hyponatremia- monitor  8 AKI- creat 1.29/ BUN 16 9 expected ABLA- monitor, not at transfusion threshold 10 pulm hygiene and rehab as able    Natalie FORBES Cera PA-C Pager [phone] 05/22/2024 7:11 AM  Agree Will diurese IS, pulm hygiene Will remove wires Continue ICU care  Natalie Anderson

## 2024-05-22 NOTE — Telephone Encounter (Signed)
 PHARMACIST LIPID MONITORING  Natalie Anderson is a 78 y.o. y.o. adult admitted on 05/17/24 with unstable angina/LHC eval. Ultimately underwent CABG x2. Pharmacy has been consulted to optimize lipid-lowering therapy with the indication of secondary prevention for clinical ASCVD.   Recent Labs: Lipid Panel:     Component Value Date/Time   CHOL 217 (H) 05/21/2024 0401   TRIG 169 (H) 05/21/2024 0401   HDL 44 05/21/2024 0401   CHOLHDL 4.9 05/21/2024 0401   VLDL 34 05/21/2024 0401   LDLCALC 139 (H) 05/21/2024 0401    Hepatic function panel: No results found for: AST, ALT, ALKPHOS, BILITOT, BILIDIR, IBILI  Serum Creatinine  Creatinine, Ser (mg/dL)  Date Value  89/70/7974 1.29 (H)   Estimated CrCl of Estimated Creatinine Clearance: 33.4 mL/min (A) (by C-G formula based on SCr of 1.29 mg/dL (H)).  Current therapy and lipid therapy tolerance: Current lipid-lowering therapy: rosuvastatin 5 mg weekly  Previous lipid-lowering therapies (if applicable): atorvastatin , pravastatin  Documented or reported allergies or intolerances to lipid-lowering therapies (if applicable): myalgias in both legs with atorvastatin     Assessment:    Patient is  Statin-intolerant Baseline LDL: 139 Major ASCVD events: Recent ACS in past 12 months  High-Risk Conditions: Age >/= 65 years, Diabetes mellitus, and Hypertension  Plan:    Statin intensity: Statin intolerance noted. Patient likely will not tolerate daily therapy due to previous statin trials. Ezetimibe addition will not get her to goal LDL <70. Consider adding PCSK9 inhibitor outpatient.  Refer to lipid clinic:   Yes

## 2024-05-22 NOTE — Procedures (Signed)
 Arterial Catheter Insertion Procedure Note  JEANI FASSNACHT  [id number]  [birth year]  Date:05/22/24  Time:1:23 AM    Provider Performing: Deward LELON Eastern    Procedure: Insertion of Arterial Line (63379) with US  guidance (23062)   Indication(s) Blood pressure monitoring and/or need for frequent ABGs  Consent Risks of the procedure as well as the alternatives and risks of each were explained to the patient and/or caregiver.  Consent for the procedure was obtained and is signed in the bedside chart  Anesthesia None   Time Out Verified patient identification, verified procedure, site/side was marked, verified correct patient position, special equipment/implants available, medications/allergies/relevant history reviewed, required imaging and test results available.   Sterile Technique Maximal sterile technique including full sterile barrier drape, hand hygiene, sterile gown, sterile gloves, mask, hair covering, sterile ultrasound probe cover (if used).   Procedure Description Area of catheter insertion was cleaned with chlorhexidine and draped in sterile fashion. With real-time ultrasound guidance an arterial catheter was placed into the left radial artery.  Appropriate arterial tracings confirmed on monitor.     Complications/Tolerance None; patient tolerated the procedure well.   EBL Minimal   Specimen(s) None    Deward Eastern, AGACNP-BC [facility]  See Amion for personal pager PCCM on call pager [phone] until 7pm. Please call Elink 7p-7a. [phone]  05/22/2024 1:23 AM

## 2024-05-23 DIAGNOSIS — E875 Hyperkalemia: Secondary | ICD-10-CM | POA: Diagnosis not present

## 2024-05-23 DIAGNOSIS — E8721 Acute metabolic acidosis: Secondary | ICD-10-CM | POA: Diagnosis not present

## 2024-05-23 DIAGNOSIS — J9600 Acute respiratory failure, unspecified whether with hypoxia or hypercapnia: Secondary | ICD-10-CM | POA: Diagnosis not present

## 2024-05-23 DIAGNOSIS — I10 Essential (primary) hypertension: Secondary | ICD-10-CM | POA: Diagnosis not present

## 2024-05-23 LAB — CBC
HCT: 25.3 % — ABNORMAL LOW (ref 36.0–46.0)
Hemoglobin: 8.3 g/dL — ABNORMAL LOW (ref 12.0–15.0)
MCH: 26.5 pg (ref 26.0–34.0)
MCHC: 32.8 g/dL (ref 30.0–36.0)
MCV: 80.8 fL (ref 80.0–100.0)
Platelets: 270 K/uL (ref 150–400)
RBC: 3.13 MIL/uL — ABNORMAL LOW (ref 3.87–5.11)
RDW: 14 % (ref 11.5–15.5)
WBC: 13.4 K/uL — ABNORMAL HIGH (ref 4.0–10.5)
nRBC: 0 % (ref 0.0–0.2)

## 2024-05-23 LAB — BASIC METABOLIC PANEL WITH GFR
Anion gap: 12 (ref 5–15)
Anion gap: 13 (ref 5–15)
BUN: 24 mg/dL — ABNORMAL HIGH (ref 8–23)
BUN: 27 mg/dL — ABNORMAL HIGH (ref 8–23)
CO2: 23 mmol/L (ref 22–32)
CO2: 24 mmol/L (ref 22–32)
Calcium: 7.6 mg/dL — ABNORMAL LOW (ref 8.9–10.3)
Calcium: 8.2 mg/dL — ABNORMAL LOW (ref 8.9–10.3)
Chloride: 98 mmol/L (ref 98–111)
Chloride: 92 mmol/L — ABNORMAL LOW (ref 98–111)
Creatinine, Ser: 1.64 mg/dL — ABNORMAL HIGH (ref 0.44–1.00)
Creatinine, Ser: 1.54 mg/dL — ABNORMAL HIGH (ref 0.44–1.00)
GFR, Estimated: 32 mL/min — ABNORMAL LOW (ref >60)
GFR, Estimated: 34 mL/min — ABNORMAL LOW (ref >60)
Glucose, Bld: 130 mg/dL — ABNORMAL HIGH (ref 70–99)
Glucose, Bld: 86 mg/dL (ref 70–99)
Potassium: 4.3 mmol/L (ref 3.5–5.1)
Potassium: 3.6 mmol/L (ref 3.5–5.1)
Sodium: 133 mmol/L — ABNORMAL LOW (ref 135–145)
Sodium: 129 mmol/L — ABNORMAL LOW (ref 135–145)

## 2024-05-23 LAB — GLUCOSE, CAPILLARY
Glucose-Capillary: 136 mg/dL — ABNORMAL HIGH (ref 70–99)
Glucose-Capillary: 218 mg/dL — ABNORMAL HIGH (ref 70–99)
Glucose-Capillary: 152 mg/dL — ABNORMAL HIGH (ref 70–99)
Glucose-Capillary: 89 mg/dL (ref 70–99)
Glucose-Capillary: 142 mg/dL — ABNORMAL HIGH (ref 70–99)

## 2024-05-23 MED ORDER — INSULIN ASPART 100 UNIT/ML IJ SOLN
0.0000 [IU] | Freq: Three times a day (TID) | INTRAMUSCULAR | Status: DC
  Administered 2024-05-23 – 2024-05-25 (×3): 3 [IU] via SUBCUTANEOUS
  Administered 2024-05-25: 2 [IU] via SUBCUTANEOUS
  Administered 2024-05-25: 3 [IU] via SUBCUTANEOUS
  Administered 2024-05-26: 2 [IU] via SUBCUTANEOUS
  Administered 2024-05-26: 5 [IU] via SUBCUTANEOUS
  Administered 2024-05-26 – 2024-05-27 (×3): 2 [IU] via SUBCUTANEOUS
  Administered 2024-05-27: 3 [IU] via SUBCUTANEOUS
  Administered 2024-05-28: 5 [IU] via SUBCUTANEOUS
  Filled 2024-05-23: qty 2
  Filled 2024-05-23: qty 5
  Filled 2024-05-23: qty 2
  Filled 2024-05-23: qty 5

## 2024-05-23 MED ORDER — INSULIN ASPART 100 UNIT/ML IJ SOLN: 0.0000 [IU] | Freq: Every day | INTRAMUSCULAR | Status: DC

## 2024-05-23 MED ORDER — ENOXAPARIN SODIUM 30 MG/0.3ML IJ SOSY
30.0000 mg | PREFILLED_SYRINGE | Freq: Every day | INTRAMUSCULAR | Status: DC
  Administered 2024-05-23 – 2024-05-28 (×6): 30 mg via SUBCUTANEOUS
  Filled 2024-05-23 (×6): qty 0.3

## 2024-05-23 MED ORDER — POTASSIUM CHLORIDE CRYS ER 20 MEQ PO TBCR
20.0000 meq | EXTENDED_RELEASE_TABLET | ORAL | Status: AC
  Administered 2024-05-23 (×2): 20 meq via ORAL
  Filled 2024-05-23 (×2): qty 1

## 2024-05-23 MED ORDER — FUROSEMIDE 10 MG/ML IJ SOLN
60.0000 mg | Freq: Once | INTRAMUSCULAR | Status: AC
  Administered 2024-05-23: 60 mg via INTRAVENOUS
  Filled 2024-05-23: qty 6

## 2024-05-23 MED FILL — Heparin Sodium (Porcine) Inj 1000 Unit/ML: Qty: 1000 | Status: AC

## 2024-05-23 MED FILL — Potassium Chloride Inj 2 mEq/ML: INTRAVENOUS | Qty: 40 | Status: AC

## 2024-05-23 MED FILL — Lidocaine HCl Local Preservative Free (PF) Inj 2%: INTRAMUSCULAR | Qty: 14 | Status: AC

## 2024-05-23 NOTE — Progress Notes (Signed)
      [address]       [place] [address]             [phone]                 2 Days Post-Op Procedure(s) (LRB): OFF PUMP CORONARY ARTERY BYPASS GRAFTING X 2, USING LEFT INTERNAL MAMMARY ARTERY AND ENDOSCOPIC HARVESTED LEFT GREATER SAPHENOUS VEIN (N/A)   Events: No events _______________________________________________________________ Vitals: BP (!) 119/52 (BP Location: Right Arm)   Pulse 88   Temp (!) 97.5 F (36.4 C) (Axillary)   Resp (!) 9   Ht 5' 2.5 (1.588 m)   Wt 68.4 kg   SpO2 96%   BMI 27.14 kg/m  Filed Weights   05/21/24 0629 05/22/24 0500 05/23/24 0500  Weight: 64.4 kg 70.3 kg 68.4 kg     - Neuro: alert NAD  - Cardiovascular: sinus  Drips: none.   CVP:  [12 mmHg-39 mmHg] 21 mmHg CO:  [3.5 L/min-6.6 L/min] 5.4 L/min CI:  [2.1 L/min/m2-4 L/min/m2] 3.2 L/min/m2  - Pulm: EWOB on HHFNC  ABG    Component Value Date/Time   PHART 7.358 05/22/2024 1713   PCO2ART 35.9 05/22/2024 1713   PO2ART 90 05/22/2024 1713   HCO3 20.2 05/22/2024 1713   TCO2 21 (L) 05/22/2024 1713   ACIDBASEDEF 5.0 (H) 05/22/2024 1713   O2SAT 97 05/22/2024 1713    - Abd: ND - Extremity: trace edema  .Intake/Output      10/29 0701 10/30 0700 10/30 0701 10/31 0700   I.V. (mL/kg) 379.5 (5.5)    Other 10    IV Piggyback 302.2    Total Intake(mL/kg) 691.7 (10.1)    Urine (mL/kg/hr) 790 (0.5)    Blood     Chest Tube 130    Total Output 920    Net -228.3            _______________________________________________________________ Labs:    Latest Ref Rng & Units 05/23/2024    4:12 AM 05/22/2024    5:13 PM 05/22/2024    4:36 PM  CBC  WBC 4.0 - 10.5 K/uL 13.4   15.8   Hemoglobin 12.0 - 15.0 g/dL 8.3  8.5  8.9   Hematocrit 36.0 - 46.0 % 25.3  25.0  27.1   Platelets 150 - 400 K/uL 270   314       Latest Ref Rng & Units 05/23/2024    4:12 AM 05/22/2024    9:24 PM 05/22/2024    5:13 PM  CMP  Glucose 70 - 99 mg/dL 869  889    BUN 8 - 23 mg/dL 24   23    Creatinine 9.55 - 1.00 mg/dL 8.35  8.46    Sodium 864 - 145 mmol/L 133  134  131   Potassium 3.5 - 5.1 mmol/L 4.3  4.0  6.6   Chloride 98 - 111 mmol/L 98  99    CO2 22 - 32 mmol/L 23  23    Calcium  8.9 - 10.3 mg/dL 7.6  7.5      CXR: -  _______________________________________________________________  Assessment and Plan: POD 2 s/p CABG  Neuro: pain controlled CV: on A/S/BB.  Will remove wires Pulm: IS, ambulation Renal: creat up slightly.  Will continue diuesis GI: on diet Heme: stable ID: afebrile Endo: SSI Dispo: continue ICU care.   Linnie MALVA Rayas 05/23/2024 7:27 AM

## 2024-05-23 NOTE — Plan of Care (Signed)
  Problem: Pain Managment: Goal: General experience of comfort will improve and/or be controlled Outcome: Not Progressing   Problem: Safety: Goal: Ability to remain free from injury will improve Outcome: Progressing   Problem: Respiratory: Goal: Respiratory status will improve Outcome: Not Progressing   Problem: Urinary Elimination: Goal: Ability to achieve and maintain adequate renal perfusion and functioning will improve Outcome: Not Progressing   Problem: Tissue Perfusion: Goal: Adequacy of tissue perfusion will improve Outcome: Progressing

## 2024-05-23 NOTE — Progress Notes (Signed)
 o  NAME:  Natalie Anderson, MRN:  [id number], DOB:  03-22-1946, LOS: 6 ADMISSION DATE:  05/17/2024, CONSULTATION DATE:  10/28 REFERRING MD:  Dr. Shyrl, CHIEF COMPLAINT:  CABG x 2  History of Present Illness:  Patient is a 78 yo F presents to [facility] w/ DMT2, HTN, HLD presents to [facility] on 10/24.  Patient seen by cardiology for ongoing sob and exertional CP over the past 4-5 weeks. Echo lvef 70% w/ grade 1 diastolic dysfunction. Nuc test shwoing large partially reversible anterior defect. On 10/24 take for cath showing proximal stenosis to both RCA and LAD. Admitted post cath. Started on heparin , asa, statin. TCTS consulted and recommended CABG. On 10/28 patient taken to OR CABGx2. Post op intubated/sedate. Pccm consulted.  Pertinent  Medical History   Past Medical History:  Diagnosis Date   Arthritis    Asthma    as a child   Blood transfusion 07/25/1977   Cataract immature    bilateral   Chronic back pain    spondylosis and stenosis   Complication of anesthesia    stopped breathing after anesthesia   COPD (chronic obstructive pulmonary disease) (HCC)    Diabetes mellitus    takes Metformin  bid and Levimir bid and Victoza daily   Dizziness    occasionally   Dry skin    and itchy    Fibroid tumor    GERD (gastroesophageal reflux disease)    takes Protonix  daily   History of shingles 15-64yrs ago   Hyperlipidemia    takes Lipitor occasionally   Hypertension    takes Losartan daily   Impaired hearing    Joint pain    Joint swelling    Migraine    last one 3 wks ago   Nocturia    Osteoporosis    Osteoporosis    Pneumonia    hx of-double as a child   PONV (postoperative nausea and vomiting)    Seasonal allergies    takes Claritin  daily   Shortness of breath    with exertion   Urinary frequency    UTI (lower urinary tract infection)    hx of   Vertigo    hx of   Vitamin D deficiency    takes Vit D 50,000units on Sat     Significant Hospital Events: Including  procedures, antibiotic start and stop dates in addition to other pertinent events   10/24 cath showing severe 2 vessel disease; admitted by cards 10/28 cabg x2 10/29 patient extubated yesterday afternoon, post extubation ABG 7.337, pCO2 34.9, pO2 56, bicarb 18.7-hypoxemia with metabolic acidosis, patient was placed on trial of high flow nasal cannula and then transition to BiPAP due to no significant improvement in hypoxemia on ABG.  Suspect some of this is due to sepsis versus pulmonary edema 10/30 yesterday afternoon patient developed hyperkalemia with increasing creatinine, decreased urine output, but hypoxemia along with acidosis improving.  Patient did receive correction for hyperkalemia, potassium within normal limits now, patient actually beginning to diurese overnight  Interim History / Subjective:  Postop day 2 CABG two-vessel  Overnight no significant events, patient beginning to auto diurese-made approximately 500 cc of urine Creatinine peaked at 1.67 yesterday on 10/29, did have hyperkalemia yesterday afternoon but was reversed  Patient improving on and hypoxemia standpoint with decreasing oxygen requirements on heated high flow nasal cannula, did utilize BiPAP overnight  Patient weaned off Cleviprex yesterday   Objective   Blood pressure (!) 119/52, pulse 88, temperature (!) 97.5 F (36.4  C), temperature source Axillary, resp. rate (!) 9, height 5' 2.5 (1.588 m), weight 68.4 kg, SpO2 96%. CVP:  [12 mmHg-39 mmHg] 21 mmHg CO:  [3.5 L/min-6.6 L/min] 5.4 L/min CI:  [2.1 L/min/m2-4 L/min/m2] 3.2 L/min/m2  Vent Mode: PCV;BIPAP FiO2 (%):  [50 %-91 %] 50 % Set Rate:  [14 bmp] 14 bmp PEEP:  [8 cmH20] 8 cmH20   Intake/Output Summary (Last 24 hours) at 05/23/2024 0734 Last data filed at 05/23/2024 0700 Gross per 24 hour  Intake 691.71 ml  Output 920 ml  Net -228.29 ml   Filed Weights   05/21/24 0629 05/22/24 0500 05/23/24 0500  Weight: 64.4 kg 70.3 kg 68.4 kg     Examination:  General: acute on chronic older adult female, sitting up in chair in no distress on heated high flow nasal cannula HEENT: Normocephalic, PERRLA intact, Pink MM, poor dentition/missing teeth, HHFNC in nares CV: s1,s2, RRR, no MRG, No JVD, chest tubes in place-serous output minimal, left wrist A-line pulm: clear, diminished, no distress, heated high flow nasal cannula 35 L 57% FiO2 Abs: bs active, soft  Extremities: Generalized edema, no deformity, moves all extremities on command Skin: no rash  Neuro: Rass 0, follows commands, alert and oriented x 4 GU: foley intact  Resolved Hospital Problem list     Assessment & Plan:  Postoperative vent management COPD?: patient states she has hx on prn albuterol; never smoker but exposed through second hand smoke Hypoxemia-resolving Metabolic acidosis-resolving 10/28 patient extubated successfully, initially just requiring nasal cannula but post extubation ABG showing hypoxemia with metabolic acidosis-patient placed on BiPAP after trial of high flow nasal cannula 10/29 ABG pH 7.305, pCO2 send 37.4, pO2 63, bicarb 18.6-patient on 70% FiO2 Patient maintaining O2 sats between 94 to 95%, patient not any distress Chest x-ray showing low lung volumes, atelectasis, trace pleural effusions -Patient improving on a hypoxemia/metabolic acidosis standpoint-yesterday patient received a total of 3 amps of bicarb, patient tolerating weaning heated high flow nasal cannula, still utilizing BiPAP at night which is helping P: Continue to wean heated high flow nasal cannula, hoping with diuresis below and BiPAP overnight, will be able to wean oxygen needs throughout the day Continue BiPAP overnight and when patient taking naps Discussed with the TCTS in regards to patient beginning to increase urine output, initially gave a total of 40 mg of Lasix x 2, with minimal effect and increase in creatinine with hyperkalemia and later on the afternoon.   Overnight patient was started making urine, will attempt with diuresis today as well we will give 60 mg of Lasix Continue to trend renal function panel along with electrolytes, continue to monitor urine output via Foley If O2 needs began to increase again, would recommend obtaining chest x-ray along with obtaining another ABG Continue triple therapy bronchodilators, recommend keeping in ICU for the next couple days  S/p 2-vessel CABG CAD HTN HLD P: Continue postop management per TCTS -Planning to remove epicardial wires today as well as chest tubes Patient off Cleviprex for hypertension Currently on 25 mg metoprolol twice daily Continue aspirin/statin Continue diuresis as above  DMT2 -A1c 6.1 on 10/24 P: Continue sliding scale insulin  Continue CBG checks  AKI Hyperkalemia-resolved on 10/29 with reversal Hyponatremia Creatinine 1.03>1.29>1.67>1.53>1.64 P: Continue to trend renal function daily  Continue to monitor and optimize electrolytes daily Continue to monitor urine output Continue strict I/Os Continue Adequate renal perfusion  Avoid nephrotoxic agents  Continue to utilization of Foley for now due to renal function as well as  monitoring urine output Continue to trend sodium, potassium level at least daily  Expected postop anemia Hemoglobin 8.3 from 8.9 <9.4 P: Continue trending CBC daily Continue to monitor for signs of bleeding Transfuse for hemoglobin less than 7  Peripheral neuropathy P: Continue current pain regimen Home gabapentin  restarted yesterday on 10/29   Labs   CBC: Recent Labs  Lab 05/21/24 1205 05/21/24 1214 05/21/24 1800 05/21/24 1850 05/22/24 0525 05/22/24 0552 05/22/24 1144 05/22/24 1636 05/22/24 1713 05/23/24 0412  WBC 11.4*  --  15.3*  --  17.6*  --   --  15.8*  --  13.4*  HGB 8.6*   < > 9.0*   < > 9.4* 9.2* 8.2* 8.9* 8.5* 8.3*  HCT 26.8*   < > 27.1*   < > 28.6* 27.0* 24.0* 27.1* 25.0* 25.3*  MCV 81.2  --  80.9  --  81.5  --    --  80.9  --  80.8  PLT 271  --  331  --  374  --   --  314  --  270   < > = values in this interval not displayed.    Basic Metabolic Panel: Recent Labs  Lab 05/21/24 1800 05/21/24 1850 05/22/24 0525 05/22/24 0552 05/22/24 1144 05/22/24 1636 05/22/24 1713 05/22/24 2124 05/23/24 0412  NA 129*   < > 130*   < > 135 129* 131* 134* 133*  K 4.3   < > 4.1   < > 3.4* 6.5* 6.6* 4.0 4.3  CL 100  --  100  --   --  99  --  99 98  CO2 19*  --  20*  --   --  20*  --  23 23  GLUCOSE 170*  --  145*  --   --  128*  --  110* 130*  BUN 13  --  16  --   --  21  --  23 24*  CREATININE 1.03*  --  1.29*  --   --  1.67*  --  1.53* 1.64*  CALCIUM  8.1*  --  8.3*  --   --  8.0*  --  7.5* 7.6*  MG 3.0*  --  2.4  --   --  2.2  --   --   --   PHOS  --   --   --   --   --   --   --  3.7  --    < > = values in this interval not displayed.   GFR: Estimated Creatinine Clearance: 25.9 mL/min (A) (by C-G formula based on SCr of 1.64 mg/dL (H)). Recent Labs  Lab 05/21/24 1800 05/22/24 0525 05/22/24 1210 05/22/24 1636 05/23/24 0412  WBC 15.3* 17.6*  --  15.8* 13.4*  LATICACIDVEN  --   --  1.2  --   --     Liver Function Tests: Recent Labs  Lab 05/22/24 2124  ALBUMIN 3.1*   No results for input(s): LIPASE, AMYLASE in the last 168 hours. No results for input(s): AMMONIA in the last 168 hours.  ABG    Component Value Date/Time   PHART 7.358 05/22/2024 1713   PCO2ART 35.9 05/22/2024 1713   PO2ART 90 05/22/2024 1713   HCO3 20.2 05/22/2024 1713   TCO2 21 (L) 05/22/2024 1713   ACIDBASEDEF 5.0 (H) 05/22/2024 1713   O2SAT 97 05/22/2024 1713     Coagulation Profile: Recent Labs  Lab 05/20/24 0353 05/21/24 1205  INR 1.0 1.7*  Cardiac Enzymes: No results for input(s): CKTOTAL, CKMB, CKMBINDEX, TROPONINI in the last 168 hours.  HbA1C: Hgb A1c MFr Bld  Date/Time Value Ref Range Status  05/17/2024 05:08 PM 6.1 (H) 4.8 - 5.6 % Final    Comment:    (NOTE) Diagnosis of  Diabetes The following HbA1c ranges recommended by the American Diabetes Association (ADA) may be used as an aid in the diagnosis of diabetes mellitus.  Hemoglobin             Suggested A1C NGSP%              Diagnosis  <5.7                   Non Diabetic  5.7-6.4                Pre-Diabetic  >6.4                   Diabetic  <7.0                   Glycemic control for                       adults with diabetes.      CBG: Recent Labs  Lab 05/22/24 2010 05/22/24 2047 05/22/24 2153 05/22/24 2323 05/23/24 0324  GLUCAP 142* 137* 118* 132* 136*    Critical care time: 50 minutes   Christian Treva Huyett AGACNP-BC   [facility] 05/23/2024, 10:16 AM  Please see [email or web address] for pager details.  From 7A-7P if no response, please call [phone]. After hours, please call ELink [phone].

## 2024-05-23 NOTE — Plan of Care (Signed)
 Progressing well today. HHFNC titrated down to 25L/min and 46 FiO2. Ambulated about 10 ft in the room and tolerated well. Arterial line, chest tubes, and epicardial lines D/C today. Diuresing well.

## 2024-05-23 NOTE — Plan of Care (Signed)
 Progressing Add All Education: Understanding of CV disease, CV risk reduction, and recovery process will improve Add Today at 2357 - Progressing by Rolfe Corean HERO, RN Add Individualized Educational Video(s) Add Today at 2357 - Progressing by Rolfe Corean HERO, RN Add Activity: Ability to return to baseline activity level will improve Add Today at 2357 - Progressing by Rolfe Corean HERO, RN Add Cardiovascular: Ability to achieve and maintain adequate cardiovascular perfusion will improve Add Today at 2357 - Progressing by Rolfe Corean HERO, RN Add Health Behavior/Discharge Planning: Ability to safely manage health-related needs after discharge will improve Add Today at 2357 - Progressing by Rolfe Corean HERO, RN Add Education: Knowledge of General Education information will improve Add Today at 2357 - Progressing by Rolfe Corean HERO, RN Add Health Behavior/Discharge Planning: Ability to manage health-related needs will improve Add Today at 2357 - Progressing by Rolfe Corean HERO, RN Add Clinical Measurements: Ability to maintain clinical measurements within normal limits will improve Add Today at 2357 - Progressing by Rolfe Corean HERO, RN Add Will remain free from infection Add Today at 2357 - Progressing by Rolfe Corean HERO, RN Add Diagnostic test results will improve Add Today at 2357 - Progressing by Rolfe Corean HERO, RN Add Respiratory complications will improve Add Today at 2357 - Progressing by Rolfe Corean HERO, RN Add Cardiovascular complication will be avoided Add Today at 2357 - Progressing by Rolfe Corean HERO, RN Add Activity: Risk for activity intolerance will decrease Add Today at 2357 - Progressing by Rolfe Corean HERO, RN Add Nutrition: Adequate nutrition will be maintained Add Today at 2357 - Progressing by Rolfe Corean HERO, RN Add Elimination: Will not  experience complications related to bowel motility Add Today at 2357 - Progressing by Rolfe Corean HERO, RN Add Will not experience complications related to urinary retention Add Today at 2357 - Progressing by Rolfe Corean HERO, RN Add Pain Managment: General experience of comfort will improve and/or be controlled Add Today at 2357 - Progressing by Rolfe Corean HERO, RN Add Safety: Ability to remain free from injury will improve Add Today at 2357 - Progressing by Rolfe Corean HERO, RN Add Skin Integrity: Risk for impaired skin integrity will decrease Add Today at 2357 - Progressing by Rolfe Corean HERO, RN Add Education: Will demonstrate proper wound care and an understanding of methods to prevent future damage Add Today at 2357 - Progressing by Rolfe Corean HERO, RN Add Knowledge of disease or condition will improve Add Today at 2357 - Progressing by Rolfe Corean HERO, RN Add Knowledge of the prescribed therapeutic regimen will improve Add Today at 2357 - Progressing by Rolfe Corean HERO, RN Add Individualized Educational Video(s) Add Today at 2357 - Progressing by Rolfe Corean HERO, RN Add Activity: Risk for activity intolerance will decrease Add Today at 2357 - Progressing by Rolfe Corean HERO, RN Add Cardiac: Will achieve and/or maintain hemodynamic stability Add Today at 2357 - Progressing by Rolfe Corean HERO, RN Add Clinical Measurements: Postoperative complications will be avoided or minimized Add Today at 2357 - Progressing by Rolfe Corean HERO, RN Add Respiratory: Respiratory status will improve Add Today at 2357 - Progressing by Rolfe Corean HERO, RN Add Skin Integrity: Wound healing without signs and symptoms of infection Add Today at 2357 - Progressing by Rolfe Corean HERO, RN Add Risk for impaired skin integrity will decrease Add Today at 2357 - Progressing by  Rolfe Corean HERO, RN Add Urinary Elimination: Ability to achieve and maintain adequate renal perfusion and  functioning will improve Add Today at 2357 - Progressing by Rolfe Corean HERO, RN Add Education: Ability to describe self-care measures that may prevent or decrease complications (Diabetes Survival Skills Education) will improve Add Today at 2357 - Progressing by Rolfe Corean HERO, RN Add Individualized Educational Video(s) Add Today at 2357 - Progressing by Rolfe Corean HERO, RN Add Coping: Ability to adjust to condition or change in health will improve Add Today at 2357 - Progressing by Rolfe Corean HERO, RN Add Fluid Volume: Ability to maintain a balanced intake and output will improve Add Today at 2357 - Progressing by Rolfe Corean HERO, RN Add Health Behavior/Discharge Planning: Ability to identify and utilize available resources and services will improve Add Today at 2357 - Progressing by Rolfe Corean HERO, RN Add Ability to manage health-related needs will improve Add Today at 2357 - Progressing by Rolfe Corean HERO, RN Add Metabolic: Ability to maintain appropriate glucose levels will improve Add Today at 2357 - Progressing by Rolfe Corean HERO, RN Add Nutritional: Maintenance of adequate nutrition will improve Add Today at 2357 - Progressing by Rolfe Corean HERO, RN Add Progress toward achieving an optimal weight will improve Add Today at 2357 - Progressing by Rolfe Corean HERO, RN Add Skin Integrity: Risk for impaired skin integrity will decrease Add Today at 2357 - Progressing by Rolfe Corean HERO, RN Add Tissue Perfusion: Adequacy of tissue perfusion will improve Add Today at 2357 - Progressing by Rolfe Corean HERO, RN

## 2024-05-24 DIAGNOSIS — N179 Acute kidney failure, unspecified: Secondary | ICD-10-CM | POA: Diagnosis not present

## 2024-05-24 DIAGNOSIS — Z951 Presence of aortocoronary bypass graft: Secondary | ICD-10-CM

## 2024-05-24 DIAGNOSIS — J9601 Acute respiratory failure with hypoxia: Secondary | ICD-10-CM | POA: Diagnosis not present

## 2024-05-24 LAB — COMPREHENSIVE METABOLIC PANEL WITH GFR
ALT: <5 U/L (ref 0–44)
AST: 30 U/L (ref 15–41)
Albumin: 2.9 g/dL — ABNORMAL LOW (ref 3.5–5.0)
Alkaline Phosphatase: 41 U/L (ref 38–126)
Anion gap: 9 (ref 5–15)
BUN: 26 mg/dL — ABNORMAL HIGH (ref 8–23)
CO2: 25 mmol/L (ref 22–32)
Calcium: 7.8 mg/dL — ABNORMAL LOW (ref 8.9–10.3)
Chloride: 95 mmol/L — ABNORMAL LOW (ref 98–111)
Creatinine, Ser: 1.51 mg/dL — ABNORMAL HIGH (ref 0.44–1.00)
GFR, Estimated: 35 mL/min — ABNORMAL LOW (ref >60)
Glucose, Bld: 105 mg/dL — ABNORMAL HIGH (ref 70–99)
Potassium: 4.1 mmol/L (ref 3.5–5.1)
Sodium: 129 mmol/L — ABNORMAL LOW (ref 135–145)
Total Bilirubin: 1 mg/dL (ref 0.0–1.2)
Total Protein: 5.5 g/dL — ABNORMAL LOW (ref 6.5–8.1)

## 2024-05-24 LAB — TYPE AND SCREEN
ABO/RH(D): O POS
Antibody Screen: NEGATIVE
Unit division: 0
Unit division: 0

## 2024-05-24 LAB — BPAM RBC
Blood Product Expiration Date: 202511032359
Blood Product Expiration Date: 202511032359
Unit Type and Rh: 5100
Unit Type and Rh: 5100

## 2024-05-24 LAB — CBC WITH DIFFERENTIAL/PLATELET
Abs Immature Granulocytes: 0.04 K/uL (ref 0.00–0.07)
Basophils Absolute: 0 K/uL (ref 0.0–0.1)
Basophils Relative: 0 %
Eosinophils Absolute: 0.1 K/uL (ref 0.0–0.5)
Eosinophils Relative: 1 %
HCT: 25.2 % — ABNORMAL LOW (ref 36.0–46.0)
Hemoglobin: 8.1 g/dL — ABNORMAL LOW (ref 12.0–15.0)
Immature Granulocytes: 0 %
Lymphocytes Relative: 24 %
Lymphs Abs: 2.3 K/uL (ref 0.7–4.0)
MCH: 26.5 pg (ref 26.0–34.0)
MCHC: 32.1 g/dL (ref 30.0–36.0)
MCV: 82.4 fL (ref 80.0–100.0)
Monocytes Absolute: 1.1 K/uL — ABNORMAL HIGH (ref 0.1–1.0)
Monocytes Relative: 11 %
Neutro Abs: 6.2 K/uL (ref 1.7–7.7)
Neutrophils Relative %: 64 %
Platelets: 274 K/uL (ref 150–400)
RBC: 3.06 MIL/uL — ABNORMAL LOW (ref 3.87–5.11)
RDW: 13.9 % (ref 11.5–15.5)
WBC: 9.7 K/uL (ref 4.0–10.5)
nRBC: 0 % (ref 0.0–0.2)

## 2024-05-24 LAB — GLUCOSE, CAPILLARY
Glucose-Capillary: 111 mg/dL — ABNORMAL HIGH (ref 70–99)
Glucose-Capillary: 157 mg/dL — ABNORMAL HIGH (ref 70–99)
Glucose-Capillary: 105 mg/dL — ABNORMAL HIGH (ref 70–99)
Glucose-Capillary: 177 mg/dL — ABNORMAL HIGH (ref 70–99)

## 2024-05-24 MED ORDER — METOLAZONE 2.5 MG PO TABS
2.5000 mg | ORAL_TABLET | Freq: Once | ORAL | Status: AC
  Administered 2024-05-24: 2.5 mg via ORAL
  Filled 2024-05-24: qty 1

## 2024-05-24 MED ORDER — ENSURE PLUS HIGH PROTEIN PO LIQD
237.0000 mL | Freq: Two times a day (BID) | ORAL | Status: DC
  Administered 2024-05-25 – 2024-05-28 (×3): 237 mL via ORAL

## 2024-05-24 MED ORDER — FUROSEMIDE 10 MG/ML IJ SOLN
60.0000 mg | Freq: Once | INTRAMUSCULAR | Status: AC
  Administered 2024-05-24: 60 mg via INTRAVENOUS
  Filled 2024-05-24: qty 6

## 2024-05-24 MED FILL — Sodium Chloride IV Soln 0.9%: INTRAVENOUS | Qty: 1000 | Status: AC

## 2024-05-24 NOTE — Progress Notes (Signed)
 TCTS Evening Rounds:  Hemodynamically stable Oxygenation much better.  Diuresing well. Bowels working.

## 2024-05-24 NOTE — Progress Notes (Signed)
 [address]       [place] [address]             [phone]                 3 Days Post-Op Procedure(s) (LRB): OFF PUMP CORONARY ARTERY BYPASS GRAFTING X 2, USING LEFT INTERNAL MAMMARY ARTERY AND ENDOSCOPIC HARVESTED LEFT GREATER SAPHENOUS VEIN (N/A)   Events: No events _______________________________________________________________ Vitals: BP (!) 117/56 (BP Location: Right Arm)   Pulse 82   Temp 98.4 F (36.9 C) (Oral)   Resp 15   Ht 5' 2.5 (1.588 m)   Wt 72.8 kg   SpO2 100%   BMI 28.89 kg/m  Filed Weights   05/22/24 0500 05/23/24 0500 05/24/24 0600  Weight: 70.3 kg 68.4 kg 72.8 kg     - Neuro: alert NAD  - Cardiovascular: sinus  Drips: none.   CVP:  [10 mmHg-57 mmHg] 15 mmHg  - Pulm: EWOB on HHFNC  ABG    Component Value Date/Time   PHART 7.358 05/22/2024 1713   PCO2ART 35.9 05/22/2024 1713   PO2ART 90 05/22/2024 1713   HCO3 20.2 05/22/2024 1713   TCO2 21 (L) 05/22/2024 1713   ACIDBASEDEF 5.0 (H) 05/22/2024 1713   O2SAT 97 05/22/2024 1713    - Abd: ND - Extremity: trace edema  .Intake/Output      10/30 0701 10/31 0700 10/31 0701 11/01 0700   P.O. 480 480   I.V. (mL/kg) 10 (0.1)    Other 55    IV Piggyback 100    Total Intake(mL/kg) 645 (8.9) 480 (6.6)   Urine (mL/kg/hr) 1980 (1.1) 1275 (2)   Stool  0   Chest Tube 60    Total Output 2040 1275   Net -1395 -795        Stool Occurrence  2 x      _______________________________________________________________ Labs:    Latest Ref Rng & Units 05/24/2024    4:02 AM 05/23/2024    4:12 AM 05/22/2024    5:13 PM  CBC  WBC 4.0 - 10.5 K/uL 9.7  13.4    Hemoglobin 12.0 - 15.0 g/dL 8.1  8.3  8.5   Hematocrit 36.0 - 46.0 % 25.2  25.3  25.0   Platelets 150 - 400 K/uL 274  270        Latest Ref Rng & Units 05/24/2024    4:02 AM 05/23/2024    4:14 PM 05/23/2024    4:12 AM  CMP  Glucose 70 - 99 mg/dL 894  86  869   BUN 8 - 23 mg/dL 26  27  24    Creatinine 0.44 -  1.00 mg/dL 8.48  8.45  8.35   Sodium 135 - 145 mmol/L 129  129  133   Potassium 3.5 - 5.1 mmol/L 4.1  3.6  4.3   Chloride 98 - 111 mmol/L 95  92  98   CO2 22 - 32 mmol/L 25  24  23    Calcium  8.9 - 10.3 mg/dL 7.8  8.2  7.6   Total Protein 6.5 - 8.1 g/dL 5.5     Total Bilirubin 0.0 - 1.2 mg/dL 1.0     Alkaline Phos 38 - 126 U/L 41     AST 15 - 41 U/L 30     ALT 0 - 44 U/L <5       CXR: -  _______________________________________________________________  Assessment and Plan: POD 3 s/p CABG  Neuro:  pain controlled CV: on A/S/BB.  Will remove wires Pulm: IS, ambulation Renal: creat up slightly.  Will continue diuesis GI: on diet Heme: stable ID: [email or web address] Endo: SSI Dispo: continue ICU care.   Natalie Anderson 05/24/2024 3:40 PM

## 2024-05-24 NOTE — Discharge Summary (Signed)
 [address] [place] [address]             [phone]        Physician Discharge Summary  Patient ID: Natalie Anderson MRN: [id number] DOB/AGE: [birth year] 78 y.o.  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 is a 78 year old female with a past history notable for hypertension, dyslipidemia, type 2 diabetes mellitus, chronic back pain, gastroesophageal reflux disease.  She is a lifelong non-smoker but was exposed to second hand smoke in her home for 50 years and said she has COPD as a result.  She was recently evaluated by her PCP, Dr. Loreli, for exertional chest pain with shortness of breath ongoing for 4-5 weeks.  Outpatient evaluation included an echocardiogram showing an ejection fraction of greater than 70% with no wall motion abnormality.  There was grade 1 diastolic dysfunction but no valvular pathology.  She went on to have a nuclear stress test showing a large partially reversible anterior defect.  This prompted  referral to Dr. Alvan, cardiologist, for further evaluation.  He recommended left heart catheterization which was performed earlier today demonstrating proximal stenoses in both the RCA and LAD as detailed below.   [name] describes having worsening chest pain on a daily basis that occurs now with minimal exertion such as just walking between 2 rooms or bending down to pick up something off the floor.   Ms. Henton is widowed but has continued to live alone and care for herself for the past 15 years since her husband died.  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  She states she had good family support and would have assistance after she leaves the hospital.   Past surgical history is notable for 2 back surgeries for herniated disc with residual chronic weakness in her right lower extremity.  She also has an esophageal stricture that has been dilated on the occasions and she said it was due to be done again in the near future.  Following full review of the patient and all relevant studies Dr. Shyrl recommended CABG.  Hospital course:  Following completion of her diagnostic workup and medical stabilization she was felt to be stable to proceed and on 05/21/2024 she was taken the operating room at which time she underwent off-pump CABG x 2. LIMA-LAD and a saphenous vein-RCA graft were placed.  Patient tolerated the procedure well was taken  to the surgical intensive care unit in stable condition.  Postoperative hospital course:  Patient was extubated using standard postcardiac surgical protocols without difficulty.  She did however require early initiation of BiPAP for poor oxygenation.  This has improved over time.  By postop day 2 she was on high flow heated nasal cannula with BiPAP at night.  Earlier this was associated with some metabolic acidosis which is also resolving.  She  did require Cleviprex for blood pressure management.  This was able to be weaned and she has been started on oral metoprolol as well as aspirin and statin.  She did require some additional amps of bicarb.  The critical care service was consulted to assist with ICU management.  She did have some expected postoperative volume overload and is been diuresed accordingly.  She does have an AKI and creatinine peaked at 1.64.  This recent value was 1.18.  On postop day 3 she continued to require high flow nasal cannula at 8 L.  She also continued with nocturnal BiPAP.  Her respiratory status improved.  She was felt stable for transfer to the progressive care unit on 05/25/2024.  The patient had issues with nausea and scheduled reglan was added to provide relief.  She was tachycardic and her lopressor as titrated as tolerated to obtain better HR control.  She has had some elevated blood pressure readings and Norvasc was resumed.  ARB has not been resumed due to the recent AKI and she is noted to have good blood pressure control currently.  Of asked PT and OT to evaluate for home needs.  Home equipment has been arranged.  Incisions are healing well without evidence of infection.  She is tolerating diet although not eating normal amounts due to some occasional nausea and lack of appetite.  Sugars have been under good control.  She will be started on a lower home dose of insulin  than previously and she has the ability to home titrate as she is very familiar with adjusting dosing based on blood sugars.  And she will be able she is off oxygen and maintaining good saturations on room air.  Home inhalers will be resumed.  Overall, at the time of discharge the patient is felt to be quite stable.  Consults: pulmonary/intensive care  Significant Diagnostic Studies:  DG Chest 2 View Result Date: 05/26/2024 CLINICAL DATA:  Postop from CABG. EXAM: CHEST - 2 VIEW COMPARISON:  05/22/2024 FINDINGS: Prior CABG. Heart size is normal.  Right jugular central venous catheter is been removed. No pneumothorax visualized. Improved aeration of both lungs is seen, with decreased left lower lobe atelectasis. Mild atelectasis is seen in the right perihilar region. Tiny bilateral pleural effusions also noted. IMPRESSION: Improved aeration of both lungs, with decreased left lower lobe atelectasis. Mild right perihilar atelectasis and tiny bilateral pleural effusions. Electronically Signed   By: Norleen DELENA Kil M.D.   On: 05/26/2024 07:50   DG Chest Port 1 View Result Date: 05/22/2024 EXAM: 1 VIEW XRAY OF THE CHEST 05/22/2024 05:28:00 AM COMPARISON: 05/21/2024 CLINICAL HISTORY: S/P CABG x 2 FINDINGS: LINES, TUBES AND DEVICES: Right internal jugular central venous catheter in place with tip overlying the right atrium. Interval extubation and removal of enteric tube. Mediastinal drain in place. Left basilar chest tube in place. LUNGS AND PLEURA: Low lung volumes. Bibasilar streaky airspace opacities, increased from prior exam. Possible trace bilateral pleural effusions. No pneumothorax. HEART AND MEDIASTINUM: No acute abnormality of the cardiac and mediastinal silhouettes. BONES AND SOFT TISSUES:  Median sternotomy noted. Old left rib fractures. IMPRESSION: 1. Interval extubation and removal of enteric tube. 2. Low lung volumes with increased bibasilar airspace opacities from prior exam. 3. Possible trace bilateral pleural effusions. 4. Right internal jugular central venous catheter, mediastinal drain, and left basilar chest tube in expected positions. Electronically signed by: Katheleen Faes MD 05/22/2024 09:20 AM EDT RP Workstation: [id number]   DG Chest Port 1 View Result Date: 05/21/2024 EXAM: 1 VIEW(S) XRAY OF THE CHEST 05/21/2024 12:29:00 PM COMPARISON: Comparison 05/19/2024. CLINICAL HISTORY: [id number] S/P CABG x 2 [id number]. S/P CABG FINDINGS: LINES, TUBES AND DEVICES: Endotracheal and nasogastric tubes are in good position. Right internal jugular catheter  is noted with tip in SVC. LUNGS AND PLEURA: Minimal left basilar subsegmental atelectasis or scarring is noted. No pulmonary edema. No pleural effusion. No pneumothorax. HEART AND MEDIASTINUM: No acute abnormality of the cardiac and mediastinal silhouettes. BONES AND SOFT TISSUES: Old left rib fractures are again noted. IMPRESSION: 1. Endotracheal and nasogastric tubes in appropriate position. 2. Right internal jugular catheter tip in the superior vena cava. 3. Minimal left basilar subsegmental atelectasis or scarring. 4. Old left rib fractures. Electronically signed by: Lynwood Seip MD 05/21/2024 12:59 PM EDT RP Workstation: [id number]   VAS US  DOPPLER PRE CABG Result Date: 05/20/2024 PREOPERATIVE VASCULAR EVALUATION Patient Name:  AIRYANA SPRUNGER  Date of Exam:   05/20/2024 Medical Rec #: [id number]      Accession #:    [id number] Date of Birth: 09-08-1945      Patient Gender: F Patient Age:   45 years Exam Location:  [facility] Procedure:      VAS US  DOPPLER PRE CABG Referring Phys: HARRELL LIGHTFOOT --------------------------------------------------------------------------------  Indications:      Pre-CABG. Risk Factors:     Hypertension, hyperlipidemia, Diabetes. Comparison Study: No prior studies. Performing Technologist: Gerome Ny RVT  Examination Guidelines: A complete evaluation includes B-mode imaging, spectral Doppler, color Doppler, and power Doppler as needed of all accessible portions of each vessel. Bilateral testing is considered an integral part of a complete examination. Limited examinations for reoccurring indications may be performed as noted.  Right Carotid Findings: +----------+-------+-------+--------+---------------------------------+--------+           PSV    EDV    StenosisDescribe                         Comments           cm/s   cm/s                                                      +----------+-------+-------+--------+---------------------------------+--------+ CCA Prox  83     9              smooth and heterogenous                   +----------+-------+-------+--------+---------------------------------+--------+ CCA Distal75     13             smooth and heterogenous                   +----------+-------+-------+--------+---------------------------------+--------+ ICA Prox  77     18             irregular, calcific and  heterogenous                              +----------+-------+-------+--------+---------------------------------+--------+ ICA Mid   130    22                                                       +----------+-------+-------+--------+---------------------------------+--------+ ICA Distal68     21                                              tortuous +----------+-------+-------+--------+---------------------------------+--------+ ECA       187    10                                                       +----------+-------+-------+--------+---------------------------------+--------+ +----------+--------+-------+--------+------------+           PSV cm/sEDV cmsDescribeArm Pressure +----------+--------+-------+--------+------------+ Subclavian99                                  +----------+--------+-------+--------+------------+ +---------+--------+--+--------+--+---------+ VertebralPSV cm/s62EDV cm/s13Antegrade +---------+--------+--+--------+--+---------+ Left Carotid Findings: +----------+--------+-------+--------+--------------------------------+--------+           PSV cm/sEDV    StenosisDescribe                        Comments                   cm/s                                                    +----------+--------+-------+--------+--------------------------------+--------+ CCA Prox  62      13             smooth and heterogenous                   +----------+--------+-------+--------+--------------------------------+--------+ CCA Distal81      17             calcific                                 +----------+--------+-------+--------+--------------------------------+--------+ ICA Prox  71      20             calcific, smooth and                                                      heterogenous                             +----------+--------+-------+--------+--------------------------------+--------+ ICA Mid   56  18             calcific                                 +----------+--------+-------+--------+--------------------------------+--------+ ICA Distal79      21                                             tortuous +----------+--------+-------+--------+--------------------------------+--------+ ECA       237     16                                                      +----------+--------+-------+--------+--------------------------------+--------+ +----------+--------+--------+--------+------------+ SubclavianPSV cm/sEDV cm/sDescribeArm Pressure +----------+--------+--------+--------+------------+           127                                  +----------+--------+--------+--------+------------+ +---------+--------+--+--------+--+---------+ VertebralPSV cm/s37EDV cm/s10Antegrade +---------+--------+--+--------+--+---------+  ABI Findings: +------------------+-----+-----------+ Rt Pressure (mmHg)IndexWaveform    +------------------+-----+-----------+ 136                    triphasic   +------------------+-----+-----------+ 69                0.51 monophasic  +------------------+-----+-----------+ 99                0.73 multiphasic +------------------+-----+-----------+ +------------------+-----+-----------+ Lt Pressure (mmHg)IndexWaveform    +------------------+-----+-----------+ 123                    triphasic   +------------------+-----+-----------+  68                0.50 monophasic  +------------------+-----+-----------+ 91                0.67 multiphasic +------------------+-----+-----------+ +-------+---------------+ ABI/TBIToday's ABI/TBI +-------+---------------+ Right  0.73            +-------+---------------+ Left   0.67            +-------+---------------+  Right Doppler Findings: +--------+--------+---------+ Site    PressureDoppler   +--------+--------+---------+ [id number]     triphasic +--------+--------+---------+ Radial          triphasic +--------+--------+---------+ Ulnar           triphasic +--------+--------+---------+  Left Doppler Findings: +--------+--------+---------+ Site    PressureDoppler   +--------+--------+---------+ [id number]     triphasic +--------+--------+---------+ Radial          triphasic +--------+--------+---------+ Ulnar           triphasic +--------+--------+---------+   Summary: Right Carotid: Velocities in the right ICA are consistent with a 1-39% stenosis. Left Carotid: Velocities in the left ICA are consistent with a 1-39% stenosis. Vertebrals: Bilateral vertebral arteries demonstrate antegrade flow. Right ABI: Resting right ankle-brachial index indicates moderate right lower extremity arterial disease. Left ABI: Resting left ankle-brachial index indicates moderate left lower extremity arterial disease. Right Upper Extremity: Doppler waveforms remain within normal limits with right radial compression. Doppler waveform obliterate with right ulnar compression. Left Upper Extremity: Doppler waveform obliterate with left radial compression. Doppler waveforms remain within normal limits with left ulnar compression.  Electronically signed  by Penne Colorado MD on 05/20/2024 at 4:03:32 PM.    Final    DG Chest 2 View Result Date: 05/19/2024 CLINICAL DATA:  Preop exam. EXAM: DG CHEST 2V COMPARISON:  Radiographs 12/31/2010 FINDINGS: Low lung volumes. Normal heart size for  technique. Minor basilar atelectasis. No confluent opacity. No pulmonary edema, pneumothorax or significant pleural effusion. Remote left rib fractures. IMPRESSION: Low lung volumes with minor basilar atelectasis. Electronically Signed   By: Andrea Gasman M.D.   On: 05/19/2024 12:05   CARDIAC CATHETERIZATION Result Date: 05/18/2024 Table formatting from the original result was not included. Images from the original result were not included.   Mid LM to Ost LAD lesion is 30% stenosed with 20% stenosed side branch in Ost Cx to Prox Cx.   Ost LAD to Prox LAD lesion is 99% stenosed. Prox LAD to Mid LAD lesion is 95% stenosed. Mid LAD lesion is 70% stenosed with 30% stenosed side branch in 1st Diag.  Distally the LAD has TIMI II flow.  The LAD branches into a septal perforator, interventricular branch and major diagonal branch which reaches the apex.   Ost RCA lesion is 60% stenosed. Prox RCA-1 lesion is 70% stenosed. Prox RCA-2 lesion is 50% stenosed.   Remainder the LCx and RCA relatively free of disease.   -----------------------------------------   LV end diastolic pressure is normal. Dominance: Right PLAN OF CARE:  Plan for TCTS Consult to consider CABG 2/2 Severe LAD & RCA disease not favorable for PCI. Admit to inpatient  -> start heparin  2 hours after TR band removal.  TCTS consultation.  Continue home medications.  Hold metformin  and use sliding scale insulin  Alm MICAEL Clay, MD, MS Alm Clay, M.D., M.S. Interventional Cardiologist [facility] Pager # [phone]   Results for orders placed or performed during the hospital encounter of 05/17/24 (from the past 48 hours)  Glucose, capillary     Status: Abnormal   Collection Time: 05/26/24 12:59 PM  Result Value Ref Range   Glucose-Capillary 218 (H) 70 - 99 mg/dL    Comment: Glucose reference range applies only to samples taken after fasting for at least 8 hours.  Glucose, capillary     Status: Abnormal   Collection Time: 05/26/24   5:22 PM  Result Value Ref Range   Glucose-Capillary 133 (H) 70 - 99 mg/dL    Comment: Glucose reference range applies only to samples taken after fasting for at least 8 hours.  Glucose, capillary     Status: Abnormal   Collection Time: 05/26/24  9:38 PM  Result Value Ref Range   Glucose-Capillary 149 (H) 70 - 99 mg/dL    Comment: Glucose reference range applies only to samples taken after fasting for at least 8 hours.  Glucose, capillary     Status: Abnormal   Collection Time: 05/27/24  7:07 AM  Result Value Ref Range   Glucose-Capillary 150 (H) 70 - 99 mg/dL    Comment: Glucose reference range applies only to samples taken after fasting for at least 8 hours.  Glucose, capillary     Status: Abnormal   Collection Time: 05/27/24 11:59 AM  Result Value Ref Range   Glucose-Capillary 169 (H) 70 - 99 mg/dL    Comment: Glucose reference range applies only to samples taken after fasting for at least 8 hours.   Comment 1 Notify RN    Comment 2 Document in Chart   Glucose, capillary     Status: Abnormal   Collection Time: 05/27/24  4:45 PM  Result Value Ref Range   Glucose-Capillary 148 (H) 70 - 99 mg/dL    Comment: Glucose reference range applies only to samples taken after fasting for at least 8 hours.   Comment 1 Notify RN    Comment 2 Document in Chart   Glucose, capillary     Status: Abnormal   Collection Time: 05/27/24  9:10 PM  Result Value Ref Range   Glucose-Capillary 139 (H) 70 - 99 mg/dL    Comment: Glucose reference range applies only to samples taken after fasting for at least 8 hours.   Comment 1 Notify RN    Comment 2 Document in Chart   CBC     Status: Abnormal   Collection Time: 05/28/24  2:51 AM  Result Value Ref Range   WBC 7.4 4.0 - 10.5 K/uL   RBC 3.47 (L) 3.87 - 5.11 MIL/uL   Hemoglobin 9.0 (L) 12.0 - 15.0 g/dL   HCT 71.9 (L) 63.9 - 53.9 %   MCV 80.7 80.0 - 100.0 fL   MCH 25.9 (L) 26.0 - 34.0 pg   MCHC 32.1 30.0 - 36.0 g/dL   RDW 86.6 88.4 - 84.4 %    Platelets 326 150 - 400 K/uL   nRBC 0.0 0.0 - 0.2 %    Comment: Performed at [facility], [address], [place], [place] [address]  Basic metabolic panel with GFR     Status: Abnormal   Collection Time: 05/28/24  2:51 AM  Result Value Ref Range   Sodium 133 (L) 135 - 145 mmol/L   Potassium 4.0 3.5 - 5.1 mmol/L   Chloride 93 (L) 98 - 111 mmol/L   CO2 27 22 - 32 mmol/L   Glucose, Bld 177 (H) 70 - 99 mg/dL    Comment: Glucose reference range applies only to samples taken after fasting for at least 8 hours.   BUN 16 8 - 23 mg/dL   Creatinine, Ser 8.81 (H) 0.44 - 1.00 mg/dL   Calcium  8.5 (L) 8.9 - 10.3 mg/dL   GFR, Estimated 47 (L) >60 mL/min    Comment: (NOTE) Calculated using the CKD-EPI Creatinine Equation (2021)    Anion gap 13 5 - 15    Comment: Performed at [facility], [address] [place], [place] [address]  Glucose, capillary     Status: Abnormal   Collection Time: 05/28/24  6:03 AM  Result Value Ref Range   Glucose-Capillary 173 (H) 70 - 99 mg/dL    Comment: Glucose reference range applies only to samples taken after fasting for at least 8 hours.   Comment 1 Notify RN    Comment 2 Document in Chart   Glucose, capillary     Status: Abnormal   Collection Time: 05/28/24  8:01 AM  Result Value Ref Range   Glucose-Capillary 231 (H) 70 - 99 mg/dL    Comment: Glucose reference range applies only to samples taken after fasting for at least 8 hours.   Comment 1 Notify RN    Comment 2 Document in Chart     Treatments: surgery:   05/21/2024       Patient:  Ronal POUR Gouveia Pre-Op Dx: LILLA CAD                           HLP  HTN                         COPD   Post-op Dx:  same Procedure: Off pump CABG X 2.  LIMA LAD, RSVG distal RCA   Endoscopic greater saphenous vein harvest on the left     Surgeon and Role:      * Lightfoot, Linnie KIDD, MD - Primary    DEWAINE MICAEL Cera, PA-C - assisting  Discharge Exam: Blood pressure 130/79, pulse 66,  temperature 98.6 F (37 C), temperature source Oral, resp. rate 20, height 5' 2.5 (1.588 m), weight 63.7 kg, SpO2 90%.   General appearance: alert, cooperative, and no distress Heart: regular rate and rhythm Lungs: clear to auscultation bilaterally Abdomen: benign Extremities: no edema Wound: incis healing well  Discharge Medications:  The patient has been discharged on:   1.Beta Blocker:  Yes [ y  ]                              No   [   ]                              If No, reason:  2.Ace Inhibitor/ARB: Yes [   ]                                     No  [ n   ]                                     If No, reason:aki  3.Statin:   Yes [ y  ]                  No  [   ]                  If No, reason:  4.Ecasa:  Yes  [  y ]                  No   [   ]                  If No, reason:  Patient had ACS upon admission:n  Plavix/P2Y12 inhibitor: Yes [   ]                                      No  [ n  ]     Discharge Instructions     Amb Referral to Cardiac Rehabilitation   Complete by: As directed    Diagnosis: CABG   CABG X ___: 2   After initial evaluation and assessments completed: Virtual Based Care may be provided alone or in conjunction with Phase 2 Cardiac Rehab based on patient barriers.: Yes   Intensive Cardiac Rehabilitation (ICR) [facility] location only OR Traditional Cardiac Rehabilitation (TCR) *If criteria for ICR are not met will enroll in TCR ([facility] only): Yes      Allergies as of 05/28/2024       Reactions   Shellfish Allergy Shortness Of Breath   Shrimp allergy   Latex Rash  Blisters   Morphine  And Codeine Itching, Nausea And Vomiting   Other Hives, Rash   Strawberries and oranges   Amoxicillin Other (See Comments)   Caused mouth sores, and Gastric issues    Prednisone    Makes me bleed out of my mouth   Nickel Rash   Penicillins Hives        Medication List     STOP taking these medications    losartan 100 MG tablet Commonly known as:  COZAAR       TAKE these medications    albuterol 108 (90 Base) MCG/ACT inhaler Commonly known as: VENTOLIN HFA Inhale 2 puffs into the lungs every 6 (six) hours as needed for wheezing or shortness of breath.   amLODipine 5 MG tablet Commonly known as: NORVASC Take 5 mg by mouth at bedtime.   aspirin EC 325 MG tablet Take 1 tablet (325 mg total) by mouth daily. What changed:  medication strength how much to take when to take this additional instructions   fluticasone  50 MCG/ACT nasal spray Commonly known as: FLONASE Place 1 spray into both nostrils in the morning and at bedtime.   gabapentin  300 MG capsule Commonly known as: NEURONTIN  Take 2 capsules (600 mg total) by mouth 2 (two) times daily.   loratadine  10 MG tablet Commonly known as: CLARITIN  Take 10 mg by mouth daily.   metFORMIN  500 MG tablet Commonly known as: GLUCOPHAGE  Take 1,000 mg by mouth 2 (two) times daily with a meal.   metoprolol tartrate 50 MG tablet Commonly known as: LOPRESSOR Take 1 tablet (50 mg total) by mouth 2 (two) times daily.   Mounjaro 2.5 MG/0.5ML Pen Generic drug: tirzepatide Inject 2.5 mg into the skin once a week. Fridays   oxyCODONE  5 MG immediate release tablet Commonly known as: Oxy IR/ROXICODONE  Take 1 tablet (5 mg total) by mouth every 6 (six) hours as needed for up to 7 days for severe pain (pain score 7-10).   pantoprazole  20 MG tablet Commonly known as: PROTONIX  Take 1 tablet (20 mg total) by mouth daily.   rosuvastatin 5 MG tablet Commonly known as: CRESTOR Take 5 mg by mouth once a week. Wednesdays   [place] FlexTouch 200 UNIT/ML FlexTouch Pen Generic drug: insulin  degludec Inject 16 Units into the skin in the morning. What changed: how much to take   Vitamin D-3 125 MCG (5000 UT) Tabs Take 5,000 Units by mouth at bedtime.               Durable Medical Equipment  (From admission, onward)           Start     Ordered   05/27/24 1518  For home  use only DME 3 n 1  Once        05/27/24 1517   05/27/24 1517  For home use only DME Walker rolling  Once       Question Answer Comment  Walker: With 5 Inch Wheels   Patient needs a walker to treat with the following condition Physical deconditioning   Patient needs a walker to treat with the following condition S/P CABG (coronary artery bypass graft)      05/27/24 1517            Contact information for follow-up providers     [facility]) Follow up.   Specialty: Home Health Services Why: HH arranged with TCTS office referral- they will contact  you to schedule Contact information: [address]  [place]  [address] [phone]             Contact information for after-discharge care     Durable Medical Equipment     [facility] (DME) Follow up.   Service: Durable Medical Equipment Why: Rolling walker and bedside commode arranged- to be delivered to pt at bedside Contact information: [address] [facility] [place]  [address] [phone]                     Signed:  Lemond FORBES Cera, PA-C  05/28/2024, 8:10 AM

## 2024-05-24 NOTE — Progress Notes (Addendum)
 o  NAME:  Natalie Anderson, MRN:  [id number], DOB:  March 02, 1946, LOS: 7 ADMISSION DATE:  05/17/2024, CONSULTATION DATE:  10/28 REFERRING MD:  Dr. Shyrl, CHIEF COMPLAINT:  CABG x 2  History of Present Illness:  Patient is a 78 yo F presents to [facility] w/ DMT2, HTN, HLD presents to [facility] on 10/24.  Patient seen by cardiology for ongoing sob and exertional CP over the past 4-5 weeks. Echo lvef 70% w/ grade 1 diastolic dysfunction. Nuc test shwoing large partially reversible anterior defect. On 10/24 take for cath showing proximal stenosis to both RCA and LAD. Admitted post cath. Started on heparin , asa, statin. TCTS consulted and recommended CABG. On 10/28 patient taken to OR CABGx2. Post op intubated/sedate. Pccm consulted.  Pertinent  Medical History   Past Medical History:  Diagnosis Date   Arthritis    Asthma    as a child   Blood transfusion 07/25/1977   Cataract immature    bilateral   Chronic back pain    spondylosis and stenosis   Complication of anesthesia    stopped breathing after anesthesia   COPD (chronic obstructive pulmonary disease) (HCC)    Diabetes mellitus    takes Metformin  bid and Levimir bid and Victoza daily   Dizziness    occasionally   Dry skin    and itchy    Fibroid tumor    GERD (gastroesophageal reflux disease)    takes Protonix  daily   History of shingles 15-67yrs ago   Hyperlipidemia    takes Lipitor occasionally   Hypertension    takes Losartan daily   Impaired hearing    Joint pain    Joint swelling    Migraine    last one 3 wks ago   Nocturia    Osteoporosis    Osteoporosis    Pneumonia    hx of-double as a child   PONV (postoperative nausea and vomiting)    Seasonal allergies    takes Claritin  daily   Shortness of breath    with exertion   Urinary frequency    UTI (lower urinary tract infection)    hx of   Vertigo    hx of   Vitamin D deficiency    takes Vit D 50,000units on Sat     Significant Hospital Events: Including  procedures, antibiotic start and stop dates in addition to other pertinent events   10/24 cath showing severe 2 vessel disease; admitted by cards 10/28 cabg x2 10/29 patient extubated yesterday afternoon, post extubation ABG 7.337, pCO2 34.9, pO2 56, bicarb 18.7-hypoxemia with metabolic acidosis, patient was placed on trial of high flow nasal cannula and then transition to BiPAP due to no significant improvement in hypoxemia on ABG.  Suspect some of this is due to sepsis versus pulmonary edema 10/30 yesterday afternoon patient developed hyperkalemia with increasing creatinine, decreased urine output, but hypoxemia along with acidosis improving.  Patient did receive correction for hyperkalemia, potassium within normal limits now, patient actually beginning to diurese overnight  Interim History / Subjective:  Almost 2L UOP  HDS BiPAP overnight  CVPs in the mid-high teens. Cr 1.5 K SML Na 129. Hgb 8.1    Feels like she needs to have a BM  Objective   Blood pressure 93/81, pulse (!) 108, temperature (!) 97.3 F (36.3 C), temperature source Axillary, resp. rate 20, height 5' 2.5 (1.588 m), weight 72.8 kg, SpO2 99%. CVP:  [3 mmHg-57 mmHg] 27 mmHg CO:  [4.1 L/min-7.1 L/min] 5.9 L/min  CI:  [2.5 L/min/m2-4.2 L/min/m2] 3.5 L/min/m2  FiO2 (%):  [38 %-65 %] 65 % PEEP:  [8 cmH20] 8 cmH20 Pressure Support:  [10 cmH20] 10 cmH20   Intake/Output Summary (Last 24 hours) at 05/24/2024 0805 Last data filed at 05/24/2024 0600 Gross per 24 hour  Intake 425 ml  Output 1990 ml  Net -1565 ml   Filed Weights   05/22/24 0500 05/23/24 0500 05/24/24 0600  Weight: 70.3 kg 68.4 kg 72.8 kg    Examination:  General: chronically and acutely ill F HEENT: NCAT HHFNC in place anicteric sclera  CV: tachycardic. S1s2 cap refill brisk  pulm: shallow respirations. Basilar crackles. Abs:  soft + bowel sounds  Extremities: no cyanosis or clubbing  Skin: c/d/w  Neuro: AAOx3 GU: foley   Resolved Hospital  Problem list   Mechanically ventilated  HyperK  Assessment & Plan:    CAD s/p CABG x2 HTN HLD P -post op , L/T/D/W per CVTS  -ASA statin metop  -diurese --  60 Lasix + 2.5 metolazone  -mobility -multimodal analgesia   Acute hypoxia Reported hx asthma  -longstanding orthopnea at home  P -wean O2, goal > 92 -remain in ICU until weaned to 6L [place] -Noct BiPAP  -diurese as above  -IS re-education. Mobility  -on yupelri, brovana + PRN duoneb. Fine keeping for now, but not sure these are providing significant benefit.   AKI HypoNa P -follow renal indices, UOP  DM2 -semglee 10u + ACHS SSI   Anxiety -add PRN atarax  Periph neuropathy -gabapentin    Dysphagia  -soft diet recommended by outpt GI (03/2024 note) while she was awaiting EGD. If any issues w diet here, will change to mech soft  -cont protonix  at discharge (home dose was 20 2/2 subjective myalgias, but is tolerating 40 fine here)  Labs   CBC: Recent Labs  Lab 05/21/24 1800 05/21/24 1850 05/22/24 0525 05/22/24 0552 05/22/24 1144 05/22/24 1636 05/22/24 1713 05/23/24 0412 05/24/24 0402  WBC 15.3*  --  17.6*  --   --  15.8*  --  13.4* 9.7  NEUTROABS  --   --   --   --   --   --   --   --  6.2  HGB 9.0*   < > 9.4*   < > 8.2* 8.9* 8.5* 8.3* 8.1*  HCT 27.1*   < > 28.6*   < > 24.0* 27.1* 25.0* 25.3* 25.2*  MCV 80.9  --  81.5  --   --  80.9  --  80.8 82.4  PLT 331  --  374  --   --  314  --  270 274   < > = values in this interval not displayed.    Basic Metabolic Panel: Recent Labs  Lab 05/21/24 1800 05/21/24 1850 05/22/24 0525 05/22/24 0552 05/22/24 1636 05/22/24 1713 05/22/24 2124 05/23/24 0412 05/23/24 1614 05/24/24 0402  NA 129*   < > 130*   < > 129* 131* 134* 133* 129* 129*  K 4.3   < > 4.1   < > 6.5* 6.6* 4.0 4.3 3.6 4.1  CL 100  --  100  --  99  --  99 98 92* 95*  CO2 19*  --  20*  --  20*  --  23 23 24 25   GLUCOSE 170*  --  145*  --  128*  --  110* 130* 86 105*  BUN 13  --  16  --  21   --  23 24* 27* 26*  CREATININE 1.03*  --  1.29*  --  1.67*  --  1.53* 1.64* 1.54* 1.51*  CALCIUM  8.1*  --  8.3*  --  8.0*  --  7.5* 7.6* 8.2* 7.8*  MG 3.0*  --  2.4  --  2.2  --   --   --   --   --   PHOS  --   --   --   --   --   --  3.7  --   --   --    < > = values in this interval not displayed.   GFR: Estimated Creatinine Clearance: 29 mL/min (A) (by C-G formula based on SCr of 1.51 mg/dL (H)). Recent Labs  Lab 05/22/24 0525 05/22/24 1210 05/22/24 1636 05/23/24 0412 05/24/24 0402  WBC 17.6*  --  15.8* 13.4* 9.7  LATICACIDVEN  --  1.2  --   --   --     Liver Function Tests: Recent Labs  Lab 05/22/24 2124 05/24/24 0402  AST  --  30  ALT  --  <5  ALKPHOS  --  41  BILITOT  --  1.0  PROT  --  5.5*  ALBUMIN 3.1* 2.9*   No results for input(s): LIPASE, AMYLASE in the last 168 hours. No results for input(s): AMMONIA in the last 168 hours.  ABG    Component Value Date/Time   PHART 7.358 05/22/2024 1713   PCO2ART 35.9 05/22/2024 1713   PO2ART 90 05/22/2024 1713   HCO3 20.2 05/22/2024 1713   TCO2 21 (L) 05/22/2024 1713   ACIDBASEDEF 5.0 (H) 05/22/2024 1713   O2SAT 97 05/22/2024 1713     Coagulation Profile: Recent Labs  Lab 05/20/24 0353 05/21/24 1205  INR 1.0 1.7*    Cardiac Enzymes: No results for input(s): CKTOTAL, CKMB, CKMBINDEX, TROPONINI in the last 168 hours.  HbA1C: Hgb A1c MFr Bld  Date/Time Value Ref Range Status  05/17/2024 05:08 PM 6.1 (H) 4.8 - 5.6 % Final    Comment:    (NOTE) Diagnosis of Diabetes The following HbA1c ranges recommended by the American Diabetes Association (ADA) may be used as an aid in the diagnosis of diabetes mellitus.  Hemoglobin             Suggested A1C NGSP%              Diagnosis  <5.7                   Non Diabetic  5.7-6.4                Pre-Diabetic  >6.4                   Diabetic  <7.0                   Glycemic control for                       adults with diabetes.       CBG: Recent Labs  Lab 05/23/24 0842 05/23/24 1119 05/23/24 1619 05/23/24 2138 05/24/24 0614  GLUCAP 218* 152* 89 142* 111*    Mod MDM  Ronnald Gave MSN, AGACNP-BC [facility] Pulmonary/Critical Care Medicine Amion for pager  05/24/2024, 8:05 AM

## 2024-05-24 NOTE — Plan of Care (Signed)
 Progressing Add All Education: Understanding of CV disease, CV risk reduction, and recovery process will improve Add Today at 2331 - Progressing by Rolfe Corean HERO, RN Add Individualized Educational Video(s) Add Today at 2331 - Progressing by Rolfe Corean HERO, RN Add Activity: Ability to return to baseline activity level will improve Add Today at 2331 - Progressing by Rolfe Corean HERO, RN Add Cardiovascular: Ability to achieve and maintain adequate cardiovascular perfusion will improve Add Today at 2331 - Progressing by Rolfe Corean HERO, RN Add Health Behavior/Discharge Planning: Ability to safely manage health-related needs after discharge will improve Add Today at 2331 - Progressing by Rolfe Corean HERO, RN Add Education: Knowledge of General Education information will improve Add Today at 2331 - Progressing by Rolfe Corean HERO, RN Add Health Behavior/Discharge Planning: Ability to manage health-related needs will improve Add Today at 2331 - Progressing by Rolfe Corean HERO, RN Add Clinical Measurements: Ability to maintain clinical measurements within normal limits will improve Add Today at 2331 - Progressing by Rolfe Corean HERO, RN Add Will remain free from infection Add Today at 2331 - Progressing by Rolfe Corean HERO, RN Add Diagnostic test results will improve Add Today at 2331 - Progressing by Rolfe Corean HERO, RN Add Respiratory complications will improve Add Today at 2331 - Progressing by Rolfe Corean HERO, RN Add Cardiovascular complication will be avoided Add Today at 2331 - Progressing by Rolfe Corean HERO, RN Add Activity: Risk for activity intolerance will decrease Add Today at 2331 - Progressing by Rolfe Corean HERO, RN Add Nutrition: Adequate nutrition will be maintained Add Today at 2331 - Progressing by Rolfe Corean HERO, RN Add Elimination: Will not  experience complications related to bowel motility Add Today at 2331 - Progressing by Rolfe Corean HERO, RN Add Will not experience complications related to urinary retention Add Today at 2331 - Progressing by Rolfe Corean HERO, RN Add Pain Managment: General experience of comfort will improve and/or be controlled Add Today at 2331 - Progressing by Rolfe Corean HERO, RN Add Safety: Ability to remain free from injury will improve Add Today at 2331 - Progressing by Rolfe Corean HERO, RN Add Skin Integrity: Risk for impaired skin integrity will decrease Add Today at 2331 - Progressing by Rolfe Corean HERO, RN Add Education: Will demonstrate proper wound care and an understanding of methods to prevent future damage Add Today at 2331 - Progressing by Rolfe Corean HERO, RN Add Knowledge of disease or condition will improve Add Today at 2331 - Progressing by Rolfe Corean HERO, RN Add Knowledge of the prescribed therapeutic regimen will improve Add Today at 2331 - Progressing by Rolfe Corean HERO, RN Add Individualized Educational Video(s) Add Today at 2331 - Progressing by Rolfe Corean HERO, RN Add Activity: Risk for activity intolerance will decrease Add Today at 2331 - Progressing by Rolfe Corean HERO, RN Add Cardiac: Will achieve and/or maintain hemodynamic stability Add Today at 2331 - Progressing by Rolfe Corean HERO, RN Add Clinical Measurements: Postoperative complications will be avoided or minimized Add Today at 2331 - Progressing by Rolfe Corean HERO, RN Add Respiratory: Respiratory status will improve Add Today at 2331 - Progressing by Rolfe Corean HERO, RN Add Skin Integrity: Wound healing without signs and symptoms of infection Add Today at 2331 - Progressing by Rolfe Corean HERO, RN Add Risk for impaired skin integrity will decrease Add Today at 2331 - Progressing by  Rolfe Corean HERO, RN Add Urinary Elimination: Ability to achieve and maintain adequate renal perfusion and  functioning will improve Add Today at 2331 - Progressing by Rolfe Corean HERO, RN Add Education: Ability to describe self-care measures that may prevent or decrease complications (Diabetes Survival Skills Education) will improve Add Today at 2331 - Progressing by Rolfe Corean HERO, RN Add Individualized Educational Video(s) Add Today at 2331 - Progressing by Rolfe Corean HERO, RN Add Coping: Ability to adjust to condition or change in health will improve Add Today at 2331 - Progressing by Rolfe Corean HERO, RN Add Fluid Volume: Ability to maintain a balanced intake and output will improve Add Today at 2331 - Progressing by Rolfe Corean HERO, RN Add Health Behavior/Discharge Planning: Ability to identify and utilize available resources and services will improve Add Today at 2331 - Progressing by Rolfe Corean HERO, RN Add Ability to manage health-related needs will improve Add Today at 2331 - Progressing by Rolfe Corean HERO, RN Add Metabolic: Ability to maintain appropriate glucose levels will improve Add Today at 2331 - Progressing by Rolfe Corean HERO, RN Add Nutritional: Maintenance of adequate nutrition will improve Add Today at 2331 - Progressing by Rolfe Corean HERO, RN Add Progress toward achieving an optimal weight will improve Add Today at 2331 - Progressing by Rolfe Corean HERO, RN Add Skin Integrity: Risk for impaired skin integrity will decrease Add Today at 2331 - Progressing by Rolfe Corean HERO, RN Add Tissue Perfusion: Adequacy of tissue perfusion will improve Add Today at 2331 - Progressing by Rolfe Corean HERO, RN Add

## 2024-05-24 NOTE — Plan of Care (Signed)
  Problem: Metabolic: Goal: Ability to maintain appropriate glucose levels will improve Outcome: Progressing   Problem: Skin Integrity: Goal: Risk for impaired skin integrity will decrease Outcome: Progressing   Problem: Tissue Perfusion: Goal: Adequacy of tissue perfusion will improve Outcome: Progressing

## 2024-05-25 LAB — MAGNESIUM: Magnesium: 1.8 mg/dL (ref 1.7–2.4)

## 2024-05-25 LAB — GLUCOSE, CAPILLARY
Glucose-Capillary: 132 mg/dL — ABNORMAL HIGH (ref 70–99)
Glucose-Capillary: 172 mg/dL — ABNORMAL HIGH (ref 70–99)
Glucose-Capillary: 162 mg/dL — ABNORMAL HIGH (ref 70–99)
Glucose-Capillary: 124 mg/dL — ABNORMAL HIGH (ref 70–99)

## 2024-05-25 LAB — BASIC METABOLIC PANEL WITH GFR
Anion gap: 12 (ref 5–15)
BUN: 26 mg/dL — ABNORMAL HIGH (ref 8–23)
CO2: 27 mmol/L (ref 22–32)
Calcium: 8.3 mg/dL — ABNORMAL LOW (ref 8.9–10.3)
Chloride: 93 mmol/L — ABNORMAL LOW (ref 98–111)
Creatinine, Ser: 1.4 mg/dL — ABNORMAL HIGH (ref 0.44–1.00)
GFR, Estimated: 39 mL/min — ABNORMAL LOW (ref >60)
Glucose, Bld: 137 mg/dL — ABNORMAL HIGH (ref 70–99)
Potassium: 3.6 mmol/L (ref 3.5–5.1)
Sodium: 132 mmol/L — ABNORMAL LOW (ref 135–145)

## 2024-05-25 MED ORDER — SODIUM CHLORIDE 0.9 % IV SOLN: 250.0000 mL | INTRAVENOUS | Status: AC | PRN

## 2024-05-25 MED ORDER — SODIUM CHLORIDE 0.9% FLUSH
3.0000 mL | Freq: Two times a day (BID) | INTRAVENOUS | Status: DC
  Administered 2024-05-25 – 2024-05-28 (×7): 3 mL via INTRAVENOUS

## 2024-05-25 MED ORDER — POTASSIUM CHLORIDE CRYS ER 20 MEQ PO TBCR: 20.0000 meq | EXTENDED_RELEASE_TABLET | Freq: Two times a day (BID) | ORAL | Status: DC

## 2024-05-25 MED ORDER — ASPIRIN 325 MG PO TBEC
325.0000 mg | DELAYED_RELEASE_TABLET | Freq: Every day | ORAL | Status: DC
  Administered 2024-05-26 – 2024-05-28 (×3): 325 mg via ORAL
  Filled 2024-05-25 (×4): qty 1

## 2024-05-25 MED ORDER — SODIUM CHLORIDE 0.9% FLUSH: 3.0000 mL | INTRAVENOUS | Status: DC | PRN

## 2024-05-25 MED ORDER — POTASSIUM CHLORIDE CRYS ER 20 MEQ PO TBCR
20.0000 meq | EXTENDED_RELEASE_TABLET | Freq: Three times a day (TID) | ORAL | Status: AC
  Administered 2024-05-25 – 2024-05-26 (×3): 20 meq via ORAL
  Filled 2024-05-25 (×3): qty 1

## 2024-05-25 MED ORDER — POTASSIUM CHLORIDE CRYS ER 20 MEQ PO TBCR
20.0000 meq | EXTENDED_RELEASE_TABLET | Freq: Two times a day (BID) | ORAL | Status: DC
  Administered 2024-05-25: 20 meq via ORAL
  Filled 2024-05-25: qty 1

## 2024-05-25 MED ORDER — FUROSEMIDE 40 MG PO TABS
40.0000 mg | ORAL_TABLET | Freq: Every day | ORAL | Status: DC
  Administered 2024-05-25 – 2024-05-26 (×2): 40 mg via ORAL
  Filled 2024-05-25 (×2): qty 1

## 2024-05-25 MED ORDER — ~~LOC~~ CARDIAC SURGERY, PATIENT & FAMILY EDUCATION: Freq: Once | Status: AC

## 2024-05-25 MED ORDER — POTASSIUM CHLORIDE CRYS ER 20 MEQ PO TBCR
20.0000 meq | EXTENDED_RELEASE_TABLET | Freq: Two times a day (BID) | ORAL | Status: DC
  Administered 2024-05-26: 20 meq via ORAL
  Filled 2024-05-25: qty 1

## 2024-05-25 MED ORDER — DOCUSATE SODIUM 100 MG PO CAPS
200.0000 mg | ORAL_CAPSULE | Freq: Every day | ORAL | Status: DC
  Filled 2024-05-25 (×2): qty 2

## 2024-05-25 MED ORDER — LACTULOSE 10 GM/15ML PO SOLN: 20.0000 g | Freq: Every day | ORAL | Status: DC | PRN

## 2024-05-25 NOTE — Plan of Care (Signed)
 Patient is transferred out of ICU CTS team ICU will sign off.  Tamela Stakes, MD  Attending Physician, Critical Care Medicine [facility] Pulmonary Critical Care See Amion for pager If no response to pager, please call [phone] until 7pm After 7pm, Please call E-link [phone]

## 2024-05-25 NOTE — Progress Notes (Addendum)
Patient arrived from  [facility] VSS alert and oriented x 4 CCMD notifed, CHG bath completed, Bed in lowest position with call light in reach current plan of care in progress

## 2024-05-25 NOTE — Progress Notes (Signed)
 4 Days Post-Op Procedure(s) (LRB): OFF PUMP CORONARY ARTERY BYPASS GRAFTING X 2, USING LEFT INTERNAL MAMMARY ARTERY AND ENDOSCOPIC HARVESTED LEFT GREATER SAPHENOUS VEIN (N/A) Subjective: No complaints. Up in chair.  Objective: Vital signs in last 24 hours: Temp:  [98.4 F (36.9 C)-98.8 F (37.1 C)] 98.8 F (37.1 C) (11/01 9361) Pulse Rate:  [74-121] 108 (11/01 0800) Cardiac Rhythm: Normal sinus rhythm;Sinus tachycardia (11/01 0400) Resp:  [11-23] 20 (11/01 0800) BP: (102-147)/(54-75) 106/65 (11/01 0800) SpO2:  [88 %-100 %] 96 % (11/01 0800) FiO2 (%):  [36 %] 36 % (11/01 0738)  Hemodynamic parameters for last 24 hours: CVP:  [15 mmHg] 15 mmHg  Intake/Output from previous day: 10/31 0701 - 11/01 0700 In: 720 [P.O.:720] Out: 3750 [Urine:3750] Intake/Output this shift: No intake/output data recorded.  General appearance: alert and cooperative Neurologic: intact Heart: regular rate and rhythm Lungs: clear to auscultation bilaterally Extremities: edema mild Wound: silver dressing in place.  Lab Results: Recent Labs    05/23/24 0412 05/24/24 0402  WBC 13.4* 9.7  HGB 8.3* 8.1*  HCT 25.3* 25.2*  PLT 270 274   BMET:  Recent Labs    05/24/24 0402 05/25/24 0458  NA 129* 132*  K 4.1 3.6  CL 95* 93*  CO2 25 27  GLUCOSE 105* 137*  BUN 26* 26*  CREATININE 1.51* 1.40*  CALCIUM  7.8* 8.3*    PT/INR: No results for input(s): LABPROT, INR in the last 72 hours. ABG    Component Value Date/Time   PHART 7.358 05/22/2024 1713   HCO3 20.2 05/22/2024 1713   TCO2 21 (L) 05/22/2024 1713   ACIDBASEDEF 5.0 (H) 05/22/2024 1713   O2SAT 97 05/22/2024 1713   CBG (last 3)  Recent Labs    05/24/24 1642 05/24/24 2108 05/25/24 0631  GLUCAP 105* 177* 132*    Assessment/Plan: S/P Procedure(s) (LRB): OFF PUMP CORONARY ARTERY BYPASS GRAFTING X 2, USING LEFT INTERNAL MAMMARY ARTERY AND ENDOSCOPIC HARVESTED LEFT GREATER SAPHENOUS VEIN (N/A)  POD 4 Hemodynamically stable in  NSR. Continue Lopressor.  -3L yesterday with diuresis. No wt yet today. She was 18 lbs over preop yesterday. Continue diuresis.  DM: glucose under good control. Continue current regimen.  She was off oxygen overnight but put back on this am due to sats in 80's when she rolled over. I question if that is accurate.  Will transfer to 4E.    LOS: 8 days    Dorise MARLA Fellers 05/25/2024

## 2024-05-26 ENCOUNTER — Inpatient Hospital Stay (HOSPITAL_COMMUNITY)

## 2024-05-26 DIAGNOSIS — R000 Tachycardia, unspecified: Secondary | ICD-10-CM

## 2024-05-26 LAB — GLUCOSE, CAPILLARY
Glucose-Capillary: 140 mg/dL — ABNORMAL HIGH (ref 70–99)
Glucose-Capillary: 144 mg/dL — ABNORMAL HIGH (ref 70–99)
Glucose-Capillary: 218 mg/dL — ABNORMAL HIGH (ref 70–99)
Glucose-Capillary: 133 mg/dL — ABNORMAL HIGH (ref 70–99)
Glucose-Capillary: 149 mg/dL — ABNORMAL HIGH (ref 70–99)

## 2024-05-26 MED ORDER — METOPROLOL TARTRATE 50 MG PO TABS
50.0000 mg | ORAL_TABLET | Freq: Two times a day (BID) | ORAL | Status: DC
  Administered 2024-05-26 – 2024-05-28 (×4): 50 mg via ORAL
  Filled 2024-05-26 (×4): qty 1

## 2024-05-26 MED ORDER — METFORMIN HCL 500 MG PO TABS
1000.0000 mg | ORAL_TABLET | Freq: Two times a day (BID) | ORAL | Status: DC
Start: 2024-05-26 — End: 2024-05-28
  Administered 2024-05-26 – 2024-05-28 (×4): 1000 mg via ORAL
  Filled 2024-05-26 (×4): qty 2

## 2024-05-26 MED ORDER — METOCLOPRAMIDE HCL 5 MG/ML IJ SOLN
10.0000 mg | Freq: Four times a day (QID) | INTRAMUSCULAR | Status: AC
  Administered 2024-05-26 – 2024-05-27 (×4): 10 mg via INTRAVENOUS
  Filled 2024-05-26 (×4): qty 2

## 2024-05-26 MED ORDER — DOCUSATE SODIUM 100 MG PO CAPS: 200.0000 mg | ORAL_CAPSULE | Freq: Two times a day (BID) | ORAL | Status: DC | PRN

## 2024-05-26 NOTE — Progress Notes (Addendum)
      [address] [place] [address]             [phone]         5 Days Post-Op Procedure(s) (LRB): OFF PUMP CORONARY ARTERY BYPASS GRAFTING X 2, USING LEFT INTERNAL MAMMARY ARTERY AND ENDOSCOPIC HARVESTED LEFT GREATER SAPHENOUS VEIN (N/A)  Subjective:  Patient with multiple complaints.  States she is sore all over, mostly in chest.  She is feeling hot/cold.  She is also nauseated.  She has moved her bowels.  Objective: Vital signs in last 24 hours: Temp:  [98 F (36.7 C)-99.3 F (37.4 C)] 99.3 F (37.4 C) (11/02 0738) Pulse Rate:  [90-119] 117 (11/02 0834) Cardiac Rhythm: Sinus tachycardia (11/01 1900) Resp:  [17-20] 20 (11/02 0751) BP: (105-143)/(62-85) 131/85 (11/02 0834) SpO2:  [93 %-100 %] 94 % (11/02 0751) Weight:  [66.4 kg] 66.4 kg (11/02 0500)  Intake/Output from previous day: 11/01 0701 - 11/02 0700 In: 440 [P.O.:440] Out: 225 [Urine:225]  General appearance: alert, cooperative, and no distress Heart: regular rate and rhythm and tachy Lungs: clear to auscultation bilaterally Abdomen: soft, non-tender; bowel sounds normal; no masses,  no organomegaly Extremities: edema none appreciated Wound: clean and dry  Lab Results: Recent Labs    05/24/24 0402  WBC 9.7  HGB 8.1*  HCT 25.2*  PLT 274   BMET:  Recent Labs    05/24/24 0402 05/25/24 0458  NA 129* 132*  K 4.1 3.6  CL 95* 93*  CO2 25 27  GLUCOSE 105* 137*  BUN 26* 26*  CREATININE 1.51* 1.40*  CALCIUM  7.8* 8.3*    PT/INR: No results for input(s): LABPROT, INR in the last 72 hours. ABG    Component Value Date/Time   PHART 7.358 05/22/2024 1713   HCO3 20.2 05/22/2024 1713   TCO2 21 (L) 05/22/2024 1713   ACIDBASEDEF 5.0 (H) 05/22/2024 1713   O2SAT 97 05/22/2024 1713   CBG (last 3)  Recent Labs    05/25/24 2120 05/26/24 0626 05/26/24 0738  GLUCAP 124* 140* 144*    Assessment/Plan: S/P Procedure(s) (LRB): OFF PUMP CORONARY ARTERY BYPASS GRAFTING X  2, USING LEFT INTERNAL MAMMARY ARTERY AND ENDOSCOPIC HARVESTED LEFT GREATER SAPHENOUS VEIN (N/A)  CV- Sinus Tach-rates in the 120s- will increase Lopressor to 50 mg BID Pulm- not requiring oxygen, continue IS.SABRA CXR w/o significant effusion, some improvement in atelectasis Renal- creatinine has been stable, weight was inaccurate at 160... she is at 19 which is just a few pounds above baseline.. will continue Lasix, potassium for now but I suspect we can stop prior to discharge GI- Nausea... she has moved her bowels.. taking some zofran  with relief.. will give some scheduled reglan to help DM- sugars are well controlled, will stop Levemir  and resume home Metformin     LOS: 9 days    Rocky Shad, PA-C 05/26/2024 9:26 AM   Chart reviewed, patient examined, agree with above.  She is progressing slowly. Ambulating and eating. CXR today shows small bilateral pleural effusions. Continue diuretic.

## 2024-05-27 LAB — GLUCOSE, CAPILLARY
Glucose-Capillary: 150 mg/dL — ABNORMAL HIGH (ref 70–99)
Glucose-Capillary: 169 mg/dL — ABNORMAL HIGH (ref 70–99)
Glucose-Capillary: 148 mg/dL — ABNORMAL HIGH (ref 70–99)
Glucose-Capillary: 139 mg/dL — ABNORMAL HIGH (ref 70–99)

## 2024-05-27 MED ORDER — AMLODIPINE BESYLATE 5 MG PO TABS
5.0000 mg | ORAL_TABLET | Freq: Every day | ORAL | Status: DC
  Administered 2024-05-27: 5 mg via ORAL
  Filled 2024-05-27: qty 1

## 2024-05-27 NOTE — Progress Notes (Signed)
 CARDIAC REHAB PHASE I   PRE:  Rate/Rhythm: 84 SR    BP: sitting 121/63    SpO2: 97 RA  MODE:  Ambulation: 260 ft   POST:  Rate/Rhythm: 111 ST    BP: sitting 146/82     SpO2: 95 RA   Pt moved out of bed following sternal precautions, stood independently. Able to walk with RW, steady, standby assist. To recliner after walk. SpO2 pleth would not register walking however had pt stop to check x2, holding finger out, and SpO2 95-97 RA.   Discussed with pt IS, sternal precautions, exercise, diet, and CRPII. Pt receptive. Will refer to AP CRPII.  [id number]  Natalie Anderson BS, ACSM-CEP 05/27/2024 3:11 PM

## 2024-05-27 NOTE — Evaluation (Signed)
 Physical Therapy Evaluation Patient Details Name: Natalie Anderson MRN: [id number] DOB: [birth year] Today's Date: 05/27/2024  History of Present Illness  78 yo F presents to [facility] on 10/24 due to ongoing sob and exertional CP over the past 4-5 weeks. Echo lvef 70% w/ grade 1 diastolic dysfunction, TCTS consulted and recommended CABG, 10/28 patient taken to [place] CABGx2, PMH DMT2, HTN, HLD  Clinical Impression  Pt presents to PT with deficits in cardiopulmonary function, gait, balance, endurance. Pt currently requires 3L of supplemental oxygen when ambulating and 2L when resting to maintain sats at or above 92%. Pt is able to verbalize sternal precautions but will benefit from further cueing when performing ADLs. Pt is encouraged to mobilize frequently in an effort to improve activity tolerance and to restore independence. No post-acute PT needs are anticipated at this time.        If plan is discharge home, recommend the following: A little help with bathing/dressing/bathroom;Assistance with cooking/housework;Assist for transportation;Help with stairs or ramp for entrance   Can travel by private vehicle        Equipment Recommendations Rolling walker (2 wheels);BSC/3in1 (may progress to not needing RW as pt owns a rollator)  Recommendations for Other Services       Functional Status Assessment Patient has had a recent decline in their functional status and demonstrates the ability to make significant improvements in function in a reasonable and predictable amount of time.     Precautions / Restrictions Precautions Precautions: Fall;Sternal Precaution Booklet Issued: Yes (comment) Recall of Precautions/Restrictions: Intact Restrictions Weight Bearing Restrictions Per Provider Order: No Other Position/Activity Restrictions: sternal precautions      Mobility  Bed Mobility Overal bed mobility: Modified Independent             General bed mobility comments: sidelying to sitting,  increased time    Transfers Overall transfer level: Modified independent Equipment used: Rolling walker (2 wheels) Transfers: Sit to/from Stand Sit to Stand: Modified independent (Device/Increase time)                Ambulation/Gait Ambulation/Gait assistance: Supervision Gait Distance (Feet): 150 Feet Assistive device: Rolling walker (2 wheels) Gait Pattern/deviations: Step-through pattern Gait velocity: functional Gait velocity interpretation: 1.31 - 2.62 ft/sec, indicative of limited community ambulator   General Gait Details: slowed step-through gait  Stairs            Wheelchair Mobility     Tilt Bed    Modified Rankin (Stroke Patients Only)       Balance Overall balance assessment: Needs assistance Sitting-balance support: No upper extremity supported, Feet supported Sitting balance-Leahy Scale: Good     Standing balance support: Single extremity supported, Reliant on assistive device for balance Standing balance-Leahy Scale: Poor                               Pertinent Vitals/Pain Pain Assessment Pain Assessment: Faces Faces Pain Scale: Hurts even more Pain Location: chest Pain Descriptors / Indicators: Sore Pain Intervention(s): Monitored during session    Home Living Family/patient expects to be discharged to:: Private residence Living Arrangements: Alone Available Help at Discharge: Family;Available 24 hours/day Type of Home: House Home Access: Level entry       Home Layout: One level Home Equipment: Rollator (4 wheels) Additional Comments: Pt lives alone but will have family available 24/7    Prior Function Prior Level of Function : Independent/Modified Independent  Mobility Comments: ind ADLs Comments: ind     Extremity/Trunk Assessment   Upper Extremity Assessment Upper Extremity Assessment: Defer to OT evaluation    Lower Extremity Assessment Lower Extremity Assessment: Overall WFL for  tasks assessed    Cervical / Trunk Assessment Cervical / Trunk Assessment: Normal  Communication   Communication Communication: Impaired Factors Affecting Communication: Hearing impaired    Cognition Arousal: Alert Behavior During Therapy: WFL for tasks assessed/performed   PT - Cognitive impairments: No apparent impairments                       PT - Cognition Comments: pt is able to verbalize sternal precautions, will benefit from further cueing for implementation when managing ADLs Following commands: Intact       Cueing Cueing Techniques: Verbal cues     General Comments General comments (skin integrity, edema, etc.): pt on RA upon PT arrival, sats at 87%. Pt requires 2L [place] at rest to maintain sats at or above 92%. When ambulating pt requires 3L to maintain sats at or above 92%    Exercises     Assessment/Plan    PT Assessment Patient needs continued PT services  PT Problem List Decreased activity tolerance;Cardiopulmonary status limiting activity;Decreased knowledge of precautions;Decreased safety awareness;Decreased knowledge of use of DME       PT Treatment Interventions DME instruction;Gait training;Stair training;Functional mobility training;Therapeutic exercise;Therapeutic activities;Balance training;Neuromuscular re-education;Patient/family education    PT Goals (Current goals can be found in the Care Plan section)  Acute Rehab PT Goals Patient Stated Goal: to return to independence PT Goal Formulation: With patient Time For Goal Achievement: 06/10/24 Potential to Achieve Goals: Good    Frequency Min 2X/week     Co-evaluation               AM-PAC PT 6 Clicks Mobility  Outcome Measure Help needed turning from your back to your side while in a flat bed without using bedrails?: None Help needed moving from lying on your back to sitting on the side of a flat bed without using bedrails?: None Help needed moving to and from a bed to a  chair (including a wheelchair)?: None Help needed standing up from a chair using your arms (e.g., wheelchair or bedside chair)?: None Help needed to walk in hospital room?: A Little Help needed climbing 3-5 steps with a railing? : A Little 6 Click Score: 22    End of Session Equipment Utilized During Treatment: Oxygen Activity Tolerance: Patient tolerated treatment well Patient left: in chair;with call bell/phone within reach Nurse Communication: Mobility status PT Visit Diagnosis: Other abnormalities of gait and mobility (R26.89)    Time: 9089-9068 PT Time Calculation (min) (ACUTE ONLY): 21 min   Charges:   PT Evaluation $PT Eval Low Complexity: 1 Low   PT General Charges $$ ACUTE PT VISIT: 1 Visit         Bernardino JINNY Ruth, PT, DPT Acute Rehabilitation Office [phone]   Bernardino JINNY Ruth 05/27/2024, 9:36 AM

## 2024-05-27 NOTE — Evaluation (Signed)
 Occupational Therapy Evaluation Patient Details Name: Natalie Anderson MRN: [id number] DOB: 12/26/1945 Today's Date: 05/27/2024   History of Present Illness   78 yo F presents to [facility] on 10/24 due to ongoing sob and exertional CP over the past 4-5 weeks. Echo lvef 70% w/ grade 1 diastolic dysfunction, TCTS consulted and recommended CABG, 10/28 patient taken to OR CABGx2, PMH DMT2, HTN, HLD     Clinical Impressions Pt c/o 9/10 pain to chest, able to fully participate, not limited in activities due to pain. Pt lives alone, but will have a family member with her 24/7 upon DC as needed. PLOF independent. Pt currently limited due to sternal precautions and pain in chest, will need min A for dressing/bathing, assist with IADLs. Pt O2 saturation decreased to 87% after ambulating 30 feet with RW, supervision for safety. Pt required several minutes to improve to 94% O2, HR 118-130 during session, did not drop below 118 even at rest. Pt doing well overall, verbalized understanding of precautions. Pt would benefit from continued acute OT to maximize activity tolerance and improve with functional independence, would benefit from RW, tub bench, and BSC for return home as Pt has a large house and may have trouble ambulating frequently across home to bathroom. Follow physicians recommendations for follow up therapies.      If plan is discharge home, recommend the following:   A little help with walking and/or transfers;A little help with bathing/dressing/bathroom;Assistance with cooking/housework;Assist for transportation     Functional Status Assessment   Patient has had a recent decline in their functional status and demonstrates the ability to make significant improvements in function in a reasonable and predictable amount of time.     Equipment Recommendations   BSC/3in1;Tub/shower bench;Other (comment) (RW)     Recommendations for Other Services         Precautions/Restrictions    Precautions Precautions: Fall;Sternal     Mobility Bed Mobility Overal bed mobility: Modified Independent                  Transfers Overall transfer level: Needs assistance Equipment used: Rolling walker (2 wheels) Transfers: Sit to/from Stand, Bed to chair/wheelchair/BSC Sit to Stand: Supervision     Step pivot transfers: Supervision     General transfer comment: supervision for safety with RW, cueing for precautions, O2 drops on RA with activity to 88%, HR 130, several minutes to improve.      Balance Overall balance assessment: Needs assistance Sitting-balance support: No upper extremity supported, Feet supported Sitting balance-Leahy Scale: Good     Standing balance support: No upper extremity supported, During functional activity Standing balance-Leahy Scale: Fair Standing balance comment: able to stand unassisted, RW for dynamic balance                           ADL either performed or assessed with clinical judgement   ADL Overall ADL's : Needs assistance/impaired Eating/Feeding: Independent   Grooming: Supervision/safety;Sitting;Standing   Upper Body Bathing: Minimal assistance;Sitting   Lower Body Bathing: Moderate assistance;Sit to/from stand   Upper Body Dressing : Minimal assistance;Sitting   Lower Body Dressing: Moderate assistance;Sit to/from stand   Toilet Transfer: Supervision/safety;Rolling walker (2 wheels)   Toileting- Clothing Manipulation and Hygiene: Minimal assistance;Sitting/lateral lean;Sit to/from stand       Functional mobility during ADLs: Supervision/safety;Rolling walker (2 wheels) General ADL Comments: Pt doing well overall, limited due to sternal precautions, pain in chest, fair balance and use of  UEs.     Vision         Perception         Praxis         Pertinent Vitals/Pain Pain Assessment Pain Assessment: 0-10 Pain Score: 8  Pain Location: chest, surgical incision Pain Descriptors /  Indicators: Aching, Discomfort, Grimacing Pain Intervention(s): Monitored during session     Extremity/Trunk Assessment Upper Extremity Assessment Upper Extremity Assessment: Overall WFL for tasks assessed   Lower Extremity Assessment Lower Extremity Assessment: Defer to PT evaluation       Communication Communication Communication: Impaired Factors Affecting Communication: Hearing impaired   Cognition Arousal: Alert Behavior During Therapy: WFL for tasks assessed/performed Cognition: No apparent impairments                               Following commands: Intact       Cueing  General Comments   Cueing Techniques: Verbal cues      Exercises     Shoulder Instructions      Home Living Family/patient expects to be discharged to:: Private residence Living Arrangements: Alone Available Help at Discharge: Family;Available 24 hours/day Type of Home: House Home Access: Level entry     Home Layout: One level     Bathroom Shower/Tub: Tub/shower unit         Home Equipment: Rollator (4 wheels)   Additional Comments: Pt lives alone but will have family available 24/7, needs RW, tub bench, and BSC, feels like her rollator will not be sufficient      Prior Functioning/Environment Prior Level of Function : Independent/Modified Independent             Mobility Comments: ind ADLs Comments: ind    OT Problem List: Decreased strength;Decreased range of motion;Decreased activity tolerance;Impaired balance (sitting and/or standing);Impaired UE functional use;Pain   OT Treatment/Interventions: Self-care/ADL training;Therapeutic exercise;Energy conservation;DME and/or AE instruction;Therapeutic activities;Patient/family education      OT Goals(Current goals can be found in the care plan section)   Acute Rehab OT Goals Patient Stated Goal: to return home OT Goal Formulation: With patient Time For Goal Achievement: 06/10/24 Potential to Achieve  Goals: Good   OT Frequency:  Min 2X/week    Co-evaluation              AM-PAC OT 6 Clicks Daily Activity     Outcome Measure Help from another person eating meals?: None Help from another person taking care of personal grooming?: A Little Help from another person toileting, which includes using toliet, bedpan, or urinal?: A Little Help from another person bathing (including washing, rinsing, drying)?: A Little Help from another person to put on and taking off regular upper body clothing?: A Little Help from another person to put on and taking off regular lower body clothing?: A Lot 6 Click Score: 18   End of Session Equipment Utilized During Treatment: Gait belt;Rolling walker (2 wheels) Nurse Communication: Mobility status  Activity Tolerance: Patient tolerated treatment well Patient left: in bed;with call bell/phone within reach  OT Visit Diagnosis: Unsteadiness on feet (R26.81);Muscle weakness (generalized) (M62.81);Pain Pain - part of body:  (chest)                Time: 9174-9148 OT Time Calculation (min): 26 min Charges:  OT General Charges $OT Visit: 1 Visit OT Evaluation $OT Eval Low Complexity: 1 Low OT Treatments $Self Care/Home Management : 8-22 mins  Carmisha Larusso, OTR/L  [place]  JONELLE Bott 05/27/2024, 8:58 AM

## 2024-05-27 NOTE — Progress Notes (Addendum)
 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 2, USING LEFT INTERNAL MAMMARY ARTERY AND ENDOSCOPIC HARVESTED LEFT GREATER SAPHENOUS VEIN (N/A) Subjective: Fair amt of nausea yesterday , small BM, feels somewhat better  Objective: Vital signs in last 24 hours: Temp:  [97.9 F (36.6 C)-99.3 F (37.4 C)] 98.3 F (36.8 C) (11/03 0541) Pulse Rate:  [87-119] 105 (11/03 0541) Cardiac Rhythm: Sinus tachycardia (11/02 1900) Resp:  [16-23] 20 (11/03 0559) BP: (118-152)/(58-85) 126/69 (11/03 0541) SpO2:  [86 %-97 %] 90 % (11/03 0541) FiO2 (%):  [21 %] 21 % (11/02 1957) Weight:  [63.7 kg] 63.7 kg (11/03 0500)  Hemodynamic parameters for last 24 hours:    Intake/Output from previous day: 11/02 0701 - 11/03 0700 In: 480 [P.O.:480] Out: 350 [Urine:350] Intake/Output this shift: No intake/output data recorded.  General appearance: alert, cooperative, and no distress Heart: regular rate and rhythm Lungs: clear to auscultation bilaterally Abdomen: soft, non tender, + BS Extremities: no edema Wound: incis healing well  Lab Results: No results for input(s): WBC, HGB, HCT, PLT in the last 72 hours. BMET:  Recent Labs    05/25/24 0458  NA 132*  K 3.6  CL 93*  CO2 27  GLUCOSE 137*  BUN 26*  CREATININE 1.40*  CALCIUM  8.3*    PT/INR: No results for input(s): LABPROT, INR in the last 72 hours. ABG    Component Value Date/Time   PHART 7.358 05/22/2024 1713   HCO3 20.2 05/22/2024 1713   TCO2 21 (L) 05/22/2024 1713   ACIDBASEDEF 5.0 (H) 05/22/2024 1713   O2SAT 97 05/22/2024 1713   CBG (last 3)  Recent Labs    05/26/24 1259 05/26/24 1722 05/26/24 2138  GLUCAP 218* 133* 149*    Meds Scheduled Meds:  arformoterol  15 mcg Nebulization BID   aspirin EC  325 mg Oral Daily   enoxaparin (LOVENOX) injection  30 mg Subcutaneous Daily   feeding supplement  237 mL Oral BID BM   furosemide  40 mg Oral Daily   gabapentin   300 mg Oral BID    insulin  aspart  0-15 Units Subcutaneous TID WC   insulin  aspart  0-5 Units Subcutaneous QHS   metFORMIN   1,000 mg Oral BID WC   metoprolol tartrate  50 mg Oral BID   pantoprazole   40 mg Oral Daily   potassium chloride  20 mEq Oral BID   revefenacin  175 mcg Nebulization Daily   rosuvastatin  5 mg Oral Weekly   sodium chloride  flush  3 mL Intravenous Q12H   Continuous Infusions: PRN Meds:.docusate sodium, ipratropium-albuterol, lactulose, ondansetron  (ZOFRAN ) IV, mouth rinse, oxyCODONE , sodium chloride  flush, traMADol  Xrays DG Chest 2 View Result Date: 05/26/2024 CLINICAL DATA:  Postop from CABG. EXAM: CHEST - 2 VIEW COMPARISON:  05/22/2024 FINDINGS: Prior CABG. Heart size is normal. Right jugular central venous catheter is been removed. No pneumothorax visualized. Improved aeration of both lungs is seen, with decreased left lower lobe atelectasis. Mild atelectasis is seen in the right perihilar region. Tiny bilateral pleural effusions also noted. IMPRESSION: Improved aeration of both lungs, with decreased left lower lobe atelectasis. Mild right perihilar atelectasis and tiny bilateral pleural effusions. Electronically Signed   By: Norleen DELENA Kil M.D.   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 2, USING LEFT INTERNAL MAMMARY ARTERY AND ENDOSCOPIC HARVESTED LEFT GREATER SAPHENOUS VEIN (N/A) POD#6  1 afeb,Tmax 99.3,  S BP 110's-150's,mostly controlled but  with some higher readings  will resume home norvasc, sinus rhythm/ sinus tachy- metoprolol was increased yesterday 2 O2 sats borderline on RA( some readings in 80's)- has sign COPD- cont nebs 3 voiding- unmeasured, weight below preop- will stop lasix as may be getting dry 4 BS control adeq - now on metformin (home), on SSI- follow , was also on Mounjaro and tresiba at home, may need to introduce further meds as gets closer to discharge 5 no new labs- will repeat in am  6  cont pulm hygiene and rehab 7 hopefully home in am w/ family- will get PT/OT to make home recs    LOS: 10 days    Lemond FORBES Cera PA-C Pager [phone] 05/27/2024   Agree Dispo planning  Kallan Merrick MALVA Rayas

## 2024-05-27 NOTE — Plan of Care (Signed)
  Problem: Activity: Goal: Ability to return to baseline activity level will improve Outcome: Progressing   Problem: Cardiovascular: Goal: Ability to achieve and maintain adequate cardiovascular perfusion will improve Outcome: Progressing   Problem: Health Behavior/Discharge Planning: Goal: Ability to safely manage health-related needs after discharge will improve Outcome: Progressing   Problem: Education: Goal: Knowledge of General Education information will improve Description: Including pain rating scale, medication(s)/side effects and non-pharmacologic comfort measures Outcome: Progressing   Problem: Health Behavior/Discharge Planning: Goal: Ability to manage health-related needs will improve Outcome: Progressing   Problem: Clinical Measurements: Goal: Will remain free from infection Outcome: Progressing

## 2024-05-27 NOTE — TOC Progression Note (Signed)
 Transition of Care (TOC) - Progression Note  Rayfield Gobble RN, BSN Inpatient Care Management Unit 4E- RN Case Manager See Treatment Team for direct phone #   Patient Details  Name: Natalie Anderson MRN: [id number] Date of Birth: 06-29-1946  Transition of Care [facility]) CM/SW Contact  Gobble Rayfield Hurst, RN Phone Number: 05/27/2024, 3:06 PM  Clinical Narrative:    CM spoke with pt at bedside to follow up on transition needs. CM notified by [facility] liaison that TCTS office made pre-op referral for [facility] needs, liaison following.   Per discussion with pt choice offered for [facility] and DME needs and discussed referral to [facility] by TCTS office- pt voiced that she thought HH would be a good idea and agreeable to use [facility] stating she had no other preference.   Pt states her sister will assist her and transport home.   Reviewed DME- pt has a rollator at home but feels RW might be better for now and also a BSC as her bathroom is several rooms down from her bedroom. As insurance does not cover tub benches- pt states she will follow up to purchase that on her own.  Will need DME orders for RW and BSC  CM will follow up for DME referral once orders have been placed.    Expected Discharge Plan: Home w Home Health Services Barriers to Discharge: Continued Medical Work up               Expected Discharge Plan and Services   Discharge Planning Services: CM Consult Post Acute Care Choice: Durable Medical Equipment, Home Health Living arrangements for the past 2 months: Single Family Home                 DME Arranged: Walker rolling, Bedside commode DME Agency: NA       HH Arranged: RN, PT, OT HH Agency: [facility] ([facility] Date HH Agency Contacted: 05/27/24 Time HH Agency Contacted: [id number] Representative spoke with at [facility] Agency: [place]   Social Drivers of Health (SDOH) Interventions SDOH Screenings   Food Insecurity: No Food Insecurity (05/18/2024)  Housing: Low  Risk  (05/18/2024)  Transportation Needs: No Transportation Needs (05/18/2024)  Utilities: Not At Risk (05/18/2024)  Tobacco Use: Low Risk  (05/21/2024)    Readmission Risk Interventions     No data to display

## 2024-05-28 DIAGNOSIS — M4800 Spinal stenosis, site unspecified: Secondary | ICD-10-CM | POA: Diagnosis not present

## 2024-05-28 DIAGNOSIS — I25110 Atherosclerotic heart disease of native coronary artery with unstable angina pectoris: Secondary | ICD-10-CM | POA: Diagnosis not present

## 2024-05-28 DIAGNOSIS — Z5982 Transportation insecurity: Secondary | ICD-10-CM | POA: Diagnosis not present

## 2024-05-28 DIAGNOSIS — Z951 Presence of aortocoronary bypass graft: Secondary | ICD-10-CM | POA: Diagnosis not present

## 2024-05-28 DIAGNOSIS — F5083 Pica in adults: Secondary | ICD-10-CM | POA: Diagnosis not present

## 2024-05-28 DIAGNOSIS — Z7984 Long term (current) use of oral hypoglycemic drugs: Secondary | ICD-10-CM | POA: Diagnosis not present

## 2024-05-28 DIAGNOSIS — I10 Essential (primary) hypertension: Secondary | ICD-10-CM | POA: Diagnosis not present

## 2024-05-28 DIAGNOSIS — Z794 Long term (current) use of insulin: Secondary | ICD-10-CM | POA: Diagnosis not present

## 2024-05-28 DIAGNOSIS — M479 Spondylosis, unspecified: Secondary | ICD-10-CM | POA: Diagnosis not present

## 2024-05-28 DIAGNOSIS — Z48812 Encounter for surgical aftercare following surgery on the circulatory system: Secondary | ICD-10-CM | POA: Diagnosis not present

## 2024-05-28 DIAGNOSIS — E119 Type 2 diabetes mellitus without complications: Secondary | ICD-10-CM | POA: Diagnosis not present

## 2024-05-28 DIAGNOSIS — M810 Age-related osteoporosis without current pathological fracture: Secondary | ICD-10-CM | POA: Diagnosis not present

## 2024-05-28 DIAGNOSIS — J4489 Other specified chronic obstructive pulmonary disease: Secondary | ICD-10-CM | POA: Diagnosis not present

## 2024-05-28 DIAGNOSIS — K219 Gastro-esophageal reflux disease without esophagitis: Secondary | ICD-10-CM | POA: Diagnosis not present

## 2024-05-28 DIAGNOSIS — Z79891 Long term (current) use of opiate analgesic: Secondary | ICD-10-CM | POA: Diagnosis not present

## 2024-05-28 DIAGNOSIS — Z79899 Other long term (current) drug therapy: Secondary | ICD-10-CM | POA: Diagnosis not present

## 2024-05-28 DIAGNOSIS — Z87891 Personal history of nicotine dependence: Secondary | ICD-10-CM | POA: Diagnosis not present

## 2024-05-28 DIAGNOSIS — M1990 Unspecified osteoarthritis, unspecified site: Secondary | ICD-10-CM | POA: Diagnosis not present

## 2024-05-28 DIAGNOSIS — E785 Hyperlipidemia, unspecified: Secondary | ICD-10-CM | POA: Diagnosis not present

## 2024-05-28 LAB — BASIC METABOLIC PANEL WITH GFR
Anion gap: 13 (ref 5–15)
BUN: 16 mg/dL (ref 8–23)
CO2: 27 mmol/L (ref 22–32)
Calcium: 8.5 mg/dL — ABNORMAL LOW (ref 8.9–10.3)
Chloride: 93 mmol/L — ABNORMAL LOW (ref 98–111)
Creatinine, Ser: 1.18 mg/dL — ABNORMAL HIGH (ref 0.44–1.00)
GFR, Estimated: 47 mL/min — ABNORMAL LOW (ref >60)
Glucose, Bld: 177 mg/dL — ABNORMAL HIGH (ref 70–99)
Potassium: 4 mmol/L (ref 3.5–5.1)
Sodium: 133 mmol/L — ABNORMAL LOW (ref 135–145)

## 2024-05-28 LAB — CBC
HCT: 28 % — ABNORMAL LOW (ref 36.0–46.0)
Hemoglobin: 9 g/dL — ABNORMAL LOW (ref 12.0–15.0)
MCH: 25.9 pg — ABNORMAL LOW (ref 26.0–34.0)
MCHC: 32.1 g/dL (ref 30.0–36.0)
MCV: 80.7 fL (ref 80.0–100.0)
Platelets: 326 K/uL (ref 150–400)
RBC: 3.47 MIL/uL — ABNORMAL LOW (ref 3.87–5.11)
RDW: 13.3 % (ref 11.5–15.5)
WBC: 7.4 K/uL (ref 4.0–10.5)
nRBC: 0 % (ref 0.0–0.2)

## 2024-05-28 LAB — GLUCOSE, CAPILLARY
Glucose-Capillary: 173 mg/dL — ABNORMAL HIGH (ref 70–99)
Glucose-Capillary: 231 mg/dL — ABNORMAL HIGH (ref 70–99)

## 2024-05-28 MED ORDER — ASPIRIN 325 MG PO TBEC: 325.0000 mg | DELAYED_RELEASE_TABLET | Freq: Every day | ORAL | Status: AC

## 2024-05-28 MED ORDER — METOPROLOL TARTRATE 50 MG PO TABS: 50.0000 mg | ORAL_TABLET | Freq: Two times a day (BID) | ORAL | 1 refills | Status: DC

## 2024-05-28 MED ORDER — GABAPENTIN 300 MG PO CAPS: 600.0000 mg | ORAL_CAPSULE | Freq: Two times a day (BID) | ORAL | Status: AC

## 2024-05-28 MED ORDER — TRESIBA FLEXTOUCH 200 UNIT/ML ~~LOC~~ SOPN: 15.0000 [IU] | PEN_INJECTOR | Freq: Every morning | SUBCUTANEOUS | Status: AC

## 2024-05-28 MED ORDER — OXYCODONE HCL 5 MG PO TABS: 5.0000 mg | ORAL_TABLET | Freq: Four times a day (QID) | ORAL | 0 refills | Status: AC | PRN

## 2024-05-28 NOTE — Care Management Important Message (Signed)
 Important Message  Patient Details  Name: Natalie Anderson MRN: [id number] Date of Birth: [birth year]   Important Message Given:  Yes - Medicare IM     [name] 05/28/2024, 12:48 PM

## 2024-05-28 NOTE — Plan of Care (Signed)
  Problem: Education: Goal: Understanding of CV disease, CV risk reduction, and recovery process will improve Outcome: Progressing Goal: Individualized Educational Video(s) Outcome: Progressing   Problem: Activity: Goal: Ability to return to baseline activity level will improve Outcome: Progressing   Problem: Cardiovascular: Goal: Ability to achieve and maintain adequate cardiovascular perfusion will improve Outcome: Progressing   Problem: Health Behavior/Discharge Planning: Goal: Ability to safely manage health-related needs after discharge will improve Outcome: Progressing   Problem: Education: Goal: Knowledge of General Education information will improve Description: Including pain rating scale, medication(s)/side effects and non-pharmacologic comfort measures Outcome: Progressing   Problem: Health Behavior/Discharge Planning: Goal: Ability to manage health-related needs will improve Outcome: Progressing   Problem: Clinical Measurements: Goal: Ability to maintain clinical measurements within normal limits will improve Outcome: Progressing Goal: Will remain free from infection Outcome: Progressing Goal: Diagnostic test results will improve Outcome: Progressing Goal: Respiratory complications will improve Outcome: Progressing Goal: Cardiovascular complication will be avoided Outcome: Progressing   Problem: Activity: Goal: Risk for activity intolerance will decrease Outcome: Progressing   Problem: Nutrition: Goal: Adequate nutrition will be maintained Outcome: Progressing   Problem: Elimination: Goal: Will not experience complications related to bowel motility Outcome: Progressing Goal: Will not experience complications related to urinary retention Outcome: Progressing   Problem: Pain Managment: Goal: General experience of comfort will improve and/or be controlled Outcome: Progressing   Problem: Safety: Goal: Ability to remain free from injury will  improve Outcome: Progressing   Problem: Skin Integrity: Goal: Risk for impaired skin integrity will decrease Outcome: Progressing   Problem: Education: Goal: Will demonstrate proper wound care and an understanding of methods to prevent future damage Outcome: Progressing Goal: Knowledge of disease or condition will improve Outcome: Progressing Goal: Knowledge of the prescribed therapeutic regimen will improve Outcome: Progressing Goal: Individualized Educational Video(s) Outcome: Progressing   Problem: Activity: Goal: Risk for activity intolerance will decrease Outcome: Progressing   Problem: Cardiac: Goal: Will achieve and/or maintain hemodynamic stability Outcome: Progressing   Problem: Clinical Measurements: Goal: Postoperative complications will be avoided or minimized Outcome: Progressing   Problem: Respiratory: Goal: Respiratory status will improve Outcome: Progressing   Problem: Skin Integrity: Goal: Wound healing without signs and symptoms of infection Outcome: Progressing Goal: Risk for impaired skin integrity will decrease Outcome: Progressing   Problem: Urinary Elimination: Goal: Ability to achieve and maintain adequate renal perfusion and functioning will improve Outcome: Progressing   Problem: Education: Goal: Ability to describe self-care measures that may prevent or decrease complications (Diabetes Survival Skills Education) will improve Outcome: Progressing Goal: Individualized Educational Video(s) Outcome: Progressing   Problem: Coping: Goal: Ability to adjust to condition or change in health will improve Outcome: Progressing   Problem: Fluid Volume: Goal: Ability to maintain a balanced intake and output will improve Outcome: Progressing   Problem: Health Behavior/Discharge Planning: Goal: Ability to identify and utilize available resources and services will improve Outcome: Progressing Goal: Ability to manage health-related needs will  improve Outcome: Progressing   Problem: Metabolic: Goal: Ability to maintain appropriate glucose levels will improve Outcome: Progressing   Problem: Nutritional: Goal: Maintenance of adequate nutrition will improve Outcome: Progressing Goal: Progress toward achieving an optimal weight will improve Outcome: Progressing   Problem: Skin Integrity: Goal: Risk for impaired skin integrity will decrease Outcome: Progressing   Problem: Tissue Perfusion: Goal: Adequacy of tissue perfusion will improve Outcome: Progressing

## 2024-05-28 NOTE — Progress Notes (Addendum)
 7 Days Post-Op Procedure(s) (LRB): OFF PUMP CORONARY ARTERY BYPASS GRAFTING X 2, USING LEFT INTERNAL MAMMARY ARTERY AND ENDOSCOPIC HARVESTED LEFT GREATER SAPHENOUS VEIN (N/A) Subjective: Feels sore, some tachy w/ activity but mostly well controlled HR  Objective: Vital signs in last 24 hours: Temp:  [98 F (36.7 C)-98.6 F (37 C)] 98.6 F (37 C) (11/04 0336) Pulse Rate:  [66-109] 66 (11/04 0336) Cardiac Rhythm: Sinus tachycardia (11/03 1900) Resp:  [18-20] 20 (11/04 0336) BP: (122-137)/(57-79) 130/79 (11/04 0336) SpO2:  [90 %-100 %] 90 % (11/04 0336)  Hemodynamic parameters for last 24 hours:    Intake/Output from previous day: 11/03 0701 - 11/04 0700 In: -  Out: 1 [Stool:1] Intake/Output this shift: No intake/output data recorded.  General appearance: alert, cooperative, and no distress Heart: regular rate and rhythm Lungs: clear to auscultation bilaterally Abdomen: benign Extremities: no edema Wound: incis healing well  Lab Results: Recent Labs    05/28/24 0251  WBC 7.4  HGB 9.0*  HCT 28.0*  PLT 326   BMET:  Recent Labs    05/28/24 0251  NA 133*  K 4.0  CL 93*  CO2 27  GLUCOSE 177*  BUN 16  CREATININE 1.18*  CALCIUM  8.5*    PT/INR: No results for input(s): LABPROT, INR in the last 72 hours. ABG    Component Value Date/Time   PHART 7.358 05/22/2024 1713   HCO3 20.2 05/22/2024 1713   TCO2 21 (L) 05/22/2024 1713   ACIDBASEDEF 5.0 (H) 05/22/2024 1713   O2SAT 97 05/22/2024 1713   CBG (last 3)  Recent Labs    05/27/24 1645 05/27/24 2110 05/28/24 0603  GLUCAP 148* 139* 173*    Meds Scheduled Meds:  amLODipine  5 mg Oral QHS   arformoterol  15 mcg Nebulization BID   aspirin EC  325 mg Oral Daily   enoxaparin (LOVENOX) injection  30 mg Subcutaneous Daily   feeding supplement  237 mL Oral BID BM   gabapentin   300 mg Oral BID   insulin  aspart  0-15 Units Subcutaneous TID WC   insulin  aspart  0-5 Units Subcutaneous QHS   metFORMIN    1,000 mg Oral BID WC   metoprolol tartrate  50 mg Oral BID   pantoprazole   40 mg Oral Daily   revefenacin  175 mcg Nebulization Daily   rosuvastatin  5 mg Oral Weekly   sodium chloride  flush  3 mL Intravenous Q12H   Continuous Infusions: PRN Meds:.docusate sodium, ipratropium-albuterol, lactulose, ondansetron  (ZOFRAN ) IV, mouth rinse, oxyCODONE , sodium chloride  flush, traMADol  Xrays No results found.  Assessment/Plan: S/P Procedure(s) (LRB): OFF PUMP CORONARY ARTERY BYPASS GRAFTING X 2, USING LEFT INTERNAL MAMMARY ARTERY AND ENDOSCOPIC HARVESTED LEFT GREATER SAPHENOUS VEIN (N/A)  POD#7  1 afeb, s BP 120's-130's, sinus rhythm/sinus tachy- cont norvasc, no ARB for now w/ recent renall AKI, cont current beta blocker 2 O2 sats good on RA 3 not weighed today- appears euvolemic 4 creat has significantly improved to 1.18, minor hypochloremia.hyponatremia- lasix has been stopped 5 H/H - improved trend w/ equilibration 6 BS well controlled- will need close monitoring at home as she resumes home meds dosing- will start at lower than home dose and she can adjust - she is familiar with adjusting insulin - currently not eating that much and has some mild occasional nausea 7 home equipment ordered 8 stable for discharge- reviewed instructions     LOS: 11 days    Lemond FORBES Cera PA-C Pager [phone] 05/28/2024  Dispo planning  Victorious Kundinger MALVA Rayas

## 2024-05-28 NOTE — TOC Transition Note (Signed)
 Transition of Care (TOC) - Discharge Note Rayfield Gobble RN, BSN Inpatient Care Management Unit 4E- RN Case Manager See Treatment Team for direct phone #   Patient Details  Name: Natalie Anderson MRN: [id number] Date of Birth: 10-05-1945  Transition of [facility]) CM/SW Contact:  Gobble Rayfield Hurst, RN Phone Number: 05/28/2024, 12:04 PM   Clinical Narrative:    Pt stable for transition home today s/p CABG, DME- RW and BSC have been delivered to pt.  HH has been set up under [facility] office referral to [facility]- liaison aware of discharge and will follow up for post procedure HH needs.   Pt's family to transport home.   IP CM interventions have been completed no further needs noted.   Final next level of care: Home w Home Health Services Barriers to Discharge: Barriers Resolved   Patient Goals and CMS Choice Patient states their goals for this hospitalization and ongoing recovery are:: Patient wants to get back home and drink her coffee ( usually likes to sit on front porch)   Choice offered to / list presented to : Patient      Discharge Placement               Home w/ [facility]        Discharge Plan and Services Additional resources added to the After Visit Summary for     Discharge Planning Services: CM Consult Post Acute Care Choice: Durable Medical Equipment, Home Health          DME Arranged: Walker rolling, Bedside commode DME Agency: [facility] Date DME Agency Contacted: 05/27/24 Time DME Agency Contacted: 1530 Representative spoke with at DME Agency: [facility] HH Arranged: RN, PT, OT [facility] Agency: [facility] Date HH Agency Contacted: 05/27/24 Time HH Agency Contacted: 1506 Representative spoke with at [facility] Agency: [facility]  Social Drivers of Health (SDOH) Interventions SDOH Screenings   Food Insecurity: No Food Insecurity (05/18/2024)  Housing: Low Risk  (05/18/2024)  Transportation Needs: No Transportation Needs (05/18/2024)  Utilities:  Not At Risk (05/18/2024)  Tobacco Use: Low Risk  (05/21/2024)     Readmission Risk Interventions    05/28/2024   12:03 PM  Readmission Risk Prevention Plan  Post Dischage Appt Complete  Medication Screening Complete  Transportation Screening Complete

## 2024-06-07 ENCOUNTER — Ambulatory Visit
Payer: Self-pay | Attending: Thoracic Surgery (Cardiothoracic Vascular Surgery) | Admitting: Thoracic Surgery (Cardiothoracic Vascular Surgery)

## 2024-06-07 DIAGNOSIS — Z951 Presence of aortocoronary bypass graft: Secondary | ICD-10-CM

## 2024-06-07 NOTE — Progress Notes (Signed)
     [address] [place] [address]             [phone]      Patient: Home Provider: Office Consent for Telemedicine visit obtained.  Today's visit was completed via a real-time telehealth (see specific modality noted below). The patient/authorized person provided oral consent at the time of the visit to engage in a telemedicine encounter with the present provider at [facility]. The patient/authorized person was informed of the potential benefits, limitations, and risks of telemedicine. The patient/authorized person expressed understanding that the laws that protect confidentiality also apply to telemedicine. The patient/authorized person acknowledged understanding that telemedicine does not provide emergency services and that he or she would need to call 911 or proceed to the nearest hospital for help if such a need arose.   Total time spent in the clinical discussion 10 minutes.  Telehealth Modality: Phone visit (audio only)  I had a telephone visit with  Natalie Anderson who is s/p CABG.  Overall doing well.  Pain is minimal.  Ambulating well. Vitals have been stable.  Natalie Anderson will see us  back in 1 month with a chest x-ray for cardiac rehab clearance.  Calil Amor MALVA Rayas

## 2024-06-19 DIAGNOSIS — E1165 Type 2 diabetes mellitus with hyperglycemia: Secondary | ICD-10-CM | POA: Diagnosis not present

## 2024-06-24 DIAGNOSIS — I10 Essential (primary) hypertension: Secondary | ICD-10-CM | POA: Diagnosis not present

## 2024-06-24 DIAGNOSIS — R52 Pain, unspecified: Secondary | ICD-10-CM | POA: Diagnosis not present

## 2024-06-24 DIAGNOSIS — E119 Type 2 diabetes mellitus without complications: Secondary | ICD-10-CM | POA: Diagnosis not present

## 2024-06-24 DIAGNOSIS — I2510 Atherosclerotic heart disease of native coronary artery without angina pectoris: Secondary | ICD-10-CM | POA: Diagnosis not present

## 2024-06-24 DIAGNOSIS — Z299 Encounter for prophylactic measures, unspecified: Secondary | ICD-10-CM | POA: Diagnosis not present

## 2024-06-24 DIAGNOSIS — J449 Chronic obstructive pulmonary disease, unspecified: Secondary | ICD-10-CM | POA: Diagnosis not present

## 2024-06-27 ENCOUNTER — Telehealth: Payer: Self-pay | Admitting: Cardiology

## 2024-06-27 NOTE — Telephone Encounter (Signed)
 Pt's Sister in law called to cancel appt tomorrow and rescheduled for 07/17/24. She wanted Dr Alvan nurse to see if that appt was ok to wait until then. Please Advise

## 2024-06-27 NOTE — Telephone Encounter (Signed)
Patient's sister informed and verbalized understanding of plan.

## 2024-06-28 ENCOUNTER — Ambulatory Visit: Admitting: Nurse Practitioner

## 2024-07-05 ENCOUNTER — Other Ambulatory Visit: Payer: Self-pay | Admitting: Thoracic Surgery (Cardiothoracic Vascular Surgery)

## 2024-07-05 DIAGNOSIS — Z951 Presence of aortocoronary bypass graft: Secondary | ICD-10-CM

## 2024-07-08 ENCOUNTER — Inpatient Hospital Stay (HOSPITAL_COMMUNITY): Admission: RE | Admit: 2024-07-08 | Discharge: 2024-07-08 | Attending: Cardiology

## 2024-07-08 ENCOUNTER — Ambulatory Visit: Payer: Self-pay

## 2024-07-08 VITALS — BP 149/64 | HR 86 | Resp 18 | Ht 62.0 in | Wt 139.0 lb

## 2024-07-08 DIAGNOSIS — Z951 Presence of aortocoronary bypass graft: Secondary | ICD-10-CM | POA: Insufficient documentation

## 2024-07-08 NOTE — Progress Notes (Signed)
 [address] [place] [address]             [phone]       HPI:  Patient returns for routine postoperative follow-up having undergone Off pump CABG X 2.  LIMA LAD, RSVG distal RCA   Endoscopic greater saphenous vein harvest on the left on 05/21/2024 with Dr. Shyrl.  Since hospital discharge the patient reports that she has been doing well.  Her pain is controlled by using tylenol  twice a day and taking half an oxycodone  at night.  She has been very active with walking and doing her home physical therapy. Her incision site has been healing appropriately. She denies chest pain, shortness of breath and lower leg swelling.    Allergies as of 07/08/2024       Reactions   Shellfish Allergy Shortness Of Breath   Shrimp allergy   Latex Rash   Blisters   Morphine  And Codeine Itching, Nausea And Vomiting   Other Hives, Rash   Strawberries and oranges   Amoxicillin Other (See Comments)   Caused mouth sores, and Gastric issues    Prednisone    Makes me bleed out of my mouth   Nickel Rash   Penicillins Hives        Medication List        Accurate as of July 08, 2024  1:43 PM. If you have any questions, ask your nurse or doctor.          albuterol  108 (90 Base) MCG/ACT inhaler Commonly known as: VENTOLIN  HFA Inhale 2 puffs into the lungs every 6 (six) hours as needed for wheezing or shortness of breath.   amLODipine  5 MG tablet Commonly known as: NORVASC  Take 5 mg by mouth at bedtime.   aspirin  EC 325 MG tablet Take 1 tablet (325 mg total) by mouth daily.   fluticasone  50 MCG/ACT nasal spray Commonly known as: FLONASE  Place 1 spray into both nostrils in the morning and at bedtime.   gabapentin  300 MG capsule Commonly known as: NEURONTIN  Take 2 capsules (600 mg total) by mouth 2 (two) times daily.   loratadine  10 MG tablet Commonly known as: CLARITIN  Take 10 mg by mouth daily.   metFORMIN  500 MG tablet Commonly known as:  GLUCOPHAGE  Take 1,000 mg by mouth 2 (two) times daily with a meal.   metoprolol  tartrate 50 MG tablet Commonly known as: LOPRESSOR  Take 1 tablet (50 mg total) by mouth 2 (two) times daily.   Mounjaro  2.5 MG/0.5ML Pen Generic drug: tirzepatide  Inject 2.5 mg into the skin once a week. Fridays   pantoprazole  20 MG tablet Commonly known as: PROTONIX  Take 1 tablet (20 mg total) by mouth daily.   rosuvastatin  5 MG tablet Commonly known as: CRESTOR  Take 5 mg by mouth once a week. Wednesdays   Tresiba  FlexTouch 200 UNIT/ML FlexTouch Pen Generic drug: insulin  degludec Inject 16 Units into the skin in the morning.   Vitamin D -3 125 MCG (5000 UT) Tabs Take 5,000 Units by mouth at bedtime.         ROS Review of Systems  Constitutional:  Negative for malaise/fatigue.  Respiratory:  Negative for cough and shortness of breath.   Cardiovascular:  Negative for chest pain, palpitations and leg swelling.      BP (!) 149/64 (BP Location: Right Arm)   Pulse 86   Resp 18   Ht 5' 2 (1.575 m)   Wt 139  lb (63 kg)   SpO2 97%   BMI 25.42 kg/m   Physical Exam Constitutional:      Appearance: Normal appearance.  HENT:     Head: Normocephalic and atraumatic.  Cardiovascular:     Rate and Rhythm: Normal rate and regular rhythm.     Heart sounds: Normal heart sounds, S1 normal and S2 normal.  Pulmonary:     Effort: Pulmonary effort is normal.     Breath sounds: Normal breath sounds.  Musculoskeletal:     Cervical back: Normal range of motion.  Skin:    General: Skin is warm and dry.      Neurological:     General: No focal deficit present.     Mental Status: She is alert.       Imaging: EXAM: 2 VIEW(S) XRAY OF THE CHEST 07/08/2024 12:57:00 PM   COMPARISON: 05/26/2024   CLINICAL HISTORY: s/p cabg   FINDINGS:   LUNGS AND PLEURA: Mild scar versus atelectasis noted within the lung bases. Interval resolution of small bilateral pleural effusions. No pneumothorax.    HEART AND MEDIASTINUM: CABG changes. Sternotomy wires in place.   BONES AND SOFT TISSUES: Old healed left lateral rib fractures. Thoracic spondylosis. Osteopenia.   IMPRESSION: 1. Interval resolution of small bilateral pleural effusions. 2. Mild basilar atelectasis or scarring.   Electronically signed by: Waddell Calk MD 07/08/2024 01:26 PM EST RP Workstation: [id number]   Assessment/Plan: S/P CABG x 2 -We reviewed today's chest x ray which showed resolution of small bilateral pleural effusions -We discussed driving and she is able to start. First time driving should be a short distance in the day time and she can increase from there -She is cleared to participate in cardiac rehab -Discussed the continuation of sternal precautions until a full 6 weeks from surgery. She should continue to not lift over 10 pounds until a full 3 months from surgery -She is to increase her activity as tolerated -She has follow up with cardiology on 07/17/2024 -Follow up with [facility] as needed   Manuelita CHRISTELLA Rough, PA-C 1:43 PM 07/08/2024

## 2024-07-08 NOTE — Patient Instructions (Signed)

## 2024-07-14 ENCOUNTER — Other Ambulatory Visit: Payer: Self-pay | Admitting: Surgical

## 2024-07-17 ENCOUNTER — Encounter: Payer: Self-pay | Admitting: Cardiology

## 2024-07-17 ENCOUNTER — Ambulatory Visit: Attending: Cardiology | Admitting: Cardiology

## 2024-07-17 VITALS — BP 136/84 | HR 78 | Ht 62.5 in | Wt 137.6 lb

## 2024-07-17 DIAGNOSIS — E782 Mixed hyperlipidemia: Secondary | ICD-10-CM

## 2024-07-17 DIAGNOSIS — Z136 Encounter for screening for cardiovascular disorders: Secondary | ICD-10-CM | POA: Diagnosis not present

## 2024-07-17 DIAGNOSIS — I2510 Atherosclerotic heart disease of native coronary artery without angina pectoris: Secondary | ICD-10-CM

## 2024-07-17 NOTE — Progress Notes (Signed)
 "     Clinical Summary Natalie Anderson is a 78 y.o.female seen today for follow up of the following medical problems.   1.SOB/Chest pain/CAD - episode about 5-6 weeks ago - was walking in her car port. Mid to left chest, cutting like pain 10/10. +SOB. Lasted about 15-20 minutes.  - since that time reoccuring pain daily, typically with activity. Significant DOE.  - CAD risk factors: DM2, HTN, HLD     - 03/2024 echo with pcp: LVEF 72%, no WMAs, grade I dd, no significant valve pathology -04/2024 nuclear stress at [facility] ordered by pcp (in care everywhere): large severe intensity partially reersible apical, apical septal, apical anterior, mid anterior, mid anteroseptal defect. Mild TID     04/2024 cath: mid LM 30%, ostial LAD 99%, prox to mid LAD 95%, LCX patent, ostial RCA 60%, prox 70%. -disease not favorable for PCI, referred fo CABG - 05/21/24 CABG LIMA-LAD, RSVG to distal RCA.    - some chest soreness at times at surgical site. No specific cardiac chest pains. Walking regularly without exertional.    2.PAD - pre CABG ABIs bilateral moderate diseae  3. HLD - taking crestor  5mg  just once weekly. More regularly causes leg weakness - 04/2024 TC 217 TG 169 HDL 44 LDL 139 Past Medical History:  Diagnosis Date   Arthritis    Asthma    as a child   Blood transfusion 07/25/1977   Cataract immature    bilateral   Chronic back pain    spondylosis and stenosis   Complication of anesthesia    stopped breathing after anesthesia   COPD (chronic obstructive pulmonary disease) (HCC)    Diabetes mellitus    takes Metformin  bid and Levimir bid and Victoza daily   Dizziness    occasionally   Dry skin    and itchy    Fibroid tumor    GERD (gastroesophageal reflux disease)    takes Protonix  daily   History of shingles 15-49yrs ago   Hyperlipidemia    takes Lipitor occasionally   Hypertension    takes Losartan  daily   Impaired hearing    Joint pain    Joint swelling    Migraine     last one 3 wks ago   Nocturia    Osteoporosis    Osteoporosis    Pneumonia    hx of-double as a child   PONV (postoperative nausea and vomiting)    Seasonal allergies    takes Claritin  daily   Shortness of breath    with exertion   Urinary frequency    UTI (lower urinary tract infection)    hx of   Vertigo    hx of   Vitamin D  deficiency    takes Vit D 50,000units on Sat     Allergies[1]   Current Outpatient Medications  Medication Sig Dispense Refill   albuterol  (PROVENTIL  HFA;VENTOLIN  HFA) 108 (90 BASE) MCG/ACT inhaler Inhale 2 puffs into the lungs every 6 (six) hours as needed for wheezing or shortness of breath.     amLODipine  (NORVASC ) 5 MG tablet Take 5 mg by mouth at bedtime.     aspirin  EC 325 MG tablet Take 1 tablet (325 mg total) by mouth daily.     Cholecalciferol  (VITAMIN D -3) 125 MCG (5000 UT) TABS Take 5,000 Units by mouth at bedtime.     fluticasone  (FLONASE ) 50 MCG/ACT nasal spray Place 1 spray into both nostrils in the morning and at bedtime.     gabapentin  (NEURONTIN )  300 MG capsule Take 2 capsules (600 mg total) by mouth 2 (two) times daily.     loratadine  (CLARITIN ) 10 MG tablet Take 10 mg by mouth daily.     metFORMIN  (GLUCOPHAGE ) 500 MG tablet Take 1,000 mg by mouth 2 (two) times daily with a meal.     metoprolol  tartrate (LOPRESSOR ) 50 MG tablet TAKE 1 TABLET BY MOUTH TWICE DAILY 60 tablet 1   MOUNJARO  2.5 MG/0.5ML Pen Inject 2.5 mg into the skin once a week. Fridays     pantoprazole  (PROTONIX ) 20 MG tablet Take 1 tablet (20 mg total) by mouth daily. 30 tablet 11   rosuvastatin  (CRESTOR ) 5 MG tablet Take 5 mg by mouth once a week. Wednesdays     TRESIBA  FLEXTOUCH 200 UNIT/ML FlexTouch Pen Inject 16 Units into the skin in the morning.     No current facility-administered medications for this visit.     Past Surgical History:  Procedure Laterality Date   BACK SURGERY  12/24/2010   x2   CATARACT EXTRACTION W/PHACO Right 04/05/2021   Procedure:  CATARACT EXTRACTION PHACO AND INTRAOCULAR LENS PLACEMENT (IOC);  Surgeon: Harrie Agent, MD;  Location: [facility];  Service: Ophthalmology;  Laterality: Right;  CDE 5.98   CATARACT EXTRACTION W/PHACO Left 04/19/2021   Procedure: CATARACT EXTRACTION PHACO AND INTRAOCULAR LENS PLACEMENT LEFT EYE;  Surgeon: Harrie Agent, MD;  Location: [facility];  Service: Ophthalmology;  Laterality: Left;  left CDE=5.90   COLONOSCOPY WITH PROPOFOL  N/A 12/09/2021   Surgeon: Shaaron Lamar HERO, MD; Six 3 to 9 mm polyps removed and 1 hemostasis clip placed.  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  Recommended repeat in 3 years.   CORONARY ARTERY BYPASS GRAFT N/A 05/21/2024   Procedure: OFF PUMP CORONARY ARTERY BYPASS GRAFTING X 2, USING LEFT INTERNAL MAMMARY ARTERY AND ENDOSCOPIC HARVESTED LEFT GREATER SAPHENOUS VEIN;  Surgeon: Shyrl Linnie KIDD, MD;  Location: [facility];  Service: Open Heart Surgery;  Laterality: N/A;   DILATION AND CURETTAGE OF UTERUS  07/25/1977   ESOPHAGOGASTRODUODENOSCOPY (EGD) WITH PROPOFOL  N/A 12/09/2021   Surgeon: Shaaron Lamar HERO, MD; Moderate Schatzki ring. Dilated. Small hiatal hernia.   HEMOSTASIS CLIP PLACEMENT  12/09/2021   Procedure: HEMOSTASIS CLIP PLACEMENT;  Surgeon: Shaaron Lamar HERO, MD;  Location: [facility];  Service: Endoscopy;;   LEFT HEART CATH AND CORONARY ANGIOGRAPHY N/A 05/17/2024   Procedure: LEFT HEART CATH AND CORONARY ANGIOGRAPHY;  Surgeon: Anner Alm ORN, MD;  Location: [facility] INVASIVE CV LAB;  Service: Cardiovascular;  Laterality: N/A;   MALONEY DILATION N/A 12/09/2021   Procedure: AGAPITO DILATION;  Surgeon: Shaaron Lamar HERO, MD;  Location: [facility];  Service: Endoscopy;  Laterality: N/A;   POLYPECTOMY  12/09/2021   Procedure: POLYPECTOMY;  Surgeon: Shaaron Lamar HERO, MD;  Location: [facility];  Service: Endoscopy;;     Allergies[2]    Family History  Problem Relation Age of Onset   Breast cancer Mother    Lung cancer Mother     Colon polyps Sister    Colon cancer Paternal Grandmother        78   Colon cancer Cousin 19   Colon cancer Cousin 19   Colon cancer Cousin 21   Lung cancer Brother    Colon cancer Nephew 35   Colon cancer Niece 63   Anesthesia problems Neg Hx    Hypotension Neg Hx    Malignant hyperthermia Neg Hx    Pseudochol deficiency Neg Hx  Social History Natalie Anderson reports that she has never smoked. She has never used smokeless tobacco. Natalie Anderson reports no history of alcohol  use.    Physical Examination Today's Vitals   07/17/24 0952  BP: 136/84  Pulse: 78  SpO2: 98%  Weight: 137 lb 9.6 oz (62.4 kg)  Height: 5' 2.5 (1.588 m)   Body mass index is 24.77 kg/m.  Gen: resting comfortably, no acute distress HEENT: no scleral icterus, pupils equal round and reactive, no palptable cervical adenopathy,  CV: RRR, no mrg, no jvd Resp: Clear to auscultation bilaterally GI: abdomen is soft, non-tender, non-distended, normal bowel sounds, no hepatosplenomegaly MSK: extremities are warm, no edema.  Skin: warm, no rash Neuro:  no focal deficits Psych: appropriate affect     Assessment and Plan 1 1.CAD - s/p CABG, overall doing well since surgery. Some soreness at surgery site at time - continue current meds  2. HLD - repeat lipid panel - some limitations in statin tolerance, just taking crestor  5mg  once weeks. - pending lipid panel consider potentially adding zeta vs [id number] vs possibly attempt at using pravstatin. May refer to pharmD lipid clinic    F/u 4 months   Dorn PHEBE Ross, M.D.     [1]  Allergies Allergen Reactions   Shellfish Allergy Shortness Of Breath    Shrimp allergy   Latex Rash    Blisters    Morphine  And Codeine Itching and Nausea And Vomiting   Other Hives and Rash    Strawberries and oranges    Amoxicillin Other (See Comments)    Caused mouth sores, and Gastric issues    Prednisone     Makes me bleed out of my mouth   Nickel Rash    Penicillins Hives  [2]  Allergies Allergen Reactions   Shellfish Allergy Shortness Of Breath    Shrimp allergy   Latex Rash    Blisters    Morphine  And Codeine Itching and Nausea And Vomiting   Other Hives and Rash    Strawberries and oranges    Amoxicillin Other (See Comments)    Caused mouth sores, and Gastric issues    Prednisone     Makes me bleed out of my mouth   Nickel Rash   Penicillins Hives   "

## 2024-07-17 NOTE — Patient Instructions (Signed)
 Medication Instructions:  Your physician recommends that you continue on your current medications as directed. Please refer to the Current Medication list given to you today.  *If you need a refill on your cardiac medications before your next appointment, please call your pharmacy*  Lab Work: Fasting Lipid Panel- nothing to eat/drink at least 6 hours prior to lab work. If you have labs (blood work) drawn today and your tests are completely normal, you will receive your results only by: MyChart Message (if you have MyChart) OR A paper copy in the mail If you have any lab test that is abnormal or we need to change your treatment, we will call you to review the results.  Testing/Procedures: None  Follow-Up: At [facility], you and your health needs are our priority.  As part of our continuing mission to provide you with exceptional heart care, our providers are all part of one team.  This team includes your primary Cardiologist (physician) and Advanced Practice Providers or APPs (Physician Assistants and Nurse Practitioners) who all work together to provide you with the care you need, when you need it.  Your next appointment:   4 month(s)  Provider:   You may see Alvan Carrier, MD or the following Advanced Practice Provider on your designated Care Team:   Almarie Crate, NP    We recommend signing up for the patient portal called MyChart.  Sign up information is provided on this After Visit Summary.  MyChart is used to connect with patients for Virtual Visits (Telemedicine).  Patients are able to view lab/test results, encounter notes, upcoming appointments, etc.  Non-urgent messages can be sent to your provider as well.   To learn more about what you can do with MyChart, go to [email or web address].   Other Instructions Thank you for choosing [facility]!

## 2024-07-24 ENCOUNTER — Encounter: Payer: Self-pay | Admitting: Internal Medicine

## 2024-08-13 ENCOUNTER — Ambulatory Visit: Payer: Self-pay | Admitting: Cardiology

## 2024-08-13 MED ORDER — PRAVASTATIN SODIUM 20 MG PO TABS: 20.0000 mg | ORAL_TABLET | Freq: Every evening | ORAL | 3 refills | Status: AC

## 2024-08-13 NOTE — Telephone Encounter (Signed)
-----   Message from Dorn Ross, MD sent at 08/13/2024 12:23 PM EST ----- Cholesterol too high, can she stop crestor  and try starting pravastatin  20mg  daily. If tolerates would titrate over time  JINNY Ross MD

## 2024-08-13 NOTE — Telephone Encounter (Signed)
 The patient has been notified of the result and verbalized understanding.  All questions (if any) were answered. Bernett Dorothyann LABOR, RN 08/13/2024 1:03 PM  Rx sent to [facility]

## 2024-09-02 ENCOUNTER — Ambulatory Visit: Admitting: Gastroenterology

## 2024-11-11 ENCOUNTER — Ambulatory Visit: Admitting: Nurse Practitioner
